# Patient Record
Sex: Male | Born: 1940 | Race: Black or African American | Hispanic: No | Marital: Married | Smoking: Former smoker
Health system: Southern US, Community
[De-identification: ages and names within clinical notes are randomized; demographics above are authoritative.]

## PROBLEM LIST (undated history)

## (undated) DIAGNOSIS — D649 Anemia, unspecified: Secondary | ICD-10-CM

## (undated) DIAGNOSIS — J189 Pneumonia, unspecified organism: Secondary | ICD-10-CM

## (undated) DIAGNOSIS — G4730 Sleep apnea, unspecified: Secondary | ICD-10-CM

## (undated) DIAGNOSIS — I499 Cardiac arrhythmia, unspecified: Secondary | ICD-10-CM

## (undated) DIAGNOSIS — I509 Heart failure, unspecified: Secondary | ICD-10-CM

## (undated) DIAGNOSIS — Z95810 Presence of automatic (implantable) cardiac defibrillator: Secondary | ICD-10-CM

## (undated) DIAGNOSIS — K219 Gastro-esophageal reflux disease without esophagitis: Secondary | ICD-10-CM

## (undated) DIAGNOSIS — Z950 Presence of cardiac pacemaker: Secondary | ICD-10-CM

## (undated) DIAGNOSIS — N189 Chronic kidney disease, unspecified: Secondary | ICD-10-CM

## (undated) HISTORY — PX: PERIPHERALLY INSERTED CENTRAL CATHETER INSERTION: SHX2221

## (undated) HISTORY — PX: INSERT / REPLACE / REMOVE PACEMAKER: SUR710

## (undated) HISTORY — PX: COLON SURGERY: SHX602

---

## 2012-02-12 DIAGNOSIS — I509 Heart failure, unspecified: Secondary | ICD-10-CM

## 2012-02-13 DIAGNOSIS — I5023 Acute on chronic systolic (congestive) heart failure: Secondary | ICD-10-CM

## 2012-02-14 ENCOUNTER — Encounter (HOSPITAL_COMMUNITY): Payer: Self-pay | Admitting: *Deleted

## 2012-02-14 ENCOUNTER — Encounter (HOSPITAL_COMMUNITY)
Admission: EM | Disposition: A | Discharge status: Home Health | Payer: Self-pay | Source: Other Acute Inpatient Hospital | Attending: Cardiology

## 2012-02-14 ENCOUNTER — Other Ambulatory Visit: Payer: Self-pay | Admitting: Physician Assistant

## 2012-02-14 ENCOUNTER — Inpatient Hospital Stay (HOSPITAL_COMMUNITY): Payer: Medicare Other

## 2012-02-14 ENCOUNTER — Inpatient Hospital Stay (HOSPITAL_COMMUNITY)
Admission: EM | Admit: 2012-02-14 | Discharge: 2012-02-22 | DRG: 237 | Disposition: A | Discharge status: Home Health | Payer: Medicare Other | Source: Other Acute Inpatient Hospital | Attending: Cardiology | Admitting: Cardiology

## 2012-02-14 DIAGNOSIS — I509 Heart failure, unspecified: Secondary | ICD-10-CM

## 2012-02-14 DIAGNOSIS — I5189 Other ill-defined heart diseases: Secondary | ICD-10-CM | POA: Diagnosis present

## 2012-02-14 DIAGNOSIS — R570 Cardiogenic shock: Secondary | ICD-10-CM

## 2012-02-14 DIAGNOSIS — R791 Abnormal coagulation profile: Secondary | ICD-10-CM | POA: Diagnosis present

## 2012-02-14 DIAGNOSIS — I472 Ventricular tachycardia, unspecified: Secondary | ICD-10-CM | POA: Diagnosis present

## 2012-02-14 DIAGNOSIS — I5023 Acute on chronic systolic (congestive) heart failure: Principal | ICD-10-CM

## 2012-02-14 DIAGNOSIS — I428 Other cardiomyopathies: Secondary | ICD-10-CM | POA: Diagnosis present

## 2012-02-14 DIAGNOSIS — D509 Iron deficiency anemia, unspecified: Secondary | ICD-10-CM | POA: Diagnosis present

## 2012-02-14 DIAGNOSIS — Z7901 Long term (current) use of anticoagulants: Secondary | ICD-10-CM

## 2012-02-14 DIAGNOSIS — I4891 Unspecified atrial fibrillation: Secondary | ICD-10-CM

## 2012-02-14 DIAGNOSIS — Z7982 Long term (current) use of aspirin: Secondary | ICD-10-CM

## 2012-02-14 DIAGNOSIS — I079 Rheumatic tricuspid valve disease, unspecified: Secondary | ICD-10-CM | POA: Diagnosis present

## 2012-02-14 DIAGNOSIS — K219 Gastro-esophageal reflux disease without esophagitis: Secondary | ICD-10-CM | POA: Diagnosis present

## 2012-02-14 DIAGNOSIS — Z95810 Presence of automatic (implantable) cardiac defibrillator: Secondary | ICD-10-CM

## 2012-02-14 DIAGNOSIS — I4729 Other ventricular tachycardia: Secondary | ICD-10-CM | POA: Diagnosis present

## 2012-02-14 DIAGNOSIS — Z79899 Other long term (current) drug therapy: Secondary | ICD-10-CM

## 2012-02-14 DIAGNOSIS — N182 Chronic kidney disease, stage 2 (mild): Secondary | ICD-10-CM | POA: Diagnosis present

## 2012-02-14 HISTORY — PX: RIGHT HEART CATHETERIZATION: SHX5447

## 2012-02-14 HISTORY — DX: Chronic kidney disease, unspecified: N18.9

## 2012-02-14 HISTORY — DX: Presence of cardiac pacemaker: Z95.0

## 2012-02-14 HISTORY — DX: Presence of automatic (implantable) cardiac defibrillator: Z95.810

## 2012-02-14 HISTORY — DX: Gastro-esophageal reflux disease without esophagitis: K21.9

## 2012-02-14 HISTORY — DX: Anemia, unspecified: D64.9

## 2012-02-14 HISTORY — DX: Cardiac arrhythmia, unspecified: I49.9

## 2012-02-14 LAB — CBC WITH DIFFERENTIAL/PLATELET
Basophils Relative: 0 % (ref 0–1)
HCT: 35.2 % — ABNORMAL LOW (ref 39.0–52.0)
Hemoglobin: 12.2 g/dL — ABNORMAL LOW (ref 13.0–17.0)
Lymphs Abs: 1.9 10*3/uL (ref 0.7–4.0)
MCH: 30.1 pg (ref 26.0–34.0)
MCHC: 34.7 g/dL (ref 30.0–36.0)
Monocytes Absolute: 1 10*3/uL (ref 0.1–1.0)
Monocytes Relative: 14 % — ABNORMAL HIGH (ref 3–12)
Neutro Abs: 3.9 10*3/uL (ref 1.7–7.7)

## 2012-02-14 LAB — CARBOXYHEMOGLOBIN
Carboxyhemoglobin: 1 % (ref 0.5–1.5)
Carboxyhemoglobin: 1.8 % — ABNORMAL HIGH (ref 0.5–1.5)
Methemoglobin: 0.9 % (ref 0.0–1.5)
O2 Saturation: 27.4 %
O2 Saturation: 74.1 %

## 2012-02-14 LAB — COMPREHENSIVE METABOLIC PANEL
Albumin: 3.1 g/dL — ABNORMAL LOW (ref 3.5–5.2)
BUN: 49 mg/dL — ABNORMAL HIGH (ref 6–23)
Chloride: 94 mEq/L — ABNORMAL LOW (ref 96–112)
Creatinine, Ser: 1.46 mg/dL — ABNORMAL HIGH (ref 0.50–1.35)
GFR calc Af Amer: 55 mL/min — ABNORMAL LOW (ref >90)
GFR calc non Af Amer: 47 mL/min — ABNORMAL LOW (ref >90)
Glucose, Bld: 106 mg/dL — ABNORMAL HIGH (ref 70–99)
Total Bilirubin: 1.6 mg/dL — ABNORMAL HIGH (ref 0.3–1.2)

## 2012-02-14 LAB — MAGNESIUM: Magnesium: 1.5 mg/dL (ref 1.5–2.5)

## 2012-02-14 LAB — PRO B NATRIURETIC PEPTIDE: Pro B Natriuretic peptide (BNP): 22343 pg/mL — ABNORMAL HIGH (ref 0–125)

## 2012-02-14 LAB — MRSA PCR SCREENING: MRSA by PCR: NEGATIVE

## 2012-02-14 LAB — TYPE AND SCREEN

## 2012-02-14 SURGERY — RIGHT HEART CATH
Anesthesia: LOCAL

## 2012-02-14 MED ORDER — LIDOCAINE HCL (PF) 1 % IJ SOLN
INTRAMUSCULAR | Status: AC
  Filled 2012-02-14: qty 30

## 2012-02-14 MED ORDER — SODIUM CHLORIDE 0.9 % IJ SOLN: 3.0000 mL | Freq: Two times a day (BID) | INTRAMUSCULAR | Status: DC

## 2012-02-14 MED ORDER — DIGOXIN 125 MCG PO TABS
0.1250 mg | ORAL_TABLET | Freq: Every day | ORAL | Status: DC
  Administered 2012-02-14 – 2012-02-22 (×9): 0.125 mg via ORAL
  Filled 2012-02-14 (×9): qty 1

## 2012-02-14 MED ORDER — FUROSEMIDE 10 MG/ML IJ SOLN: 40.0000 mg | Freq: Two times a day (BID) | INTRAMUSCULAR | Status: DC

## 2012-02-14 MED ORDER — ONDANSETRON HCL 4 MG/2ML IJ SOLN
4.0000 mg | Freq: Four times a day (QID) | INTRAMUSCULAR | Status: DC | PRN
  Administered 2012-02-19: 4 mg via INTRAVENOUS
  Filled 2012-02-14: qty 2

## 2012-02-14 MED ORDER — SODIUM CHLORIDE 0.9 % IV SOLN: 250.0000 mL | INTRAVENOUS | Status: DC | PRN

## 2012-02-14 MED ORDER — DIAZEPAM 5 MG PO TABS: 5.0000 mg | ORAL_TABLET | ORAL | Status: DC

## 2012-02-14 MED ORDER — SODIUM CHLORIDE 0.9 % IJ SOLN
3.0000 mL | Freq: Two times a day (BID) | INTRAMUSCULAR | Status: DC
  Administered 2012-02-16 – 2012-02-21 (×3): 3 mL via INTRAVENOUS

## 2012-02-14 MED ORDER — SODIUM CHLORIDE 0.9 % IJ SOLN: 3.0000 mL | INTRAMUSCULAR | Status: DC | PRN

## 2012-02-14 MED ORDER — ASPIRIN 81 MG PO CHEW: 324.0000 mg | CHEWABLE_TABLET | ORAL | Status: DC

## 2012-02-14 MED ORDER — PANTOPRAZOLE SODIUM 40 MG PO TBEC
40.0000 mg | DELAYED_RELEASE_TABLET | Freq: Every day | ORAL | Status: DC
  Administered 2012-02-15 – 2012-02-21 (×7): 40 mg via ORAL
  Filled 2012-02-14 (×8): qty 1

## 2012-02-14 MED ORDER — HEPARIN (PORCINE) IN NACL 100-0.45 UNIT/ML-% IJ SOLN
1100.0000 [IU]/h | INTRAMUSCULAR | Status: DC
  Administered 2012-02-14: 500 [IU]/h via INTRAVENOUS
  Filled 2012-02-14 (×2): qty 250

## 2012-02-14 MED ORDER — URSODIOL 300 MG PO CAPS
300.0000 mg | ORAL_CAPSULE | Freq: Two times a day (BID) | ORAL | Status: DC
  Administered 2012-02-14 – 2012-02-22 (×16): 300 mg via ORAL
  Filled 2012-02-14 (×18): qty 1

## 2012-02-14 MED ORDER — ACETAMINOPHEN 325 MG PO TABS: 650.0000 mg | ORAL_TABLET | ORAL | Status: DC | PRN

## 2012-02-14 MED ORDER — BENZONATATE 100 MG PO CAPS
100.0000 mg | ORAL_CAPSULE | Freq: Two times a day (BID) | ORAL | Status: DC | PRN
  Administered 2012-02-17: 100 mg via ORAL
  Filled 2012-02-14: qty 1

## 2012-02-14 MED ORDER — ACETAMINOPHEN 325 MG PO TABS
650.0000 mg | ORAL_TABLET | ORAL | Status: DC | PRN
  Administered 2012-02-15 – 2012-02-18 (×3): 650 mg via ORAL
  Filled 2012-02-14 (×2): qty 2

## 2012-02-14 MED ORDER — MILRINONE IN DEXTROSE 200-5 MCG/ML-% IV SOLN
0.2500 ug/kg/min | INTRAVENOUS | Status: DC
  Administered 2012-02-14 – 2012-02-21 (×11): 0.25 ug/kg/min via INTRAVENOUS
  Filled 2012-02-14 (×13): qty 100

## 2012-02-14 MED ORDER — ASPIRIN EC 81 MG PO TBEC: 81.0000 mg | DELAYED_RELEASE_TABLET | Freq: Every day | ORAL | Status: DC

## 2012-02-14 MED ORDER — ONDANSETRON HCL 4 MG/2ML IJ SOLN: 4.0000 mg | Freq: Four times a day (QID) | INTRAMUSCULAR | Status: DC | PRN

## 2012-02-14 MED ORDER — HEPARIN (PORCINE) IN NACL 2-0.9 UNIT/ML-% IJ SOLN
INTRAMUSCULAR | Status: AC
  Filled 2012-02-14: qty 3000

## 2012-02-14 MED ORDER — SODIUM CHLORIDE 0.9 % IV SOLN: 250.0000 mL | INTRAVENOUS | Status: DC

## 2012-02-14 MED ORDER — FUROSEMIDE 10 MG/ML IJ SOLN
80.0000 mg | Freq: Two times a day (BID) | INTRAMUSCULAR | Status: DC
  Administered 2012-02-14 – 2012-02-15 (×2): 80 mg via INTRAVENOUS
  Filled 2012-02-14 (×3): qty 8

## 2012-02-14 MED ORDER — SODIUM CHLORIDE 0.9 % IV SOLN: INTRAVENOUS | Status: DC

## 2012-02-14 MED ORDER — SENNA 8.6 MG PO TABS
1.0000 | ORAL_TABLET | Freq: Every day | ORAL | Status: DC
Start: 2012-02-15 — End: 2012-02-22
  Administered 2012-02-15 – 2012-02-22 (×8): 8.6 mg via ORAL
  Filled 2012-02-14 (×8): qty 1

## 2012-02-14 MED ORDER — MAGNESIUM OXIDE 400 (241.3 MG) MG PO TABS
400.0000 mg | ORAL_TABLET | Freq: Two times a day (BID) | ORAL | Status: DC
  Administered 2012-02-14 – 2012-02-22 (×16): 400 mg via ORAL
  Filled 2012-02-14 (×18): qty 1

## 2012-02-14 MED ORDER — HEPARIN SODIUM (PORCINE) 1000 UNIT/ML IJ SOLN
INTRAMUSCULAR | Status: AC
  Filled 2012-02-14: qty 1

## 2012-02-14 MED ORDER — FENOFIBRATE 145 MG PO TABS
145.0000 mg | ORAL_TABLET | Freq: Every day | ORAL | Status: DC
  Filled 2012-02-14: qty 1

## 2012-02-14 MED ORDER — PHYTONADIONE 5 MG PO TABS
5.0000 mg | ORAL_TABLET | Freq: Once | ORAL | Status: AC
  Administered 2012-02-14: 5 mg via ORAL
  Filled 2012-02-14: qty 1

## 2012-02-14 MED ORDER — FUROSEMIDE 10 MG/ML IJ SOLN
80.0000 mg | Freq: Two times a day (BID) | INTRAMUSCULAR | Status: DC
  Filled 2012-02-14: qty 8

## 2012-02-14 MED ORDER — ENOXAPARIN SODIUM 40 MG/0.4ML ~~LOC~~ SOLN
40.0000 mg | SUBCUTANEOUS | Status: DC
  Filled 2012-02-14: qty 0.4

## 2012-02-14 MED ORDER — ASPIRIN EC 81 MG PO TBEC
81.0000 mg | DELAYED_RELEASE_TABLET | Freq: Every day | ORAL | Status: DC
  Administered 2012-02-16 – 2012-02-22 (×7): 81 mg via ORAL
  Filled 2012-02-14 (×7): qty 1

## 2012-02-14 MED ORDER — AMIODARONE HCL 200 MG PO TABS
400.0000 mg | ORAL_TABLET | Freq: Every day | ORAL | Status: DC
  Administered 2012-02-15 – 2012-02-22 (×8): 400 mg via ORAL
  Filled 2012-02-14 (×8): qty 2

## 2012-02-14 NOTE — H&P (View-Only) (Signed)
  Patient seen and examined. Dr. Ival Bible not reviewed closely.   He has clear evidence of advanced HF with low output. Unable to stay awake during our discussion. C/o ab pain and nausea. SBP low 80s. Significant MR and S3 on exam.   We discussed options and both him and his wife would be interested in advanced therapies if he were a candidate.   Will plan RHC and possible IABP insertion today. Will likely also need coronary angio at some point when HF more stable.  INR 2.8 so will give vitamin K.   Truman Hayward 4:57 PM

## 2012-02-14 NOTE — H&P (Signed)
NAME:  DIO, GILLER [facility] ROOM: [id number]  UNIT NUMBER:  [id number] LOCATION: 41F [id number] 01 ADM/VISIT DATE:  02/12/2012   ADM Vaughan BrownerKathaleen Grinder:  [id number] DOB: March 05, 1941   REQUESTING PHYSICIAN:  Wende Crease, M.D. with the hospitalist service.  REASON FOR CONSULTATION:  Acute-on-chronic systolic heart failure.  HISTORY OF PRESENT ILLNESS:  Mr. Ramseyer is a 71 year old male, followed by Dr. Daryel November in [place], with a history of dilated nonischemic cardiomyopathy, reportedly diagnosed following evaluation at [facility] back in 2005.  He has no reported history of obstructive coronary artery disease, underwent interval placement of a Guidant defibrillator with reported istory of ventricular tachycardia, and has a left ventricular ejection fraction of approximately 20% to 25% with possibility of apical thrombus based on outside echocardiogram done just recently in June.  I have very limited information, however, it seems that he has been admitted to the hospital in [place] at least 4 or 5 times over the last few months with congestive heart failure exacerbations.  He reports compliance with his medications, tends to present with weakness and shortness of breath, mainly with ambulation.  He denies any chest pain, palpitations or device discharges.  Additional history includes atrial fibrillation which may be chronic, on Coumadin; and episodic acute-on-chronic renal failure, creatinine now 1.4.  He is now admitted to [facility] for the first time with heart failure exacerbation, and we are consulted to assist with his management.  I did review some available records from hospitalization in [place] in June.  Echocardiogram reported a left ventricular ejection fraction of 20% to 25% with possible apical thrombus, ICD lead in place in the right ventricle, moderate tricuspid regurgitation, RVSP not reported, no mention of significant mitral regurgitation.  Left ventricular end diastolic dimension was 59 mm, had  mild-to-moderate left atrial enlargement as well. Labwork from June in [place] showed creatinine up to 1.9 during hospitalization, down to 1.4.  He also had elevation in AST to 49, with normal ALT, supratherapeutic INR of 4.4.  Complicating the picture, are plans for the patient and his wife to move further [place] in [place] in the near future. Reportedly they have established with a cardiologist in that region.  ALLERGIES:  No reported drug allergies.  CURRENT MEDICAL REGIMEN: 1. Amiodarone 400 mg daily. 2. Fenofibrate 145 mg daily. 3. Magnesium oxide 400 mg twice daily. 4. Enteric coated aspirin 81 mg daily. 5. Lisinopril 5 mg daily. 6. Pantoprazole 40 mg daily. 7. Senokot S 1 tablet daily. 8. Lasix 40 mg IV twice daily. 9. Coumadin as per pharmacy.  PAST MEDICAL HISTORY:  Discussed above. 1. Atrial fibrillation may have initially been paroxysmal, seems to have been persistent recently.  Patient states that he has been on Coumadin for several years.   2. Baseline creatinine is in the 1.5 region. 3. There is reported history of ventricular tachycardia, although the patient denies any device shocks.  It is not entirely clear whether he has been on amiodarone for the atrial fibrillation or the ventricular tachycardia. 4. He also has reported chronic anemia with iron deficiency. 5. GERD. 6. Hypercalcemia with history of nephrolithiasis. 7. Previous cholecystitis. 8. Prior partial colon resection with colostomy and subsequently right anastomosis related to reported benign lesion of the colon.  SOCIAL HISTORY:  Patient is married, lives with his wife.  Reportedly is disabled at this time.  No active tobacco or alcohol use.  FAMILY HISTORY:  Reviewed.  Father with history of colon cancer, no obvious premature cardiovascular disease or  clear evidence of familial cardiomyopathy.  Does have a brother with some type of cardiac condition.  REVIEW OF SYSTEMS:  Patient describes fatigue and NYHA  class 3 dyspnea on exertion.  Also has weakness and dizziness, no palpitations or device discharges.  No reported melena or hematochezia.  Reports poor appetite.  Leg edema overall mild recently.  No chest pain.  No syncope.  No fever or chills.  Otherwise reviewed and negative.  PHYSICAL EXAMINATION:  Vital signs:  Temperature 97.6 degrees, heart rate ranging from the 80s to the low 110 range, in atrial fibrillation with intermittent pacing by telemetry.  Systolic blood pressure has been as low as the 80s, up to 100; respirations 20, oxygen saturation is 98% on 2 L nasal cannula, weight is 175 pounds.  General:  This is a chronically ill-appearing male in no acute distress.  HEENT:  Conjunctivae and lids are normal.  Oropharynx is clear with moist mucosa.  Neck:  Supple with elevated JVP to the angle of the jaw.  No loud bruits.  No thyromegaly.  Lungs:  Exhibit decreased, but clear breath sounds, nonlabored breathing.  Cardiac:  Exam reveals a regularly irregular rhythm with indistinct PMI, soft apical systolic murmur, no S3.  Thorax:  Examination finds a device pocket site on the left.  Abdomen:  Soft, somewhat protuberant.  Bowel sounds present.  Extremities:  Exhibit 1+ pitting edema below the knees.  Distal pulses 1+.  Skin:  Warm and dry.  Musculoskeletal:  No kyphosis is noted.  Neuropsychiatric:  Patient is alert and oriented x3.  Affect is somewhat blunted.  LABORATORY DATA:  Glucose 142, BUN 45, creatinine 1.45, AST 35, ALT 32, sodium 134, potassium 4.0, chloride 102 and bicarb 23, magnesium 1.4.  CK 109, CK-MB 2.5, troponin I 0.01, INR 2.1, BNP 1451.   WBCs 5500, hemoglobin 11.7, hematocrit 35.8, platelets 256,000.  IMAGING STUDIES: 1. Chest x-ray reports cardiomegaly, moderate overall, ICD in place with no focal infiltrates or effusions. 2. A 12-lead echocardiogram showed atrial fibrillation with IVCD, intermittent ventricular paced beats, nonspecific ST changes, heart rate  88.  IMPRESSION: 1. Presentation with weakness, exertional fatigue, relative hypotension, concerning for acute-on-chronic systolic heart failure exacerbation with low-output symptomatology.  He has had poor appetite as well, several hospitalizations over the last few months in [place] with similar exacerbations. 2. History of reported dilated nonischemic cardiomyopathy based on previous workup, status post defibrillator placement.  LVEF 20% to 25% by echocardiogram in [place] back in June.  Moderate tricuspid regurgitation reported, although no documentation of pulmonary pressures.  No reported significant degree of mitral regurgitation. 3. Atrial fibrillation, persistent, on Coumadin.  Duration is uncertain.  Not entirely clear whether he is on amiodarone related to this, or for additional reported history of ventricular tachycardia. 4. Chronic renal insufficiency, creatinine 1.4, stage 2 with current glomerular filtration rate of 48 to 58. 5. Possible apical thrombus by echocardiogram done in [place], images not available.  This was not a Definity contrast study.  RECOMMENDATIONS:  I reviewed the available records and current situation with the patient and his wife.  I have explained my concerns about his current presentation and diagnosis, and have recommended further inpatient evaluation by an advanced heart failure specialist with an eye toward objective hemodynamic assessment, potential for inotropic therapy, and further optimization of his medical therapy. They have discussed the situation, and are in agreement with transfer to [facility]. I plan to speak with Dr. Gala Romney about the patient's status. He will  be admitted to the 2900 unit, full set of labs to be repeated. His Coumadin has been on hold since admission to [facility]. An echocardiogram will be obtained as well. Hopefully he can be stabilized, ultimately with discharge and assumption of care by his new cardiology team in  [place]. Patient's wife to provide contact information for this.  __________________________    Nona Dell, M.D.

## 2012-02-14 NOTE — Progress Notes (Signed)
  Patient seen and examined. Dr. McDowell's not reviewed closely.   He has clear evidence of advanced HF with low output. Unable to stay awake during our discussion. C/o ab pain and nausea. SBP low 80s. Significant MR and S3 on exam.   We discussed options and both him and his wife would be interested in advanced therapies if he were a candidate.   Will plan RHC and possible IABP insertion today. Will likely also need coronary angio at some point when HF more stable.  INR 2.8 so will give vitamin K.   Daniel Bensimhon,MD 4:57 PM   

## 2012-02-14 NOTE — CV Procedure (Signed)
Cardiac Cath Procedure Note:  Indication: Cardiogenic shock  Procedures performed:  1) Selective coronary angiography 2) Left heart catheterization 3) IABP placement  Description of procedure:   The risks and indication of the procedure were explained. Consent was signed and placed on the chart. An appropriate timeout was taken prior to the procedure. The right groin was prepped and draped in the routine sterile fashion and anesthetized with 1% local lidocaine.   A 6 FR arterial sheath was placed in the right femoral artery using a modified Seldinger technique. Standard catheters including a JL4 and JR4 were used. After the coronary angios were performed the 6FR sheath was exchanged for the 7.5 FR IABP sheath over a wire. 2000 units of systemic heparin were administered (patient already with therapeutic INR). The IABP was then placed over a wire under fluoroscopic guidance.   Complications:  None apparent  Findings:  Ao Pressure: 79/68 (55) LV Pressure: 82/18 (26) There was no signficant gradient across the aortic valve on pullback.  Left main: Normal   LAD: Trivial plaque  LCX: Normal  RCA: Dominant. normal   Assessment: 1. Cardiogenic shock 2. Normal coronary arteries.  Plan/Discussion:  Continue IABP and milrinone support. Titrate on afterload reducers. Check echo. Consider TEE/DC-CV. Will need evaluation for transplant +/ LVAD.  Gregg [name] 7:22 PM

## 2012-02-14 NOTE — Progress Notes (Signed)
Dr. Shirlee Latch notified of cuff pressures soft with SBP ranging 90-low 100's and balloon pump pressures Sys in 60's with Augmenting in mid 80's. No new orders obtained. Will continue to monitor.

## 2012-02-14 NOTE — Procedures (Addendum)
Central Venous Catheter Insertion Procedure Note Gregg [name] [id number] 1940-11-13  Procedure: Insertion of Pulmonary Artery Catheter Indications: Cardiogenic shock. Hemodynamic monitoring.  Procedure Details Consent: Risks of procedure as well as the alternatives and risks of each were explained to the (patient/caregiver).  Consent for procedure obtained. Time Out: Verified patient identification, verified procedure, site/side was marked, verified correct patient position, special equipment/implants available, medications/allergies/relevent history reviewed, required imaging and test results available.  Performed  Maximum sterile technique was used including antiseptics, cap, gloves, gown, hand hygiene, mask and sheet. Skin prep: Chlorhexidine; local anesthetic administered An 8.13F sheath was placed in the right internal jugular vein using the Seldinger technique. Under fluoroscopic guidance a pulmonary artery catheter was placed in the right pulmonary artery.   Evaluation Blood flow good Complications: No apparent complications Patient did tolerate procedure well. Chest X-ray ordered to verify placement.  CXR: pending.  After placement of the PA cath - pulmonary artery sats 27% and 28%. Confirming low output state.   RA   21 PA 59/45 (52) PCWP 33 Fick 2.1/1.1  Will take to cath lab for placement of IABP. Start milrinone. Will also likely need TEE and DC-CV of AF.   Ivis Henneman 02/14/2012, 6:02 PM

## 2012-02-14 NOTE — Interval H&P Note (Signed)
History and Physical Interval Note:  02/14/2012 6:55 PM  Gregg [name]  has presented today for surgery, with the diagnosis of cardiogenic shock. The various methods of treatment have been discussed with the patient and family. After consideration of risks, benefits and other options for treatment, the patient has consented to  Procedure(s) (LRB): RIGHT HEART CATH (N/A) as a surgical intervention .  [name] history has been reviewed, patient examined, no change in status, stable for surgery.  I have reviewed the patient's chart and labs.  Questions were answered to the patient's satisfaction.     Tirsa Gail

## 2012-02-14 NOTE — Progress Notes (Signed)
ANTICOAGULATION CONSULT NOTE - Initial Consult  Pharmacy Consult for Heparin Indication: atrial fibrillation, IABP   No Known Allergies  Patient Measurements: Height: 5\' 10"  (177.8 cm) Weight: 171 lb 15.3 oz (78 kg) IBW/kg (Calculated) : 73  Heparin Dosing Weight: 78 kg  Vital Signs: Temp: 98.4 F (36.9 C) (08/01 1815) Temp src: Axillary (08/01 1600) BP: 85/68 mmHg (08/01 1815) Pulse Rate: 108  (08/01 1849)  Labs:  Basename 02/14/12 1446  HGB 12.2*  HCT 35.2*  PLT 296  APTT 40*  LABPROT 30.0*  INR 2.81*  HEPARINUNFRC --  CREATININE 1.46*  CKTOTAL --  CKMB --  TROPONINI --    Estimated Creatinine Clearance: 49.3 ml/min (by C-G formula based on Cr of 1.46).   Medical History: Past Medical History  Diagnosis Date  . Dysrhythmia   . ICD (implantable cardiac defibrillator) in place   . GERD (gastroesophageal reflux disease)   . Chronic kidney disease   . Anemia   . Pacemaker     Medications:  Prescriptions prior to admission  Medication Sig Dispense Refill  . amiodarone (PACERONE) 400 MG tablet Take 200 mg by mouth daily.      Marland Kitchen aspirin EC 81 MG tablet Take 81 mg by mouth daily.      . carvedilol (COREG) 6.25 MG tablet Take 6.25 mg by mouth 2 (two) times daily with a meal.      . cetirizine (ZYRTEC) 10 MG tablet Take 10 mg by mouth daily.      Marland Kitchen docusate sodium (COLACE) 100 MG capsule Take 200 mg by mouth daily.      . Fe Fum-FA-B Cmp-C-Zn-Mg-Mn-Cu (HEMATINIC PLUS VIT/MINERALS PO) Take 1 tablet by mouth daily.      . furosemide (LASIX) 40 MG tablet Take 40 mg by mouth daily.      Marland Kitchen lisinopril (PRINIVIL,ZESTRIL) 5 MG tablet Take 5 mg by mouth daily.      . magnesium oxide (MAG-OX) 400 MG tablet Take 800 mg by mouth daily.      . pantoprazole (PROTONIX) 40 MG tablet Take 40 mg by mouth daily.      Marland Kitchen pyridOXINE (VITAMIN B-6) 100 MG tablet Take 100 mg by mouth daily.      Marland Kitchen senna (SENOKOT) 8.6 MG TABS Take 2 tablets by mouth daily.      . tadalafil (CIALIS) 10  MG tablet Take 10 mg by mouth daily as needed.       . thiamine (VITAMIN B-1) 100 MG tablet Take 100 mg by mouth daily.      . ursodiol (ACTIGALL) 300 MG capsule Take 300 mg by mouth 2 (two) times daily.      Marland Kitchen warfarin (COUMADIN) 3 MG tablet Take 3 mg by mouth daily. 3mg  on Monday,tuesday,wednesday,friday,saturday      . warfarin (COUMADIN) 5 MG tablet Take 5 mg by mouth daily. 5mg  on Thursday and sunday        Assessment: 71 yo male transferred from [facility] and admitted with weakness and fatigue. Patient has had several hospital admissions over the last few months due to CHF exacerbations. Heart Failure service is following and patient is s/p RHC with insertion of PAC and IABP for cardiogenic shock.  Pharmacy consulted to continue heparin at 500 units/hr until the INR is less than 2, then heparin can be increased to full dose. INR is 2.81 currently. H/H are low and platelets are normal. No bleeding noted.  Goal of Therapy:  Heparin level 0.2-0.5 units/ml  Monitor  platelets by anticoagulation protocol: Yes   Plan:  -Continue heparin drip at 500 units/hr -Daily heparin level and CBC while on heparin -INR daily -Monitor for s/sx of bleeding   [facility], [address].D., BCPS Clinical Pharmacist Pager: [phone] 02/14/2012 8:43 PM

## 2012-02-15 ENCOUNTER — Encounter (HOSPITAL_COMMUNITY)
Admission: EM | Disposition: A | Discharge status: Home Health | Payer: Self-pay | Source: Other Acute Inpatient Hospital | Attending: Cardiology

## 2012-02-15 ENCOUNTER — Inpatient Hospital Stay (HOSPITAL_COMMUNITY): Payer: Medicare Other

## 2012-02-15 DIAGNOSIS — I4891 Unspecified atrial fibrillation: Secondary | ICD-10-CM

## 2012-02-15 DIAGNOSIS — R570 Cardiogenic shock: Secondary | ICD-10-CM

## 2012-02-15 DIAGNOSIS — I319 Disease of pericardium, unspecified: Secondary | ICD-10-CM

## 2012-02-15 LAB — CBC
Platelets: 245 10*3/uL (ref 150–400)
RDW: 15.8 % — ABNORMAL HIGH (ref 11.5–15.5)
WBC: 6 10*3/uL (ref 4.0–10.5)

## 2012-02-15 LAB — PROTIME-INR
INR: 2.41 — ABNORMAL HIGH (ref 0.00–1.49)
INR: 1.78 — ABNORMAL HIGH (ref 0.00–1.49)
Prothrombin Time: 26.6 seconds — ABNORMAL HIGH (ref 11.6–15.2)

## 2012-02-15 LAB — CEA: CEA: 1.1 ng/mL (ref 0.0–5.0)

## 2012-02-15 LAB — CARBOXYHEMOGLOBIN: Methemoglobin: 1 % (ref 0.0–1.5)

## 2012-02-15 LAB — BASIC METABOLIC PANEL
CO2: 29 mEq/L (ref 19–32)
Glucose, Bld: 86 mg/dL (ref 70–99)
Potassium: 3.5 mEq/L (ref 3.5–5.1)
Sodium: 138 mEq/L (ref 135–145)

## 2012-02-15 LAB — HEPARIN LEVEL (UNFRACTIONATED): Heparin Unfractionated: <0.1 IU/mL — ABNORMAL LOW (ref 0.30–0.70)

## 2012-02-15 LAB — TSH: TSH: 7.231 u[IU]/mL — ABNORMAL HIGH (ref 0.350–4.500)

## 2012-02-15 SURGERY — LEFT AND RIGHT HEART CATHETERIZATION WITH CORONARY ANGIOGRAM
Anesthesia: LOCAL

## 2012-02-15 MED ORDER — FENOFIBRATE 160 MG PO TABS
160.0000 mg | ORAL_TABLET | Freq: Every day | ORAL | Status: DC
  Administered 2012-02-15 – 2012-02-22 (×8): 160 mg via ORAL
  Filled 2012-02-15 (×8): qty 1

## 2012-02-15 MED ORDER — FUROSEMIDE 40 MG PO TABS: 40.0000 mg | ORAL_TABLET | Freq: Every day | ORAL | Status: DC

## 2012-02-15 MED ORDER — HYDRALAZINE HCL 25 MG PO TABS
12.5000 mg | ORAL_TABLET | Freq: Three times a day (TID) | ORAL | Status: DC
  Administered 2012-02-15 (×3): 12.5 mg via ORAL
  Filled 2012-02-15 (×10): qty 0.5

## 2012-02-15 MED ORDER — BIOTENE DRY MOUTH MT LIQD
15.0000 mL | Freq: Two times a day (BID) | OROMUCOSAL | Status: DC
  Administered 2012-02-15 – 2012-02-22 (×12): 15 mL via OROMUCOSAL

## 2012-02-15 MED ORDER — SODIUM CHLORIDE 0.45 % IV SOLN: INTRAVENOUS | Status: DC

## 2012-02-15 MED ORDER — DEXTROSE 5 % IV SOLN
5.0000 mg | Freq: Once | INTRAVENOUS | Status: AC
  Administered 2012-02-15: 5 mg via INTRAVENOUS
  Filled 2012-02-15: qty 0.5

## 2012-02-15 MED ORDER — POTASSIUM CHLORIDE CRYS ER 20 MEQ PO TBCR
40.0000 meq | EXTENDED_RELEASE_TABLET | Freq: Every day | ORAL | Status: DC
  Administered 2012-02-15 – 2012-02-22 (×8): 40 meq via ORAL
  Filled 2012-02-15 (×8): qty 2

## 2012-02-15 MED ORDER — SODIUM CHLORIDE 0.9 % IJ SOLN: 3.0000 mL | INTRAMUSCULAR | Status: DC | PRN

## 2012-02-15 MED ORDER — SODIUM CHLORIDE 0.9 % IV SOLN
250.0000 mL | INTRAVENOUS | Status: DC
  Administered 2012-02-15: 1000 mL via INTRAVENOUS
  Administered 2012-02-17: 23:00:00 via INTRAVENOUS

## 2012-02-15 MED ORDER — POTASSIUM CHLORIDE CRYS ER 20 MEQ PO TBCR: 40.0000 meq | EXTENDED_RELEASE_TABLET | Freq: Once | ORAL | Status: DC

## 2012-02-15 MED ORDER — SODIUM CHLORIDE 0.9 % IV SOLN
250.0000 mL | INTRAVENOUS | Status: DC | PRN
  Administered 2012-02-19: 250 mL via INTRAVENOUS

## 2012-02-15 MED ORDER — SODIUM CHLORIDE 0.9 % IV SOLN
INTRAVENOUS | Status: DC
  Administered 2012-02-15 – 2012-02-17 (×2): via INTRAVENOUS

## 2012-02-15 MED ORDER — FUROSEMIDE 40 MG PO TABS
40.0000 mg | ORAL_TABLET | Freq: Every day | ORAL | Status: DC
  Administered 2012-02-16 – 2012-02-17 (×2): 40 mg via ORAL
  Filled 2012-02-15 (×2): qty 1

## 2012-02-15 MED ORDER — SODIUM CHLORIDE 0.9 % IJ SOLN: 3.0000 mL | Freq: Two times a day (BID) | INTRAMUSCULAR | Status: DC

## 2012-02-15 NOTE — Progress Notes (Signed)
Gregg [name] notified of CXR results. No new orders received at this time. She will notify Dr Jones Broom. Pt in no distress. VSS.

## 2012-02-15 NOTE — Progress Notes (Signed)
ANTICOAGULATION CONSULT NOTE - Follow Up Consult  Pharmacy Consult for heparin Indication: Afib/IABP  Labs:  Basename 02/15/12 0425 02/14/12 1446  HGB 10.3* 12.2*  HCT 30.4* 35.2*  PLT 245 296  APTT -- 40*  LABPROT 26.6* 30.0*  INR 2.41* 2.81*  HEPARINUNFRC <0.10* --  CREATININE 1.29 1.46*  CKTOTAL -- --  CKMB -- --  TROPONINI -- --    Assessment: 71yo male undetectable on heparin after started for Afib with IABP in place.  Goal of Therapy:  Heparin level 0.2-0.5 units/ml   Plan:  Will increase heparin gtt modestly by 2 units/kg/hr to 650 units/hr and check level in 6hr.  Colleen Can PharmD BCPS 02/15/2012,6:04 AM

## 2012-02-15 NOTE — Progress Notes (Signed)
Echocardiogram 2D Echocardiogram with Definity has been performed.  [name] 02/15/2012, 12:50 PM

## 2012-02-15 NOTE — Progress Notes (Signed)
Advanced Heart Failure Rounding Note   Subjective:   Gregg [name] is a 71 year old male, followed by Dr. Daryel November in [place], with a history of dilated nonischemic cardiomyopathy, reportedly diagnosed following evaluation at [facility] back in 2005. He has no reported history of obstructive coronary artery disease, underwent interval placement of a Guidant defibrillator with reported istory of ventricular tachycardia, and has a left ventricular ejection fraction of approximately 20% to 25% with possibility of apical thrombus based on outside echocardiogram done just recently in June. Additional history includes atrial fibrillation which may be chronic, on Coumadin; and episodic acute-on-chronic renal failure, creatinine now 1.4. He was admitted to [facility] for the first time with heart failure exacerbation.     02/14/12 RHC/ LHC Normal coronary arteries.  Ao Pressure: 79/68 (55)  LV Pressure: 82/18 (26)  Pulmonary artery sats 27% and 28% RA 21 PA 59/45 (52)  PCWP 33  Fick 2.1/1.1  Admitted from [facility] for advanced heart failure management. Yesterday IABP inserted and Milrinone started 0.25 mcg.   CO-OX 28.3>27.4>74.1>83.9  Feels much better. Denies SOB/PND/orthopnea CP PA 22/14 (17) PCWP 17 SVR 2197 CO/CI 3.781.93 CVP 9  Objective:    Vital Signs:   Temp:  [97.2 F (36.2 C)-98.4 F (36.9 C)] 97.3 F (36.3 C) (08/02 0800) Pulse Rate:  [39-108] 73  (08/02 0800) Resp:  [13-26] 17  (08/02 0800) BP: (81-121)/(37-73) 109/40 mmHg (08/02 0800) SpO2:  [97 %-100 %] 99 % (08/02 0800) Weight:  [78 kg (171 lb 15.3 oz)-79.8 kg (175 lb 14.8 oz)] 79.8 kg (175 lb 14.8 oz) (08/02 0500) Last BM Date: 02/14/12  Weight change: Filed Weights   02/14/12 1252 02/15/12 0500  Weight: 78 kg (171 lb 15.3 oz) 79.8 kg (175 lb 14.8 oz)    Intake/Output:   Intake/Output Summary (Last 24 hours) at 02/15/12 0914 Last data filed at 02/15/12 0834  Gross per 24 hour  Intake 1017.48 ml    Output   2120 ml  Net -1102.52 ml    CVP 4-5 Physical Exam: General:  Lying flat. IABP in place. No distress.  Neck: supple. RIJ swan in place. Carotids 2+ bilat; no bruits. No lymphadenopathy or thryomegaly appreciated. Cor: PMI laterally displaced. Regular rate & rhythm.3/6 MR. +s3 Lungs: clear Abdomen: soft, nontender, nondistended. No hepatosplenomegaly. No bruits or masses. Good bowel sounds. Extremities: no cyanosis, clubbing, rash, edema R groin IABP. R and L pedal pulse 3+ Neuro: alert & orientedx3, cranial nerves grossly intact. moves all 4 extremities w/o difficulty. Affect pleasant  Telemetry: A-fib 70-80s  Labs: Basic Metabolic Panel:  Lab 02/15/12 1610 02/14/12 1446  NA 138 132*  K 3.5 4.2  CL 98 94*  CO2 29 24  GLUCOSE 86 106*  BUN 43* 49*  CREATININE 1.29 1.46*  CALCIUM 10.7* 11.8*  MG -- 1.5  PHOS -- --    Liver Function Tests:  Lab 02/14/12 1446  AST 45*  ALT 38  ALKPHOS 77  BILITOT 1.6*  PROT 6.6  ALBUMIN 3.1*   No results found for this basename: LIPASE:5,AMYLASE:5 in the last 168 hours No results found for this basename: AMMONIA:3 in the last 168 hours  CBC:  Lab 02/15/12 0425 02/14/12 1446  WBC 6.0 6.9  NEUTROABS -- 3.9  HGB 10.3* 12.2*  HCT 30.4* 35.2*  MCV 86.1 86.9  PLT 245 296    Cardiac Enzymes: No results found for this basename: CKTOTAL:5,CKMB:5,CKMBINDEX:5,TROPONINI:5 in the last 168 hours  BNP: BNP (last 3 results)  Basename 02/14/12  1446  PROBNP 22343.0*     Other results:    Imaging: Dg Chest Port 1 View  02/15/2012  *RADIOLOGY REPORT*  Clinical Data: Swan-Ganz catheter placement  PORTABLE CHEST - 1 VIEW  Comparison: Yesterday  Findings: Swan-Ganz catheter tip has been advanced and the tip projects over the right lower lobe.  Aortic balloon pump tip is in the proximal descending aorta.  Stable AICD device, cardiomegaly. Vascular congestion improved.  IMPRESSION: Aortic balloon pump placed.  Swan-Ganz catheter  should be retracted.  Improved vascular congestion.  Original Report Authenticated By: Donavan Burnet, M.D.   Dg Chest Port 1 View  02/14/2012  *RADIOLOGY REPORT*  Clinical Data: Swan-Ganz placement.  PORTABLE CHEST - 1 VIEW  Comparison: 02/12/2012  Findings: Interval placement of right Swan-Ganz catheter.  The tip is in the descending right pulmonary artery.  Left pacer is in place, unchanged.  No pneumothorax.  Cardiomegaly with mild to moderate pulmonary edema, progressed since prior study.  IMPRESSION: Worsening pulmonary edema pattern.  Swan-Ganz catheter in the descending right pulmonary artery.  No pneumothorax.  Original Report Authenticated By: Cyndie Chime, M.D.   Dg Fluoro Guide Cv Line-no Report  02/14/2012  CLINICAL DATA: pulmonary artery catheter placement   FLOURO GUIDE CV LINE  Fluoroscopy was utilized by the requesting physician.  No radiographic  interpretation.       Medications:     Scheduled Medications:    . amiodarone  400 mg Oral Daily  . aspirin EC  81 mg Oral Daily  . digoxin  0.125 mg Oral Daily  . fenofibrate  160 mg Oral Daily  . furosemide  80 mg Intravenous BID  . furosemide  40 mg Oral Daily  . heparin      . heparin      . hydrALAZINE  12.5 mg Oral Q8H  . lidocaine      . magnesium oxide  400 mg Oral BID  . pantoprazole  40 mg Oral Q1200  . phytonadione (VITAMIN K) IV  5 mg Intravenous Once  . phytonadione  5 mg Oral Once  . potassium chloride  40 mEq Oral Daily  . senna  1 tablet Oral Daily  . sodium chloride  3 mL Intravenous Q12H  . ursodiol  300 mg Oral BID  . DISCONTD: aspirin  324 mg Oral Pre-Cath  . DISCONTD: aspirin EC  81 mg Oral Daily  . DISCONTD: diazepam  5 mg Oral On Call  . DISCONTD: diazepam  5 mg Oral On Call  . DISCONTD: enoxaparin (LOVENOX) injection  40 mg Subcutaneous Q24H  . DISCONTD: fenofibrate  145 mg Oral Daily  . DISCONTD: furosemide  40 mg Intravenous BID  . DISCONTD: furosemide  80 mg Intravenous BID  . DISCONTD:  potassium chloride  40 mEq Oral Once  . DISCONTD: sodium chloride  3 mL Intravenous Q12H  . DISCONTD: sodium chloride  3 mL Intravenous Q12H  . DISCONTD: sodium chloride  3 mL Intravenous Q12H    Infusions:    . sodium chloride    . heparin 500 Units/hr (02/15/12 0837)  . milrinone 0.25 mcg/kg/min (02/15/12 0654)  . DISCONTD: sodium chloride    . DISCONTD: sodium chloride      PRN Medications: acetaminophen, benzonatate, ondansetron (ZOFRAN) IV, sodium chloride, DISCONTD: sodium chloride, DISCONTD: sodium chloride, DISCONTD: sodium chloride, DISCONTD: acetaminophen, DISCONTD: ondansetron (ZOFRAN) IV, DISCONTD: ondansetron (ZOFRAN) IV, DISCONTD: sodium chloride, DISCONTD: sodium chloride, DISCONTD: sodium chloride   Assessment:  1. Cardiogenic Shock 2. A/C  systolic heart failure 3. A-fib - on chronic coumadin (has been in AF since January per patient report) 4. CKD (baseline 1.4) 5. Possible Apical thrombus 6. NICM with normal cors cath 02/14/12. EF 20%  Plan/Discussion:    Much improved on IABP and milrinone. Will continue current support. Attempt to titrate on hydralazine/nitrates. Consider TEE/ DC-CV to restore NSR to see if this will help wean IABP.  TTE to look at RV and asses for apical clot.   Will need double lumen PICC today for inotropes and CVP.   Will continue IABP for at least 24 more hours then consider wean and try to maintain on milrinone. Will need evaluation for transplant/VAD. (blood type B+) If unable to wean IABP will need inpatient transfer to [facility].  Start Hydralazine 12.5 mg TID for afterload reduction. Stop IV lasix and start Lasix 40 mg po daily. Supplement potassium.   The patient is critically ill with multiple organ systems failure and requires high complexity decision making for assessment and support, frequent evaluation and titration of therapies, application of advanced monitoring technologies and extensive interpretation of multiple databases.    Critical Care Time devoted to patient care services described in this note is 35 Minutes.   Length of Stay: 1   Arvilla Meres MD 02/15/2012, 9:14 AM

## 2012-02-15 NOTE — Progress Notes (Signed)
ANTICOAGULATION CONSULT NOTE - Initial Consult  Pharmacy Consult for Heparin Indication: atrial fibrillation, IABP   No Known Allergies  Patient Measurements: Height: 5\' 10"  (177.8 cm) Weight: 175 lb 14.8 oz (79.8 kg) IBW/kg (Calculated) : 73  Heparin Dosing Weight: 78 kg  Vital Signs: Temp: 97.3 F (36.3 C) (08/02 0800) BP: 109/40 mmHg (08/02 0800) Pulse Rate: 73  (08/02 0800)  Labs:  Basename 02/15/12 0425 02/14/12 1446  HGB 10.3* 12.2*  HCT 30.4* 35.2*  PLT 245 296  APTT -- 40*  LABPROT 26.6* 30.0*  INR 2.41* 2.81*  HEPARINUNFRC <0.10* --  CREATININE 1.29 1.46*  CKTOTAL -- --  CKMB -- --  TROPONINI -- --    Estimated Creatinine Clearance: 55.8 ml/min (by C-G formula based on Cr of 1.29).   Medical History: Past Medical History  Diagnosis Date  . Dysrhythmia   . ICD (implantable cardiac defibrillator) in place   . GERD (gastroesophageal reflux disease)   . Chronic kidney disease   . Anemia   . Pacemaker     Medications:  Prescriptions prior to admission  Medication Sig Dispense Refill  . amiodarone (PACERONE) 400 MG tablet Take 200 mg by mouth daily.      Marland Kitchen aspirin EC 81 MG tablet Take 81 mg by mouth daily.      . carvedilol (COREG) 6.25 MG tablet Take 6.25 mg by mouth 2 (two) times daily with a meal.      . cetirizine (ZYRTEC) 10 MG tablet Take 10 mg by mouth daily.      Marland Kitchen docusate sodium (COLACE) 100 MG capsule Take 200 mg by mouth daily.      . Fe Fum-FA-B Cmp-C-Zn-Mg-Mn-Cu (HEMATINIC PLUS VIT/MINERALS PO) Take 1 tablet by mouth daily.      . furosemide (LASIX) 40 MG tablet Take 40 mg by mouth daily.      Marland Kitchen lisinopril (PRINIVIL,ZESTRIL) 5 MG tablet Take 5 mg by mouth daily.      . magnesium oxide (MAG-OX) 400 MG tablet Take 800 mg by mouth daily.      . pantoprazole (PROTONIX) 40 MG tablet Take 40 mg by mouth daily.      Marland Kitchen pyridOXINE (VITAMIN B-6) 100 MG tablet Take 100 mg by mouth daily.      Marland Kitchen senna (SENOKOT) 8.6 MG TABS Take 2 tablets by mouth  daily.      . tadalafil (CIALIS) 10 MG tablet Take 10 mg by mouth daily as needed.       . thiamine (VITAMIN B-1) 100 MG tablet Take 100 mg by mouth daily.      . ursodiol (ACTIGALL) 300 MG capsule Take 300 mg by mouth 2 (two) times daily.      Marland Kitchen warfarin (COUMADIN) 3 MG tablet Take 3 mg by mouth daily. 3mg  on Monday,tuesday,wednesday,friday,saturday      . warfarin (COUMADIN) 5 MG tablet Take 5 mg by mouth daily. 5mg  on Thursday and sunday        Assessment: 71 yo male transferred from [facility] and admitted with weakness and fatigue. Patient has had several hospital admissions over the last few months due to CHF exacerbations. Heart Failure service is following and patient is s/p RHC with insertion of PAC and IABP for cardiogenic shock.  Pharmacy consulted to continue heparin at 500 units/hr until the INR is less than 2, then heparin can be increased to full dose. INR is 2.41 currently. H/H are trending down, though no [place] bleeding noted.    Goal  of Therapy:  Heparin level 0.2-0.5 units/ml, but to maintain 500 units/hr until INR <2  Monitor platelets by anticoagulation protocol: Yes   Plan:  -Continue heparin drip at 500 units/hr - Give Vitamin K 5 mg IV x 1 now, patient is now 3 days without warfarin and likely due to low perfusion INR will come down VERY slowly.  Due to high risk of bleeding -Repeat INR and HL 12h after vitamin K -Daily heparin level and CBC while on heparin -INR daily -Monitor for s/sx of bleeding   Thank you for allowing pharmacy to be a part of this patients care team Lovenia Kim Pharm.D., BCPS Clinical Pharmacist 02/15/2012 8:13 AM Pager: [phone] Phone: [phone]

## 2012-02-16 DIAGNOSIS — R570 Cardiogenic shock: Secondary | ICD-10-CM

## 2012-02-16 DIAGNOSIS — I5023 Acute on chronic systolic (congestive) heart failure: Principal | ICD-10-CM

## 2012-02-16 DIAGNOSIS — I4891 Unspecified atrial fibrillation: Secondary | ICD-10-CM

## 2012-02-16 LAB — HEPARIN LEVEL (UNFRACTIONATED): Heparin Unfractionated: <0.1 IU/mL — ABNORMAL LOW (ref 0.30–0.70)

## 2012-02-16 LAB — BASIC METABOLIC PANEL
BUN: 27 mg/dL — ABNORMAL HIGH (ref 6–23)
Calcium: 10.2 mg/dL (ref 8.4–10.5)
GFR calc non Af Amer: 83 mL/min — ABNORMAL LOW (ref >90)
Glucose, Bld: 58 mg/dL — ABNORMAL LOW (ref 70–99)
Sodium: 140 mEq/L (ref 135–145)

## 2012-02-16 LAB — PROTIME-INR
INR: 1.65 — ABNORMAL HIGH (ref 0.00–1.49)
Prothrombin Time: 19.8 seconds — ABNORMAL HIGH (ref 11.6–15.2)

## 2012-02-16 LAB — CARBOXYHEMOGLOBIN
Methemoglobin: 1 % (ref 0.0–1.5)
O2 Saturation: 81.3 %
Total hemoglobin: 11.2 g/dL — ABNORMAL LOW (ref 13.5–18.0)

## 2012-02-16 LAB — CBC
MCH: 28.6 pg (ref 26.0–34.0)
MCHC: 33 g/dL (ref 30.0–36.0)
Platelets: 214 10*3/uL (ref 150–400)

## 2012-02-16 LAB — POCT ACTIVATED CLOTTING TIME: Activated Clotting Time: 149 seconds

## 2012-02-16 MED ORDER — HEPARIN (PORCINE) IN NACL 100-0.45 UNIT/ML-% IJ SOLN
1400.0000 [IU]/h | INTRAMUSCULAR | Status: DC
  Administered 2012-02-16: 1100 [IU]/h via INTRAVENOUS
  Administered 2012-02-17 – 2012-02-18 (×3): 1400 [IU]/h via INTRAVENOUS
  Administered 2012-02-19 – 2012-02-20 (×3): 1300 [IU]/h via INTRAVENOUS
  Administered 2012-02-21: 1400 [IU]/h via INTRAVENOUS
  Filled 2012-02-16 (×13): qty 250

## 2012-02-16 MED ORDER — ISOSORBIDE MONONITRATE ER 30 MG PO TB24
30.0000 mg | ORAL_TABLET | Freq: Every day | ORAL | Status: DC
  Administered 2012-02-16 – 2012-02-17 (×2): 30 mg via ORAL
  Filled 2012-02-16 (×3): qty 1

## 2012-02-16 MED ORDER — ATROPINE SULFATE 1 MG/ML IJ SOLN
INTRAMUSCULAR | Status: AC
  Filled 2012-02-16: qty 1

## 2012-02-16 MED ORDER — HYDRALAZINE HCL 25 MG PO TABS
25.0000 mg | ORAL_TABLET | Freq: Three times a day (TID) | ORAL | Status: DC
  Administered 2012-02-16 – 2012-02-17 (×5): 25 mg via ORAL
  Filled 2012-02-16 (×7): qty 1

## 2012-02-16 NOTE — Progress Notes (Signed)
Advanced Heart Failure Rounding Note   Subjective:   Gregg [name] is a 71 year old male, followed by Dr. Daryel November in [place], with a history of dilated nonischemic cardiomyopathy, reportedly diagnosed following evaluation at [facility] back in 2005. He has no reported history of obstructive coronary artery disease, underwent interval placement of a Guidant defibrillator with reported istory of ventricular tachycardia, and has a left ventricular ejection fraction of approximately 20% to 25% with possibility of apical thrombus based on outside echocardiogram done just recently in June. Additional history includes atrial fibrillation which may be chronic, on Coumadin; and episodic acute-on-chronic renal failure, creatinine now 1.4. He was admitted to [facility] for the first time with heart failure exacerbation.     02/14/12 RHC/ LHC Normal coronary arteries.  Ao Pressure: 79/68 (55)  LV Pressure: 82/18 (26)  Pulmonary artery sats 27% and 28% RA 21 PA 59/45 (52)  PCWP 33  Fick 2.1/1.1  Admitted from [facility] for advanced heart failure management. IABP inserted 8/1 and Milrinone started 0.25 mcg.   CO-OX 28.3>27.4>74.1>83.9  Feels much better. Denies SOB/PND/orthopnea CP. MAPs on IABP running 70s.   Ernestine Conrad numbers checked personally. CVP 3. PCWP 9  Objective:    Vital Signs:   Temp:  [97.2 F (36.2 C)-98.2 F (36.8 C)] 98.2 F (36.8 C) (08/03 0700) Pulse Rate:  [71-80] 80  (08/03 0700) Resp:  [15-22] 22  (08/03 0700) BP: (92-121)/(29-72) 108/52 mmHg (08/03 0700) SpO2:  [97 %-100 %] 98 % (08/03 0700) Weight:  [75.7 kg (166 lb 14.2 oz)] 75.7 kg (166 lb 14.2 oz) (08/03 0437) Last BM Date: 02/14/12  Weight change: Filed Weights   02/14/12 1252 02/15/12 0500 02/16/12 0437  Weight: 78 kg (171 lb 15.3 oz) 79.8 kg (175 lb 14.8 oz) 75.7 kg (166 lb 14.2 oz)    Intake/Output:   Intake/Output Summary (Last 24 hours) at 02/16/12 0719 Last data filed at 02/16/12 0700  Gross  per 24 hour  Intake 1408.1 ml  Output   4005 ml  Net -2596.9 ml    CVP 3 Physical Exam: General:  Lying flat. IABP in place. No distress.  Neck: supple. RIJ swan in place. Carotids 2+ bilat; no bruits. No lymphadenopathy or thryomegaly appreciated. Cor: PMI laterally displaced. Regular rate & rhythm.3/6 MR. +s3 Lungs: clear Abdomen: soft, nontender, nondistended. No hepatosplenomegaly. No bruits or masses. Good bowel sounds. Extremities: no cyanosis, clubbing, rash, edema R groin IABP. R and L pedal pulse 3+ Neuro: alert & orientedx3, cranial nerves grossly intact. moves all 4 extremities w/o difficulty. Affect pleasant  Telemetry: A-fib 70-80s  Labs: Basic Metabolic Panel:  Lab 02/15/12 1610 02/14/12 1446  NA 138 132*  K 3.5 4.2  CL 98 94*  CO2 29 24  GLUCOSE 86 106*  BUN 43* 49*  CREATININE 1.29 1.46*  CALCIUM 10.7* 11.8*  MG -- 1.5  PHOS -- --    Liver Function Tests:  Lab 02/14/12 1446  AST 45*  ALT 38  ALKPHOS 77  BILITOT 1.6*  PROT 6.6  ALBUMIN 3.1*   No results found for this basename: LIPASE:5,AMYLASE:5 in the last 168 hours No results found for this basename: AMMONIA:3 in the last 168 hours  CBC:  Lab 02/15/12 0425 02/14/12 1446  WBC 6.0 6.9  NEUTROABS -- 3.9  HGB 10.3* 12.2*  HCT 30.4* 35.2*  MCV 86.1 86.9  PLT 245 296    Cardiac Enzymes: No results found for this basename: CKTOTAL:5,CKMB:5,CKMBINDEX:5,TROPONINI:5 in the last 168 hours  BNP: BNP (  last 3 results)  Basename 02/14/12 1446  PROBNP 22343.0*     Other results:    Imaging: Dg Chest Port 1 View  02/15/2012  *RADIOLOGY REPORT*  Clinical Data: Evaluate Theone Murdoch catheter placement.  PORTABLE CHEST - 1 VIEW  Comparison: 02/15/2012; 02/14/2012  Findings:  Grossly unchanged enlarged cardiac silhouette and mediastinal contours.  Interval retraction of PA catheter with tip now overlying the main pulmonary artery outflow tract. Grossly unchanged positioning of intra-aortic balloon  pump with tip approximately 2.3 cm from the superior aspect of the aortic arch. Pulmonary venous congestion is grossly unchanged.  No new focal airspace opacities.  No definite pleural effusion or pneumothorax. Grossly unchanged bones.  IMPRESSION: 1.  Interval retraction of PA catheter with tip now overlying the expected location of the main pulmonary artery outflow tract. 2.  Unchanged positioning of IABP with tip approximately 2.3 cm from the superior aspect of the aortic arch. 3.  Persistent findings of pulmonary venous congestion/mild pulmonary edema.  Original Report Authenticated By: Waynard Reeds, M.D.   Dg Chest Port 1 View  02/15/2012  *RADIOLOGY REPORT*  Clinical Data: Swan-Ganz catheter placement  PORTABLE CHEST - 1 VIEW  Comparison: Yesterday  Findings: Swan-Ganz catheter tip has been advanced and the tip projects over the right lower lobe.  Aortic balloon pump tip is in the proximal descending aorta.  Stable AICD device, cardiomegaly. Vascular congestion improved.  IMPRESSION: Aortic balloon pump placed.  Swan-Ganz catheter should be retracted.  Improved vascular congestion.  Original Report Authenticated By: Donavan Burnet, M.D.   Dg Chest Port 1 View  02/14/2012  *RADIOLOGY REPORT*  Clinical Data: Swan-Ganz placement.  PORTABLE CHEST - 1 VIEW  Comparison: 02/12/2012  Findings: Interval placement of right Swan-Ganz catheter.  The tip is in the descending right pulmonary artery.  Left pacer is in place, unchanged.  No pneumothorax.  Cardiomegaly with mild to moderate pulmonary edema, progressed since prior study.  IMPRESSION: Worsening pulmonary edema pattern.  Swan-Ganz catheter in the descending right pulmonary artery.  No pneumothorax.  Original Report Authenticated By: Cyndie Chime, M.D.   Dg Fluoro Guide Cv Line-no Report  02/14/2012  CLINICAL DATA: pulmonary artery catheter placement   FLOURO GUIDE CV LINE  Fluoroscopy was utilized by the requesting physician.  No radiographic   interpretation.       Medications:     Scheduled Medications:    . amiodarone  400 mg Oral Daily  . antiseptic oral rinse  15 mL Mouth Rinse BID  . aspirin EC  81 mg Oral Daily  . digoxin  0.125 mg Oral Daily  . fenofibrate  160 mg Oral Daily  . furosemide  40 mg Oral Daily  . hydrALAZINE  12.5 mg Oral Q8H  . magnesium oxide  400 mg Oral BID  . pantoprazole  40 mg Oral Q1200  . phytonadione (VITAMIN K) IV  5 mg Intravenous Once  . potassium chloride  40 mEq Oral Daily  . senna  1 tablet Oral Daily  . sodium chloride  3 mL Intravenous Q12H  . ursodiol  300 mg Oral BID  . DISCONTD: furosemide  80 mg Intravenous BID  . DISCONTD: furosemide  40 mg Oral Daily  . DISCONTD: potassium chloride  40 mEq Oral Once  . DISCONTD: sodium chloride  3 mL Intravenous Q12H    Infusions:    . sodium chloride    . sodium chloride 1,000 mL (02/15/12 2000)  . sodium chloride 5 mL/hr at  02/15/12 2012  . heparin 950 Units/hr (02/16/12 0040)  . milrinone 0.25 mcg/kg/min (02/16/12 0132)  . DISCONTD: sodium chloride Stopped (02/15/12 2000)    PRN Medications: sodium chloride, acetaminophen, benzonatate, ondansetron (ZOFRAN) IV, sodium chloride, DISCONTD: sodium chloride   Assessment:  1. Cardiogenic Shock 2. A/C systolic heart failure 3. A-fib - on chronic coumadin (has been in AF since January per patient report) 4. CKD (baseline 1.4) 5. Possible Apical thrombus 6. NICM with normal cors cath 02/14/12. EF 20-25% by echo with only mild RV dysfunction  Plan/Discussion:    Dramatic response to IABP and milrinone. Will continue to titrate on hydralazine/nitrates. Await labs. Will re-check co-ox. If > 60% will attempt to wean IABP today and treat with milrinone and oral vasodilators. Keep Swan in over weekend. (once out will need PICC)  Has been in AF since at least January. He has been maintained on amio by his outside team. I was planning to TEE/DC-CV today to help him hemodynamically but  given that we will have to stop anti-coag to remove IABP I am uncomfortable cardioverting him prior to that. Thus will hold on attempted DC-CV for now. Plan later in week.   Will need evaluation for transplant/VAD. (blood type B+) If unable to wean IABP will need inpatient transfer to [facility] but I don't think this will be an issue.   I will be out of town tomorrow. Plan will be to continue to titrate hydral/NTG. Keep in ICU. No b-blocker yet. Call my cell with questions [phone].  The patient is critically ill with multiple organ systems failure and requires high complexity decision making for assessment and support, frequent evaluation and titration of therapies, application of advanced monitoring technologies and extensive interpretation of multiple databases.   Critical Care Time devoted to patient care services described in this note is 35 Minutes.   Length of Stay: 2 Arvilla Meres MD 02/16/2012, 7:19 AM

## 2012-02-16 NOTE — Progress Notes (Signed)
ANTICOAGULATION CONSULT NOTE - Follow Up Consult  Pharmacy Consult for heparin Indication: Afib/IABP  Labs:  Basename 02/15/12 2105 02/15/12 0425 02/14/12 1446  HGB -- 10.3* 12.2*  HCT -- 30.4* 35.2*  PLT -- 245 296  APTT -- -- 40*  LABPROT 21.0* 26.6* 30.0*  INR 1.78* 2.41* 2.81*  HEPARINUNFRC <0.10* <0.10* --  CREATININE -- 1.29 1.46*  CKTOTAL -- -- --  CKMB -- -- --  TROPONINI -- -- --    Assessment: 71yo male undetectable on heparin after started for Afib with IABP in place with INR now <2.  Goal of Therapy:  Heparin level 0.2-0.5 units/ml   Plan:  Will increase heparin gtt to 12 units/kg/hr at 950 units/hr and check level in 6hr.  Colleen Can PharmD BCPS 02/16/2012,12:39 AM

## 2012-02-16 NOTE — Progress Notes (Signed)
Pharmacist Heart Failure Core Measure Documentation  Assessment: Gregg [name] has an EF documented as 20-25% on 02/15/12 by Echo.  Rationale: Heart failure patients with left ventricular systolic dysfunction (LVSD) and an EF < 40% should be prescribed an angiotensin converting enzyme inhibitor (ACEI) or angiotensin receptor blocker (ARB) at discharge unless a contraindication is documented in the medical record.  This patient is not currently on an ACEI or ARB for HF.  This note is being placed in the record in order to provide documentation that a contraindication to the use of these agents is present for this encounter.  ACE Inhibitor or Angiotensin Receptor Blocker is contraindicated (specify all that apply)  []   ACEI allergy AND ARB allergy []   Angioedema []   Moderate or severe aortic stenosis []   Hyperkalemia [x]   Hypotension []   Renal artery stenosis []   Worsening renal function, preexisting renal disease or dysfunction   [name], Pharm D 02/16/2012 11:00 AM

## 2012-02-16 NOTE — Progress Notes (Signed)
ANTICOAGULATION CONSULT NOTE - Follow Up Consult  Pharmacy Consult for heparin Indication: Afib/IABP  Labs:  Basename 02/16/12 0650 02/15/12 2105 02/15/12 0425 02/14/12 1446  HGB -- -- 10.3* 12.2*  HCT -- -- 30.4* 35.2*  PLT -- -- 245 296  APTT -- -- -- 40*  LABPROT 19.8* 21.0* 26.6* --  INR 1.65* 1.78* 2.41* --  HEPARINUNFRC 0.11* <0.10* <0.10* --  CREATININE -- -- 1.29 1.46*  CKTOTAL -- -- -- --  CKMB -- -- -- --  TROPONINI -- -- -- --    Assessment: 71yo male undetectable on heparin after started for Afib with IABP in place with INR now <2. Xa level subtherapeutic, will increase gtt rate.  Goal of Therapy:  Heparin level 0.2-0.5 units/ml   Plan:  Increase heparin to 1100 units/hr and check 6 hour level. Cont to monitor h/h and plt.  Verlene Mayer, PharmD, BCPS Pager [phone] 02/16/2012,8:14 AM

## 2012-02-16 NOTE — Progress Notes (Addendum)
Pharmacy note- Heparin  -Heparin stopped this am for IABP removal -Plans are to restart heparin 8 hrs post removal (~7pm) -Last heparin rate was 1100 units/hr (heparin was stopped before a level was obtained  Plan -Will restart heparin at prior rate (1100 units/hr) at 7pm -Goal heparin level = 0.3- 0.7 -Heparin level in 6 hrs and daily  [name], Pharm D 02/16/2012 3:04 PM

## 2012-02-16 NOTE — Progress Notes (Signed)
IABP REMOVAL PROCEDURE EXPLAINED TO  PATIENT AND WIFE.  QUESTIONS ANSWERED.  ACT = 149. THEN PER PROTOCOL IABP WAS DISCONTINUED .   PRESSURE WAS HELD TO RIGHT GROIN X 30 MIN.  NO BLEEDING OR HEMATOMA  OBSERVED.  PEDAL PULSES PALPABLE AND STRONG.  PRESSURE DSG. APPLIED.  WILL CONTINUE TO MONITOR.

## 2012-02-17 LAB — PROTIME-INR
INR: 1.41 (ref 0.00–1.49)
Prothrombin Time: 17.5 seconds — ABNORMAL HIGH (ref 11.6–15.2)

## 2012-02-17 LAB — BASIC METABOLIC PANEL
GFR calc Af Amer: >90 mL/min (ref >90)
GFR calc non Af Amer: 83 mL/min — ABNORMAL LOW (ref >90)
Glucose, Bld: 112 mg/dL — ABNORMAL HIGH (ref 70–99)
Potassium: 3.9 mEq/L (ref 3.5–5.1)
Sodium: 135 mEq/L (ref 135–145)

## 2012-02-17 LAB — HEPARIN LEVEL (UNFRACTIONATED): Heparin Unfractionated: 0.18 IU/mL — ABNORMAL LOW (ref 0.30–0.70)

## 2012-02-17 LAB — CBC
Hemoglobin: 11.1 g/dL — ABNORMAL LOW (ref 13.0–17.0)
MCHC: 34.2 g/dL (ref 30.0–36.0)

## 2012-02-17 LAB — CARBOXYHEMOGLOBIN
Carboxyhemoglobin: 1.3 % (ref 0.5–1.5)
Methemoglobin: 0.9 % (ref 0.0–1.5)

## 2012-02-17 MED ORDER — FUROSEMIDE 10 MG/ML IJ SOLN
80.0000 mg | Freq: Two times a day (BID) | INTRAMUSCULAR | Status: DC
  Administered 2012-02-17: 80 mg via INTRAVENOUS
  Filled 2012-02-17 (×3): qty 8

## 2012-02-17 MED ORDER — HYDRALAZINE HCL 25 MG PO TABS
37.5000 mg | ORAL_TABLET | Freq: Three times a day (TID) | ORAL | Status: DC
  Administered 2012-02-17 – 2012-02-18 (×2): 37.5 mg via ORAL
  Filled 2012-02-17 (×5): qty 1.5

## 2012-02-17 MED ORDER — HYDRALAZINE HCL 25 MG PO TABS
12.5000 mg | ORAL_TABLET | Freq: Once | ORAL | Status: AC
  Administered 2012-02-17: 12.5 mg via ORAL
  Filled 2012-02-17: qty 0.5

## 2012-02-17 NOTE — Progress Notes (Signed)
ANTICOAGULATION CONSULT NOTE - Follow Up Consult  Pharmacy Consult for heparin Indication: atrial fibrillation  Labs:  Basename 02/17/12 1000 02/17/12 0135 02/16/12 0800 02/16/12 0650 02/15/12 2105 02/15/12 0425 02/14/12 1446  HGB -- 11.1* 11.2* -- -- -- --  HCT -- 32.5* 33.9* -- -- 30.4* --  PLT -- 205 214 -- -- 245 --  APTT -- -- -- -- -- -- 40*  LABPROT -- 17.5* -- 19.8* 21.0* -- --  INR -- 1.41 -- 1.65* 1.78* -- --  HEPARINUNFRC 0.38 0.18* -- 0.11* -- -- --  CREATININE -- 0.95 0.96 -- -- 1.29 --  CKTOTAL -- -- -- -- -- -- --  CKMB -- -- -- -- -- -- --  TROPONINI -- -- -- -- -- -- --    Assessment: Afib, chronic warfarin PTA - INR 1.41; Heparin restarted yday s/p IABP removal. No apical thrombus noted on ECHO. Heparin level therapeutic now on 1400 units/hr. No bleeding noted.   Goal of Therapy:  Heparin level 0.3-0.7 units/ml   Plan:  1) Continue heparin gtt at 1400 units/hr 2) Daily CBC and heparin level  Christoper Fabian, PharmD, BCPS Clinical pharmacist, pager [phone] 02/17/2012,11:25 AM

## 2012-02-17 NOTE — Progress Notes (Signed)
Chart reviewed. Discussed with nurse at bedside. CVP and PA pressures going back up. Will give lasix 80 IV bid.

## 2012-02-17 NOTE — Progress Notes (Addendum)
Subjective:  Patient has remained stable overnight.  No chest pain. No dyspnea at rest. Rhythm atrial fib with controlled ventricular response and occasion V-paced beats. Frequent PVCs.  BP 103/53 on milronone. PA mean pressure 43 with wedge pressure 21. Weight up one pound. IABP was removed yesterday without incident.  Objective:  Vital Signs in the last 24 hours: Temp:  [97.9 F (36.6 C)-99 F (37.2 C)] 98.4 F (36.9 C) (08/04 0700) Pulse Rate:  [74-111] 95  (08/04 0700) Resp:  [16-22] 16  (08/04 0700) BP: (89-113)/(46-74) 103/53 mmHg (08/04 0700) SpO2:  [96 %-100 %] 100 % (08/04 0700) Weight:  [167 lb 1.7 oz (75.8 kg)] 167 lb 1.7 oz (75.8 kg) (08/04 0500)  Intake/Output from previous day: 08/03 0701 - 08/04 0700 In: 1496.9 [P.O.:660; I.V.:836.9] Out: 1476 [Urine:1475; Stool:1] Intake/Output from this shift:       . amiodarone  400 mg Oral Daily  . antiseptic oral rinse  15 mL Mouth Rinse BID  . aspirin EC  81 mg Oral Daily  . digoxin  0.125 mg Oral Daily  . fenofibrate  160 mg Oral Daily  . furosemide  40 mg Oral Daily  . hydrALAZINE  25 mg Oral Q8H  . isosorbide mononitrate  30 mg Oral Daily  . magnesium oxide  400 mg Oral BID  . pantoprazole  40 mg Oral Q1200  . potassium chloride  40 mEq Oral Daily  . senna  1 tablet Oral Daily  . sodium chloride  3 mL Intravenous Q12H  . ursodiol  300 mg Oral BID      . sodium chloride    . sodium chloride 250 mL (02/16/12 1900)  . sodium chloride 5 mL/hr at 02/16/12 1900  . heparin 1,400 Units/hr (02/17/12 0357)  . milrinone 0.25 mcg/kg/min (02/16/12 2100)    Physical Exam: The patient appears to be in no distress.  Head and neck exam reveals that the pupils are equal and reactive.  The extraocular movements are full.  There is no scleral icterus.  Mouth and pharynx are benign.  No lymphadenopathy.  No carotid bruits.  The jugular venous pressure is normal.  Thyroid is not enlarged or tender.  Chest is clear to  percussion and auscultation.  No rales or rhonchi.  Expansion of the chest is symmetrical.  Heart reveals loud 3/6 murmur of MR at apex.   The abdomen is soft and nontender.  Bowel sounds are normoactive.  There is no hepatosplenomegaly or mass.  There are no abdominal bruits.  Extremities reveal no phlebitis or edema.  Pedal pulses are good.  There is no cyanosis or clubbing.Right groin dressing dry and intact.  Neurologic exam is normal strength and no lateralizing weakness.  No sensory deficits.  Integument reveals no rash  Lab Results:  Basename 02/17/12 0135 02/16/12 0800  WBC 5.6 5.4  HGB 11.1* 11.2*  PLT 205 214    Basename 02/17/12 0135 02/16/12 0800  NA 135 140  K 3.9 3.4*  CL 100 102  CO2 29 28  GLUCOSE 112* 58*  BUN 21 27*  CREATININE 0.95 0.96   No results found for this basename: TROPONINI:2,CK,MB:2 in the last 72 hours Hepatic Function Panel  Basename 02/14/12 1446  PROT 6.6  ALBUMIN 3.1*  AST 45*  ALT 38  ALKPHOS 77  BILITOT 1.6*  BILIDIR --  IBILI --   No results found for this basename: CHOL in the last 72 hours No results found for this basename: PROTIME in  the last 72 hours  Imaging: Dg Chest Port 1 View  02/15/2012  *RADIOLOGY REPORT*  Clinical Data: Evaluate Theone Murdoch catheter placement.  PORTABLE CHEST - 1 VIEW  Comparison: 02/15/2012; 02/14/2012  Findings:  Grossly unchanged enlarged cardiac silhouette and mediastinal contours.  Interval retraction of PA catheter with tip now overlying the main pulmonary artery outflow tract. Grossly unchanged positioning of intra-aortic balloon pump with tip approximately 2.3 cm from the superior aspect of the aortic arch. Pulmonary venous congestion is grossly unchanged.  No new focal airspace opacities.  No definite pleural effusion or pneumothorax. Grossly unchanged bones.  IMPRESSION: 1.  Interval retraction of PA catheter with tip now overlying the expected location of the main pulmonary artery outflow tract.  2.  Unchanged positioning of IABP with tip approximately 2.3 cm from the superior aspect of the aortic arch. 3.  Persistent findings of pulmonary venous congestion/mild pulmonary edema.  Original Report Authenticated By: Waynard Reeds, M.D.    Cardiac Studies: Telemetry atrial fib. [facility]* *[facility]* [address] [place], [place] [address] [phone]  ------------------------------------------------------------ Transthoracic Echocardiography  Patient: Jadarian, Mckay MR #: [id number] Study Date: 02/15/2012 Gender: M Age: 70 Height: Weight: BSA: Pt. Status: Room: 2913  ADMITTING Cassell Clement ATTENDING Cassell Clement PERFORMING Margaret, Hospital SONOGRAPHER Nolon Rod, RDCS ORDERING Bensimhon, Hardie Shackleton Bensimhon, Daniel cc:  ------------------------------------------------------------ LV EF: 20% - 25%  ------------------------------------------------------------ Study Conclusions  - Left ventricle: The cavity size was moderately dilated. Wall thickness was normal. Systolic function was severely reduced. The estimated ejection fraction was in the range of 20% to 25%. Diffuse hypokinesis. - Mitral valve: Mild regurgitation. - Left atrium: The atrium was severely dilated. - Right ventricle: The cavity size was mildly dilated. Systolic function was mildly reduced. - Right atrium: The atrium was moderately dilated. - Pulmonary arteries: Systolic pressure was mildly increased. - Pericardium, extracardiac: A small pericardial effusion was identified. Impressions:  - No LV apical thrombus noted using contrast. Transthoracic echocardiography. M-mode, complete 2D, spectral Doppler, and color Doppler. Patient status: Inpatient.    Assessment/Plan:  Patient Active Hospital Problem List: Cardiogenic shock (02/15/2012)   Assessment: Improved   Plan: Continue milronone. Atrial fibrillation (02/15/2012)   Assessment:  Rate controlled   Plan: Heparin successfully restarted yesterday after IABP removal.            No apical clot noted on 2D echo   LOS: 3 days    Cassell Clement 02/17/2012, 9:30 AM

## 2012-02-17 NOTE — Progress Notes (Signed)
ANTICOAGULATION CONSULT NOTE - Follow Up Consult  Pharmacy Consult for heparin Indication: atrial fibrillation  Labs:  Basename 02/17/12 0135 02/16/12 0800 02/16/12 0650 02/15/12 2105 02/15/12 0425 02/14/12 1446  HGB 11.1* 11.2* -- -- -- --  HCT 32.5* 33.9* -- -- 30.4* --  PLT 205 214 -- -- 245 --  APTT -- -- -- -- -- 40*  LABPROT 17.5* -- 19.8* 21.0* -- --  INR 1.41 -- 1.65* 1.78* -- --  HEPARINUNFRC 0.18* -- 0.11* <0.10* -- --  CREATININE 0.95 0.96 -- -- 1.29 --  CKTOTAL -- -- -- -- -- --  CKMB -- -- -- -- -- --  TROPONINI -- -- -- -- -- --    Assessment: 71yo male remains subtherapeutic on heparin for Afib after gtt resumed s/p IABP removal; no signs of bleeding per RN.  Goal of Therapy:  Heparin level 0.3-0.7 units/ml   Plan:  Will increase heparin gtt by 4 units/kg/hr to 1400 units/hr and check level in 6hr.  Colleen Can PharmD BCPS 02/17/2012,3:40 AM

## 2012-02-18 ENCOUNTER — Inpatient Hospital Stay (HOSPITAL_COMMUNITY): Payer: Medicare Other

## 2012-02-18 LAB — BASIC METABOLIC PANEL
CO2: 29 mEq/L (ref 19–32)
Calcium: 10.6 mg/dL — ABNORMAL HIGH (ref 8.4–10.5)
Creatinine, Ser: 0.95 mg/dL (ref 0.50–1.35)
Glucose, Bld: 89 mg/dL (ref 70–99)

## 2012-02-18 LAB — CBC
HCT: 33.9 % — ABNORMAL LOW (ref 39.0–52.0)
Hemoglobin: 11.5 g/dL — ABNORMAL LOW (ref 13.0–17.0)
MCH: 29.6 pg (ref 26.0–34.0)
MCV: 87.4 fL (ref 78.0–100.0)
RBC: 3.88 MIL/uL — ABNORMAL LOW (ref 4.22–5.81)

## 2012-02-18 MED ORDER — FUROSEMIDE 80 MG PO TABS
80.0000 mg | ORAL_TABLET | Freq: Two times a day (BID) | ORAL | Status: DC
  Administered 2012-02-18 – 2012-02-19 (×3): 80 mg via ORAL
  Filled 2012-02-18 (×5): qty 1

## 2012-02-18 MED ORDER — WARFARIN SODIUM 5 MG PO TABS
5.0000 mg | ORAL_TABLET | Freq: Once | ORAL | Status: AC
  Administered 2012-02-18: 5 mg via ORAL
  Filled 2012-02-18: qty 1

## 2012-02-18 MED ORDER — XENON XE 133 GAS
20.0000 | GAS_FOR_INHALATION | Freq: Once | RESPIRATORY_TRACT | Status: AC | PRN
  Administered 2012-02-18: 20 via RESPIRATORY_TRACT

## 2012-02-18 MED ORDER — HYDRALAZINE HCL 25 MG PO TABS
12.5000 mg | ORAL_TABLET | Freq: Three times a day (TID) | ORAL | Status: DC
  Administered 2012-02-18 – 2012-02-19 (×3): 12.5 mg via ORAL
  Filled 2012-02-18 (×6): qty 0.5

## 2012-02-18 MED ORDER — SILDENAFIL CITRATE 20 MG PO TABS
20.0000 mg | ORAL_TABLET | Freq: Three times a day (TID) | ORAL | Status: DC
  Administered 2012-02-18 – 2012-02-22 (×12): 20 mg via ORAL
  Filled 2012-02-18 (×14): qty 1

## 2012-02-18 MED ORDER — ENALAPRIL MALEATE 2.5 MG PO TABS
2.5000 mg | ORAL_TABLET | Freq: Two times a day (BID) | ORAL | Status: DC
  Administered 2012-02-18 – 2012-02-22 (×9): 2.5 mg via ORAL
  Filled 2012-02-18 (×10): qty 1

## 2012-02-18 MED ORDER — TECHNETIUM TO 99M ALBUMIN AGGREGATED
3.0000 | Freq: Once | INTRAVENOUS | Status: AC | PRN
  Administered 2012-02-18: 3 via INTRAVENOUS

## 2012-02-18 MED ORDER — WARFARIN - PHARMACIST DOSING INPATIENT
Freq: Every day | Status: DC
  Administered 2012-02-19: 18:00:00

## 2012-02-18 MED FILL — Perflutren Lipid Microsphere IV Susp 6.52 MG/ML: INTRAVENOUS | Qty: 2 | Status: AC

## 2012-02-18 NOTE — Progress Notes (Addendum)
ANTICOAGULATION CONSULT NOTE - Follow Up Consult  Pharmacy Consult for heparin Indication: atrial fibrillation  Labs:  Basename 02/18/12 0500 02/17/12 1000 02/17/12 0135 02/16/12 0800 02/16/12 0650  HGB 11.5* -- 11.1* -- --  HCT 33.9* -- 32.5* 33.9* --  PLT 200 -- 205 214 --  APTT -- -- -- -- --  LABPROT 17.6* -- 17.5* -- 19.8*  INR 1.42 -- 1.41 -- 1.65*  HEPARINUNFRC -- 0.38 0.18* -- 0.11*  CREATININE 0.95 -- 0.95 0.96 --  CKTOTAL -- -- -- -- --  CKMB -- -- -- -- --  TROPONINI -- -- -- -- --   Admit Complaint 71 y.o. male admitted 02/14/2012 after tx from [facility] hospital admitted with weakness, fatigue. Pharmacy consulted to dose heparin and warfarin.  Assessment: Anticoagulation: Afib, chronic warfarin PTA - Last HL 0.38, therapeutic.  Plan to re-start warfarin today. Heparin level for today being drawn now. No apical thrombus noted on ECHO. No bleeding noted.  Cardiovascular: NICM (EF 20-25%), CAD, ICD, VT, afib Dry Weight: TBD Current Weight: 75.8 kg. Planning for TEE/DCCV later this week. Will need evaluation for transplant/VAD. HR 80s, BP 90/60s, On milrinone at 0.25  (co-ox= 66.3). 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 SCr= 0.95, 1015/2950 UOP 1.6 ml/kg/hr, No current lasix order  Hematology / Oncology: chronic anemia, Hg stable.  PTA Medication Issues: Home Meds Not Ordered:  Lisinopril 5 mg daily.  Best Practices: Hep gtt, ppi po   Goal of Therapy:  Heparin level 0.3-0.7 units/ml   Plan:  1) Continue heparin at 1400 units/hr. 2) Warfarin 5 mg PO daily 3) F/u a.m. level  Thank you for allowing pharmacy to be a part of this patients care team.  Lovenia Kim Pharm.D., BCPS Clinical Pharmacist 02/18/2012 7:46 AM Pager: [phone] Phone: [phone]   Addendum:    Heparin level today = 0.72 which is the upper end of desired goal range.  Will decrease rate slightly to 1300 units/hr and follow up an AM heparin level.  Nadara Mustard, PharmD. Pager:  [phone]

## 2012-02-18 NOTE — Progress Notes (Signed)
Advanced Heart Failure Rounding Note   Subjective:   [name] is a 71 year old male, followed by Dr. Daryel November in [place], with a history of dilated nonischemic cardiomyopathy, reportedly diagnosed following evaluation at [facility] back in 2005. He has no reported history of obstructive coronary artery disease, underwent interval placement of a Guidant defibrillator with reported history of ventricular tachycardia, and has a left ventricular ejection fraction of approximately 20% to 25% with possibility of apical thrombus based on outside echocardiogram done just recently in June. Additional history includes atrial fibrillation on Coumadin; and episodic acute-on-chronic renal failure, creatinine now 1.4.  02/14/12 RHC/ LHC Normal coronary arteries.  Ao Pressure: 79/68 (55)  LV Pressure: 82/18 (26)  Pulmonary artery sats 27% and 28% RA 21 PA 59/45 (52)  PCWP 33  Fick 2.1/1.1 PVR 9.4  Echo EF 20-25% Mild RV dysfunction. Mild MR.  IABP inserted 8/1 and Milrinone started 0.25 mcg.  IABP removed 02/16/12.  Remains on milrinone.   CO-OX 28.3>27.4>>>>>66.3  IV lasix given yesterday with 1.9 liters UOP.  Hydralazine titrated 37.5 mg yesterday. Feels much better. Denies SOB/PND/orthopnea CP.  Ernestine Conrad numbers checked by NP & nursing staff this am closely. CVP 1-2. PA 60/22 (35) PCWP 19  Rechecked by me: CVP 29?? PA 69/34 (45) PCWP 13 Fick 4.7/2.1 PVR 6.8  Objective:    Vital Signs:   Temp:  [97.9 F (36.6 C)-99.5 F (37.5 C)] 98.8 F (37.1 C) (08/05 0700) Pulse Rate:  [52-189] 87  (08/05 0700) Resp:  [14-23] 18  (08/05 0700) BP: (92-111)/(38-81) 97/61 mmHg (08/05 0700) SpO2:  [91 %-100 %] 97 % (08/05 0700) Weight:  [75.9 kg (167 lb 5.3 oz)] 75.9 kg (167 lb 5.3 oz) (08/05 0600) Last BM Date: 02/14/12  Weight change: Filed Weights   02/16/12 0437 02/17/12 0500 02/18/12 0600  Weight: 75.7 kg (166 lb 14.2 oz) 75.8 kg (167 lb 1.7 oz) 75.9 kg (167 lb 5.3 oz)     Intake/Output:   Intake/Output Summary (Last 24 hours) at 02/18/12 0754 Last data filed at 02/18/12 0700  Gross per 24 hour  Intake 1055.5 ml  Output   2950 ml  Net -1894.5 ml    Physical Exam: General:  Lying flat. No distress.  Neck: supple. RIJ swan in place. Carotids 2+ bilat; no bruits. No lymphadenopathy or thryomegaly appreciated. Cor: PMI laterally displaced. Irregular rate & rhythm.3/6 MR. +s3 Lungs: clear Abdomen: soft, nontender, nondistended. No hepatosplenomegaly. No bruits or masses. Good bowel sounds. Extremities: no cyanosis, clubbing, rash, edema Neuro: alert & orientedx3, cranial nerves grossly intact. moves all 4 extremities w/o difficulty. Affect pleasant  Telemetry: A-fib 70-80s  Labs: Basic Metabolic Panel:  Lab 02/18/12 1191 02/17/12 0135 02/16/12 0800 02/15/12 0425 02/14/12 1446  NA 135 135 140 138 132*  K 3.7 3.9 3.4* 3.5 4.2  CL 97 100 102 98 94*  CO2 29 29 28 29 24   GLUCOSE 89 112* 58* 86 106*  BUN 17 21 27* 43* 49*  CREATININE 0.95 0.95 0.96 1.29 1.46*  CALCIUM 10.6* 10.0 10.2 -- --  MG -- -- -- -- 1.5  PHOS -- -- -- -- --    Liver Function Tests:  Lab 02/14/12 1446  AST 45*  ALT 38  ALKPHOS 77  BILITOT 1.6*  PROT 6.6  ALBUMIN 3.1*   No results found for this basename: LIPASE:5,AMYLASE:5 in the last 168 hours No results found for this basename: AMMONIA:3 in the last 168 hours  CBC:  Lab 02/18/12 0500 02/17/12  1610 02/16/12 0800 02/15/12 0425 02/14/12 1446  WBC 6.4 5.6 5.4 6.0 6.9  NEUTROABS -- -- -- -- 3.9  HGB 11.5* 11.1* 11.2* 10.3* 12.2*  HCT 33.9* 32.5* 33.9* 30.4* 35.2*  MCV 87.4 87.1 86.7 86.1 86.9  PLT 200 205 214 245 296     BNP (last 3 results)  Basename 02/14/12 1446  PROBNP 22343.0*     Other results:    Imaging: No results found.   Medications:     Scheduled Medications:    . amiodarone  400 mg Oral Daily  . antiseptic oral rinse  15 mL Mouth Rinse BID  . aspirin EC  81 mg Oral Daily  .  digoxin  0.125 mg Oral Daily  . fenofibrate  160 mg Oral Daily  . hydrALAZINE  12.5 mg Oral Once  . hydrALAZINE  37.5 mg Oral Q8H  . isosorbide mononitrate  30 mg Oral Daily  . magnesium oxide  400 mg Oral BID  . pantoprazole  40 mg Oral Q1200  . potassium chloride  40 mEq Oral Daily  . senna  1 tablet Oral Daily  . sodium chloride  3 mL Intravenous Q12H  . ursodiol  300 mg Oral BID  . DISCONTD: furosemide  80 mg Intravenous BID  . DISCONTD: furosemide  40 mg Oral Daily  . DISCONTD: hydrALAZINE  25 mg Oral Q8H    Infusions:    . sodium chloride    . sodium chloride 10 mL/hr at 02/17/12 2239  . sodium chloride 10 mL/hr at 02/17/12 2236  . heparin 1,400 Units/hr (02/17/12 1440)  . milrinone 0.25 mcg/kg/min (02/17/12 1441)    PRN Medications: sodium chloride, acetaminophen, benzonatate, ondansetron (ZOFRAN) IV, sodium chloride   Assessment:   1. Cardiogenic Shock 2. A/C systolic heart failure 3. A-fib - on chronic coumadin (has been in AF since January per patient report) 4. CKD (baseline 1.4) 5. Possible Apical thrombus 6. NICM with normal cors cath 02/14/12. EF 20-25% by echo with only mild RV dysfunction  Plan/Discussion:    Tolerating removal of IABP, Co-ox 66 this am. Hemodynamics variable - tubing being replaced an recalibrated this am. However it seems that he may have significant increase in pulmonary vascular resistance despite milrinone. May need to add revatio. Consider VQ scan +/- CT chest and PFTs. Baseline CXR ok.   Will switch to po lasix this am but may need to go back on IV.   Given probable need for PDE-5 inhibition will stop Imdur and wean hydralazine to make room for ACE-I. No b-blocker yet.   Will plan on having PICC line placed once Ernestine Conrad is removed.    Has been in AF since at least January. He has been maintained on amio by his outside team,  Will plan on  TEE/DC-CV this week.  As heparin was restarted 8/3 after removal of IABP.  Restart  coumadin.  Doubt we can wean milrinone given elevated PVR and the fact that he has had multiple admits for HF over past few months an presented with profound shock.  Will continue evaluation for transplant/VAD. (blood type B+) but this is limited by elevate R-sided pressures/PVR. Will continue to titrate meds.  The patient is critically ill with multiple organ systems failure and requires high complexity decision making for assessment and support, frequent evaluation and titration of therapies, application of advanced monitoring technologies and extensive interpretation of multiple databases.   Critical Care Time devoted to patient care services described in this note is 32  Minutes.   Length of Stay: 4 Victorious Kundinger,MD 8:22 AM

## 2012-02-19 LAB — CBC
MCH: 29.2 pg (ref 26.0–34.0)
Platelets: 181 10*3/uL (ref 150–400)
RBC: 4.08 MIL/uL — ABNORMAL LOW (ref 4.22–5.81)
RDW: 16.1 % — ABNORMAL HIGH (ref 11.5–15.5)
WBC: 6.2 10*3/uL (ref 4.0–10.5)

## 2012-02-19 LAB — CARBOXYHEMOGLOBIN
Carboxyhemoglobin: 1.8 % — ABNORMAL HIGH (ref 0.5–1.5)
O2 Saturation: 81.2 %

## 2012-02-19 LAB — PROTIME-INR: Prothrombin Time: 16.6 seconds — ABNORMAL HIGH (ref 11.6–15.2)

## 2012-02-19 LAB — HEPARIN LEVEL (UNFRACTIONATED): Heparin Unfractionated: 0.47 IU/mL (ref 0.30–0.70)

## 2012-02-19 MED ORDER — HYDROCODONE-ACETAMINOPHEN 5-325 MG PO TABS
1.0000 | ORAL_TABLET | Freq: Four times a day (QID) | ORAL | Status: DC | PRN
  Administered 2012-02-19: 1 via ORAL
  Filled 2012-02-19: qty 1

## 2012-02-19 MED ORDER — ALUM & MAG HYDROXIDE-SIMETH 200-200-20 MG/5ML PO SUSP
30.0000 mL | Freq: Four times a day (QID) | ORAL | Status: DC | PRN
  Administered 2012-02-19: 30 mL via ORAL
  Filled 2012-02-19: qty 30

## 2012-02-19 MED ORDER — WARFARIN SODIUM 7.5 MG PO TABS
7.5000 mg | ORAL_TABLET | Freq: Once | ORAL | Status: AC
  Administered 2012-02-19: 7.5 mg via ORAL
  Filled 2012-02-19: qty 1

## 2012-02-19 MED ORDER — FUROSEMIDE 40 MG PO TABS
60.0000 mg | ORAL_TABLET | Freq: Two times a day (BID) | ORAL | Status: DC
  Administered 2012-02-19 – 2012-02-20 (×2): 60 mg via ORAL
  Filled 2012-02-19 (×4): qty 1

## 2012-02-19 NOTE — Progress Notes (Signed)
Advanced Heart Failure Rounding Note   Subjective:   [name] is a 71 year old male, followed by Dr. Daryel November in [place], with a history of dilated nonischemic cardiomyopathy, reportedly diagnosed following evaluation at [facility] back in 2005. He has no reported history of obstructive coronary artery disease, underwent interval placement of a Guidant defibrillator with reported history of ventricular tachycardia, and has a left ventricular ejection fraction of approximately 20% to 25% with possibility of apical thrombus based on outside echocardiogram done just recently in June. Additional history includes atrial fibrillation on Coumadin; and episodic acute-on-chronic renal failure, creatinine now 1.4.  02/14/12 RHC/ LHC Normal coronary arteries.  Ao Pressure: 79/68 (55)  LV Pressure: 82/18 (26)  Pulmonary artery sats 27% and 28% RA 21 PA 59/45 (52)  PCWP 33  Fick 2.1/1.1 PVR 9.4  Echo EF 20-25% Mild RV dysfunction. Mild MR.  IABP inserted 8/1 and Milrinone started 0.25 mcg.  IABP removed 02/16/12.  Remains on milrinone.   CO-OX 28.3>27.4>>>>>81.2  Lasix increased yesterday for elevated PCWP.  1.2 liters UOP. Hydralazine titrated down and nitrate stopped for addition of revatio 20 mg TID.  Low dose ace-i also initiated. Denies SOB/PND/orthopnea/CP.   Mild HA that started yesterday.    VQ low prob.   Swan numbers checked this am closely. CVP 3. PA 50/16 (26) PCWP 16. PVR 2.7    Objective:    Vital Signs:   Temp:  [97.9 F (36.6 C)-98.4 F (36.9 C)] 98.1 F (36.7 C) (08/06 0800) Pulse Rate:  [73-104] 86  (08/06 0800) Resp:  [11-26] 16  (08/06 0800) BP: (82-110)/(15-73) 93/48 mmHg (08/06 0800) SpO2:  [89 %-100 %] 95 % (08/06 0800) Weight:  [72.9 kg (160 lb 11.5 oz)] 72.9 kg (160 lb 11.5 oz) (08/06 0500) Last BM Date: 02/19/12  Weight change: Filed Weights   02/17/12 0500 02/18/12 0600 02/19/12 0500  Weight: 75.8 kg (167 lb 1.7 oz) 75.9 kg (167 lb 5.3 oz) 72.9 kg  (160 lb 11.5 oz)    Intake/Output:   Intake/Output Summary (Last 24 hours) at 02/19/12 0839 Last data filed at 02/19/12 0700  Gross per 24 hour  Intake 1735.7 ml  Output   3050 ml  Net -1314.3 ml    Physical Exam: General:  Lying flat. No distress.  Neck: supple. RIJ swan in place. Carotids 2+ bilat; no bruits. No lymphadenopathy or thryomegaly appreciated. Cor: PMI laterally displaced. Irregular rate & rhythm.3/6 MR. +s3 Lungs: clear Abdomen: soft, nontender, nondistended. No hepatosplenomegaly. No bruits or masses. Good bowel sounds. Extremities: no cyanosis, clubbing, rash, edema Neuro: alert & orientedx3, cranial nerves grossly intact. moves all 4 extremities w/o difficulty. Affect pleasant  Telemetry: A-fib 70-80s  Labs: Basic Metabolic Panel:  Lab 02/18/12 4098 02/17/12 0135 02/16/12 0800 02/15/12 0425 02/14/12 1446  NA 135 135 140 138 132*  K 3.7 3.9 3.4* 3.5 4.2  CL 97 100 102 98 94*  CO2 29 29 28 29 24   GLUCOSE 89 112* 58* 86 106*  BUN 17 21 27* 43* 49*  CREATININE 0.95 0.95 0.96 1.29 1.46*  CALCIUM 10.6* 10.0 10.2 -- --  MG -- -- -- -- 1.5  PHOS -- -- -- -- --    Liver Function Tests:  Lab 02/14/12 1446  AST 45*  ALT 38  ALKPHOS 77  BILITOT 1.6*  PROT 6.6  ALBUMIN 3.1*   No results found for this basename: LIPASE:5,AMYLASE:5 in the last 168 hours No results found for this basename: AMMONIA:3 in the last  168 hours  CBC:  Lab 02/19/12 0500 02/18/12 0500 02/17/12 0135 02/16/12 0800 02/15/12 0425 02/14/12 1446  WBC 6.2 6.4 5.6 5.4 6.0 --  NEUTROABS -- -- -- -- -- 3.9  HGB 11.9* 11.5* 11.1* 11.2* 10.3* --  HCT 35.0* 33.9* 32.5* 33.9* 30.4* --  MCV 85.8 87.4 87.1 86.7 86.1 --  PLT 181 200 205 214 245 --     BNP (last 3 results)  Basename 02/14/12 1446  PROBNP 22343.0*     Other results:    Imaging: Dg Chest 2 View  02/18/2012  *RADIOLOGY REPORT*  Clinical Data: Dyspnea  CHEST - 2 VIEW  Comparison: 02/15/2012  Findings: The IABP tip is no  longer seen.  Right IJ Swan-Ganz has been advanced into the right pulmonary artery.  Left subclavian AICD stable.  Stable cardiomegaly.  Improvement in the interstitial edema seen previously with residual mild central pulmonary vascular congestion.  Small left pleural effusion.  Regional bones unremarkable.  IMPRESSION:  1.  New Theone Murdoch advanced into the right pulmonary artery. 2.  Interval improvement in interstitial edema with mild residual vascular congestion and small left effusion.  Original Report Authenticated By: Osa Craver, M.D.   Nm Pulmonary Per & Vent  02/18/2012  *RADIOLOGY REPORT*  Clinical Data:  Evaluation for cardiac transplant.  NUCLEAR MEDICINE VENTILATION - PERFUSION LUNG SCAN  Technique:  Wash-in, equilibrium, and wash-out phase ventilation images were obtained using Xe-133 gas.  Perfusion images were obtained in multiple projections after intravenous injection of Tc- 56m MAA.  Radiopharmaceuticals:  4.0 mCi Xe-133 gas and 20.0 mCi Tc-58m MAA.  Comparison: Chest radiograph 02/18/2012  Findings:  Ventilation:  No focal ventilation defect.  Not significant air trapping.  Perfusion:  No wedge shaped peripheral perfusion defects to suggest acute pulmonary embolism.  Decreased  perfusion to the lung bases likely related to cardiomegaly.  IMPRESSION:  Very low probability for acute pulmonary embolism.  Original Report Authenticated By: Genevive Bi, M.D.     Medications:     Scheduled Medications:    . amiodarone  400 mg Oral Daily  . antiseptic oral rinse  15 mL Mouth Rinse BID  . aspirin EC  81 mg Oral Daily  . digoxin  0.125 mg Oral Daily  . enalapril  2.5 mg Oral BID  . fenofibrate  160 mg Oral Daily  . furosemide  80 mg Oral BID  . magnesium oxide  400 mg Oral BID  . pantoprazole  40 mg Oral Q1200  . potassium chloride  40 mEq Oral Daily  . senna  1 tablet Oral Daily  . sildenafil  20 mg Oral TID  . sodium chloride  3 mL Intravenous Q12H  . ursodiol  300  mg Oral BID  . warfarin  5 mg Oral ONCE-1800  . Warfarin - Pharmacist Dosing Inpatient   Does not apply q1800  . DISCONTD: hydrALAZINE  12.5 mg Oral Q8H    Infusions:    . sodium chloride    . sodium chloride 250 mL (02/18/12 1900)  . sodium chloride 10 mL/hr at 02/18/12 1900  . heparin 1,300 Units/hr (02/19/12 0500)  . milrinone 0.25 mcg/kg/min (02/19/12 0200)    PRN Medications: sodium chloride, acetaminophen, benzonatate, ondansetron (ZOFRAN) IV, sodium chloride, technetium albumin aggregated, xenon xe 133   Assessment:   1. Cardiogenic Shock 2. A/C systolic heart failure 3. A-fib - on chronic coumadin (has been in AF since January per patient report) 4. CKD (baseline 1.4) 5. Possible Apical  thrombus 6. NICM with normal cors cath 02/14/12. EF 20-25% by echo with only mild RV dysfunction  Plan/Discussion:    Hemodynamics much improved this am. I think he will need home milrinone and revatio. Will keep swan in one more day to fine tune diuretic regimen. Continue low-dose ACE-I. Hold b-blocker for now.    Will plan on having PICC line placed once Ernestine Conrad is removed.    Has been in AF since at least January. He has been maintained on amio by his outside team,  Will plan on  TEE/DC-CV in the next 24-48 hours.  As heparin was restarted 8/3 after removal of IABP.  Restart coumadin.  Will try to get home in the next few days and proceed with evaluation for transplant/VAD. (blood type B+) as R-sided hemodynamics permit.  The patient is critically ill with multiple organ systems failure and requires high complexity decision making for assessment and support, frequent evaluation and titration of therapies, application of advanced monitoring technologies and extensive interpretation of multiple databases.   Critical Care Time devoted to patient care services described in this note is 35 Minutes.   Length of Stay: 5 Daniel Bensimhon,MD 8:44 AM

## 2012-02-19 NOTE — Care Management Note (Addendum)
    Page 1 of 2   02/21/2012     11:47:45 AM   CARE MANAGEMENT NOTE 02/21/2012  Patient:  Gregg [name], Gregg [name]   Account Number:  [id number]  Date Initiated:  02/14/2012  Documentation initiated by:  Gregg [name]  Subjective/Objective Assessment:   adm w chf     Action/Plan:   lives w wife   Anticipated DC Date:     Anticipated DC Plan:  HOME W HOME HEALTH SERVICES      DC Planning Services  CM consult      PAC Choice  DURABLE MEDICAL EQUIPMENT  HOME HEALTH   Choice offered to / List presented to:  C-1 Patient   DME arranged  IV PUMP/EQUIPMENT      DME agency  [facility]     [facility] arranged  HH-1 RN      [facility] agency  [facility]   Status of service:   Medicare Important Message given?   (If response is "NO", the following Medicare IM given date fields will be blank) Date Medicare IM given:   Date Additional Medicare IM given:    Discharge Disposition:  HOME W HOME HEALTH SERVICES  Per UR Regulation:  Reviewed for med. necessity/level of care/duration of stay  If discussed at Long Length of Stay Meetings, dates discussed:   02/21/2012    Comments:  8/8 11:35a Gregg Denishia Citro rn,bsn spoke w pt and wife on 8/7, has used [facility], ref faxed to [facility]. spoke w [facility] in [place] but they have had problems getting milrinone and not sure who would hook pt up for home milrinone. [facility], does alot of milrinone and have someone that will hook pt up at disch. ref to [facility] for iv milrinone. [facility] will provide nsg visits. alerted [facility] had found different dme provider that can hook up.  8/6 14:31p Gregg Cailean Heacock rn,bsn spoke w pt. he does have supp wife. he lives in [place]. hx of hhx but not sure of hhc agency. he wouln't mind hhc nse again if md felt he needed it. will cont to follow.  02/14/12 13:44p Gregg Sky Borboa rn,bsn [phone]

## 2012-02-19 NOTE — Progress Notes (Signed)
ANTICOAGULATION CONSULT NOTE - Follow Up Consult  Pharmacy Consult for Heparin + Coumadin Indication: atrial fibrillation  No Known Allergies  Vital Signs: Temp: 98.1 F (36.7 C) (08/06 0900) Temp src: Core (Comment) (08/06 0800) BP: 101/35 mmHg (08/06 0900) Pulse Rate: 84  (08/06 0900)  Labs:  Basename 02/19/12 0500 02/18/12 0750 02/18/12 0500 02/17/12 1000 02/17/12 0135  HGB 11.9* -- 11.5* -- --  HCT 35.0* -- 33.9* -- 32.5*  PLT 181 -- 200 -- 205  APTT -- -- -- -- --  LABPROT 16.6* -- 17.6* -- 17.5*  INR 1.32 -- 1.42 -- 1.41  HEPARINUNFRC 0.47 0.72* -- 0.38 --  CREATININE -- -- 0.95 -- 0.95  CKTOTAL -- -- -- -- --  CKMB -- -- -- -- --  TROPONINI -- -- -- -- --    Estimated Creatinine Clearance: 75.7 ml/min (by C-G formula based on Cr of 0.95).  Medications:  Heparin @ 1300 units/hr  Assessment: 70yom continues on coumadin with heparin bridge for afib s/p IABP removal. Heparin level is therapeutic. INR is subtherapeutic as expected after coumadin resumed last night.  Will give a higher dose of coumadin tonight as INR is dropping. No bleeding noted. CBC stable. Plan for TEE/DCCV later this week. Also on amiodarone 400mg  daily (home dose = 200mg  daily) - watch for any DI.  Goal of Therapy:  INR 2-3 Heparin level 0.3-0.7 Monitor platelets by anticoagulation protocol: Yes   Plan:  1) Continue heparin at 1300 units/hr 2) Coumadin 7.5mg  x 1 3) Follow up INR, heparin level in AM  [name] 02/19/2012,9:57 AM

## 2012-02-20 ENCOUNTER — Inpatient Hospital Stay (HOSPITAL_COMMUNITY): Payer: Medicare Other

## 2012-02-20 LAB — HEPARIN LEVEL (UNFRACTIONATED): Heparin Unfractionated: 0.45 IU/mL (ref 0.30–0.70)

## 2012-02-20 LAB — CBC
HCT: 39.1 % (ref 39.0–52.0)
Hemoglobin: 13.3 g/dL (ref 13.0–17.0)
WBC: 7.6 10*3/uL (ref 4.0–10.5)

## 2012-02-20 LAB — BASIC METABOLIC PANEL
Chloride: 97 mEq/L (ref 96–112)
GFR calc Af Amer: 76 mL/min — ABNORMAL LOW (ref >90)
Potassium: 4 mEq/L (ref 3.5–5.1)
Sodium: 133 mEq/L — ABNORMAL LOW (ref 135–145)

## 2012-02-20 LAB — PROTIME-INR: INR: 1.3 (ref 0.00–1.49)

## 2012-02-20 LAB — CARBOXYHEMOGLOBIN: Methemoglobin: 0.8 % (ref 0.0–1.5)

## 2012-02-20 MED ORDER — SODIUM CHLORIDE 0.9 % IJ SOLN: 3.0000 mL | INTRAMUSCULAR | Status: DC | PRN

## 2012-02-20 MED ORDER — FUROSEMIDE 40 MG PO TABS
40.0000 mg | ORAL_TABLET | Freq: Two times a day (BID) | ORAL | Status: DC
  Filled 2012-02-20 (×2): qty 1

## 2012-02-20 MED ORDER — SODIUM CHLORIDE 0.9 % IJ SOLN
10.0000 mL | Freq: Two times a day (BID) | INTRAMUSCULAR | Status: DC
  Administered 2012-02-20 – 2012-02-22 (×3): 10 mL

## 2012-02-20 MED ORDER — SODIUM CHLORIDE 0.9 % IJ SOLN
3.0000 mL | Freq: Two times a day (BID) | INTRAMUSCULAR | Status: DC
  Administered 2012-02-20: 3 mL via INTRAVENOUS

## 2012-02-20 MED ORDER — SODIUM CHLORIDE 0.9 % IJ SOLN: 10.0000 mL | INTRAMUSCULAR | Status: DC | PRN

## 2012-02-20 MED ORDER — SODIUM CHLORIDE 0.9 % IV SOLN
250.0000 mL | INTRAVENOUS | Status: DC
  Administered 2012-02-20: 250 mL via INTRAVENOUS

## 2012-02-20 MED ORDER — WARFARIN SODIUM 7.5 MG PO TABS
7.5000 mg | ORAL_TABLET | Freq: Once | ORAL | Status: AC
  Administered 2012-02-20: 7.5 mg via ORAL
  Filled 2012-02-20: qty 1

## 2012-02-20 NOTE — Progress Notes (Signed)
Peripherally Inserted Central Catheter/Midline Placement  The IV Nurse has discussed with the patient and/or persons authorized to consent for the patient, the purpose of this procedure and the potential benefits and risks involved with this procedure.  The benefits include less needle sticks, lab draws from the catheter and patient may be discharged home with the catheter.  Risks include, but not limited to, infection, bleeding, blood clot (thrombus formation), and puncture of an artery; nerve damage and irregular heat beat.  Alternatives to this procedure were also discussed.  PICC/Midline Placement Documentation        Timmothy Sours 02/20/2012, 5:52 PM

## 2012-02-20 NOTE — Progress Notes (Signed)
ANTICOAGULATION CONSULT NOTE - Follow Up Consult  Pharmacy Consult for Heparin + Coumadin Indication: atrial fibrillation  No Known Allergies  Vital Signs: Temp: 98 F (36.7 C) (08/07 0847) Temp src: Oral (08/07 0847) BP: 87/60 mmHg (08/07 0800) Pulse Rate: 91  (08/07 0800)  Labs:  Basename 02/20/12 0359 02/19/12 0500 02/18/12 0750 02/18/12 0500  HGB 13.3 11.9* -- --  HCT 39.1 35.0* -- 33.9*  PLT 196 181 -- 200  APTT -- -- -- --  LABPROT 16.4* 16.6* -- 17.6*  INR 1.30 1.32 -- 1.42  HEPARINUNFRC 0.45 0.47 0.72* --  CREATININE 1.11 -- -- 0.95  CKTOTAL -- -- -- --  CKMB -- -- -- --  TROPONINI -- -- -- --    Estimated Creatinine Clearance: 63.4 ml/min (by C-G formula based on Cr of 1.11).   Medications:  Heparin @ 1300 units/hr  Assessment: 70yom continues on coumadin with heparin bridge for afib s/p IABP removal. Heparin level is therapeutic. INR is subtherapeutic and did not move despite increasing the dose last evening. Will give higher dose again tonight.  No bleeding noted. CBC stable. For TEE/DCCV tomorrow. Continues on amiodarone.  Goal of Therapy:  INR 2-3 Heparin level 0.3-0.7 Monitor platelets by anticoagulation protocol: Yes   Plan:  1) Continue heparin at 1300 units/hr 2) Repeat coumadin 7.5mg  x 1 3) Follow up INR, heparin level in AM  Gregg [name] 02/20/2012,12:05 PM

## 2012-02-20 NOTE — Progress Notes (Signed)
Advanced Heart Failure Rounding Note   Subjective:   [name] is a 71 year old male, followed by Dr. Daryel November in [place], with a history of dilated nonischemic cardiomyopathy, reportedly diagnosed following evaluation at [facility] back in 2005. He has no reported history of obstructive coronary artery disease, underwent interval placement of a Guidant defibrillator with reported history of ventricular tachycardia, and has a left ventricular ejection fraction of approximately 20% to 25% with possibility of apical thrombus based on outside echocardiogram done just recently in June. Additional history includes atrial fibrillation on Coumadin; and episodic acute-on-chronic renal failure, creatinine now 1.4.  02/14/12 RHC/ LHC Normal coronary arteries.  Ao Pressure: 79/68 (55)  LV Pressure: 82/18 (26)  Pulmonary artery sats 27% and 28% RA 21 PA 59/45 (52)  PCWP 33  Fick 2.1/1.1 PVR 9.4  Echo EF 20-25% Mild RV dysfunction. Mild MR.  IABP inserted 8/1 and Milrinone started 0.25 mcg.  IABP removed 02/16/12.  Remains on milrinone.  VQ low prob on 8/5   CO-OX 28.3>27.4>>>>>78.3  Lasix increased yesterday for elevated PCWP.  1.2 liters UOP. Hydralazine titrated down and nitrate stopped for addition of revatio 20 mg TID.  Low dose ace-i also initiated. Denies SOB/PND/orthopnea/CP.   HA improved.  Emesis after trying to take meds last night, improved.    Swan numbers checked this am. CVP 4. PA 48/19  PCWP 11. PVR 5.3  Co-ox 78   Objective:    Vital Signs:   Temp:  [97.4 F (36.3 C)-98.4 F (36.9 C)] 97.4 F (36.3 C) (08/07 1307) Pulse Rate:  [67-97] 91  (08/07 0800) Resp:  [9-22] 17  (08/07 0800) BP: (81-110)/(46-73) 87/60 mmHg (08/07 0800) SpO2:  [91 %-97 %] 94 % (08/07 0800) Weight:  [71.442 kg (157 lb 8 oz)] 71.442 kg (157 lb 8 oz) (08/07 0500) Last BM Date: 02/19/12  Weight change: Filed Weights   02/18/12 0600 02/19/12 0500 02/20/12 0500  Weight: 75.9 kg (167 lb 5.3 oz)  72.9 kg (160 lb 11.5 oz) 71.442 kg (157 lb 8 oz)    Intake/Output:   Intake/Output Summary (Last 24 hours) at 02/20/12 1330 Last data filed at 02/20/12 1100  Gross per 24 hour  Intake   1138 ml  Output   2450 ml  Net  -1312 ml    Physical Exam: General:  Lying flat. No distress.  Neck: supple. RIJ swan in place. Carotids 2+ bilat; no bruits. No lymphadenopathy or thryomegaly appreciated. Cor: PMI laterally displaced. Irregular rate & rhythm.3/6 MR. +s3 Lungs: clear Abdomen: soft, nontender, nondistended. No hepatosplenomegaly. No bruits or masses. Good bowel sounds. Extremities: no cyanosis, clubbing, rash, edema Neuro: alert & orientedx3, cranial nerves grossly intact. moves all 4 extremities w/o difficulty. Affect pleasant  Telemetry: A-fib 70-80s  Labs: Basic Metabolic Panel:  Lab 02/20/12 1914 02/18/12 0500 02/17/12 0135 02/16/12 0800 02/15/12 0425 02/14/12 1446  NA 133* 135 135 140 138 --  K 4.0 3.7 3.9 3.4* 3.5 --  CL 97 97 100 102 98 --  CO2 29 29 29 28 29  --  GLUCOSE 115* 89 112* 58* 86 --  BUN 18 17 21  27* 43* --  CREATININE 1.11 0.95 0.95 0.96 1.29 --  CALCIUM 11.0* 10.6* 10.0 -- -- --  MG -- -- -- -- -- 1.5  PHOS -- -- -- -- -- --    Liver Function Tests:  Lab 02/14/12 1446  AST 45*  ALT 38  ALKPHOS 77  BILITOT 1.6*  PROT 6.6  ALBUMIN 3.1*  No results found for this basename: LIPASE:5,AMYLASE:5 in the last 168 hours No results found for this basename: AMMONIA:3 in the last 168 hours  CBC:  Lab 02/20/12 0359 02/19/12 0500 02/18/12 0500 02/17/12 0135 02/16/12 0800 02/14/12 1446  WBC 7.6 6.2 6.4 5.6 5.4 --  NEUTROABS -- -- -- -- -- 3.9  HGB 13.3 11.9* 11.5* 11.1* 11.2* --  HCT 39.1 35.0* 33.9* 32.5* 33.9* --  MCV 85.7 85.8 87.4 87.1 86.7 --  PLT 196 181 200 205 214 --     BNP (last 3 results)  Basename 02/14/12 1446  PROBNP 22343.0*     Other results:    INR 1.3. Heparin therapeutic Imaging: No results found.   Medications:      Scheduled Medications:    . amiodarone  400 mg Oral Daily  . antiseptic oral rinse  15 mL Mouth Rinse BID  . aspirin EC  81 mg Oral Daily  . digoxin  0.125 mg Oral Daily  . enalapril  2.5 mg Oral BID  . fenofibrate  160 mg Oral Daily  . furosemide  40 mg Oral BID  . magnesium oxide  400 mg Oral BID  . pantoprazole  40 mg Oral Q1200  . potassium chloride  40 mEq Oral Daily  . senna  1 tablet Oral Daily  . sildenafil  20 mg Oral TID  . sodium chloride  3 mL Intravenous Q12H  . ursodiol  300 mg Oral BID  . warfarin  7.5 mg Oral ONCE-1800  . warfarin  7.5 mg Oral ONCE-1800  . Warfarin - Pharmacist Dosing Inpatient   Does not apply q1800  . DISCONTD: furosemide  60 mg Oral BID    Infusions:    . sodium chloride    . sodium chloride 250 mL (02/18/12 1900)  . sodium chloride 10 mL/hr at 02/18/12 1900  . heparin 1,300 Units/hr (02/19/12 2117)  . milrinone 0.25 mcg/kg/min (02/20/12 1027)    PRN Medications: sodium chloride, acetaminophen, alum & mag hydroxide-simeth, benzonatate, HYDROcodone-acetaminophen, ondansetron (ZOFRAN) IV, sodium chloride   Assessment:   1. Cardiogenic Shock 2. A/C systolic heart failure 3. A-fib - on chronic coumadin (has been in AF since January per patient report) 4. CKD (baseline 1.4) 5. Possible Apical thrombus 6. NICM with normal cors cath 02/14/12. EF 20-25% by echo with only mild RV dysfunction  Plan/Discussion:    Continues to improve on milrinone and revatio.  Blood pressure soft will hold off on b-blocker at this time.  Continue low dose ACE-I.  Will discontinue swan today and plan for PICC line to be placed for home inotrope use.  With CVP and Wedge on low side will hold afternoon lasix and restart at 40 mg BID tomorrow.   Has been in AF since at least January. He has been maintained on amio by his outside team,  Will plan on TEE/DC-CV tomorrow morning.  As heparin was restarted 8/3 after removal of IABP.  Restarted coumadin.  Will  try to get home in the next few days and proceed with outpatient evaluation for transplant/VAD. (blood type B+) as R-sided hemodynamics/PVR permit. Will need another RHC in 3-4 weeks  The patient is critically ill with multiple organ systems failure and requires high complexity decision making for assessment and support, frequent evaluation and titration of therapies, application of advanced monitoring technologies and extensive interpretation of multiple databases.   Critical Care Time devoted to patient care services described in this note is 35 Minutes.   Length of  Stay: 6 Daniel Bensimhon,MD 1:30 PM

## 2012-02-21 ENCOUNTER — Encounter (HOSPITAL_COMMUNITY): Payer: Self-pay | Admitting: Anesthesiology

## 2012-02-21 ENCOUNTER — Encounter (HOSPITAL_COMMUNITY)
Admission: EM | Disposition: A | Discharge status: Home Health | Payer: Self-pay | Source: Other Acute Inpatient Hospital | Attending: Cardiology

## 2012-02-21 DIAGNOSIS — I4891 Unspecified atrial fibrillation: Secondary | ICD-10-CM

## 2012-02-21 LAB — HEPARIN LEVEL (UNFRACTIONATED): Heparin Unfractionated: 0.21 IU/mL — ABNORMAL LOW (ref 0.30–0.70)

## 2012-02-21 LAB — PROTIME-INR: Prothrombin Time: 22.4 seconds — ABNORMAL HIGH (ref 11.6–15.2)

## 2012-02-21 LAB — BASIC METABOLIC PANEL
Calcium: 10.7 mg/dL — ABNORMAL HIGH (ref 8.4–10.5)
Creatinine, Ser: 1.08 mg/dL (ref 0.50–1.35)
GFR calc non Af Amer: 68 mL/min — ABNORMAL LOW (ref >90)
Glucose, Bld: 92 mg/dL (ref 70–99)
Sodium: 132 mEq/L — ABNORMAL LOW (ref 135–145)

## 2012-02-21 LAB — CBC
MCH: 29 pg (ref 26.0–34.0)
Platelets: 212 10*3/uL (ref 150–400)
RBC: 4.51 MIL/uL (ref 4.22–5.81)
WBC: 5.2 10*3/uL (ref 4.0–10.5)

## 2012-02-21 LAB — CARBOXYHEMOGLOBIN
Carboxyhemoglobin: 1.7 % — ABNORMAL HIGH (ref 0.5–1.5)
O2 Saturation: 74.7 %

## 2012-02-21 SURGERY — ECHOCARDIOGRAM, TRANSESOPHAGEAL
Anesthesia: Moderate Sedation

## 2012-02-21 MED ORDER — SODIUM CHLORIDE 0.9 % IV SOLN: INTRAVENOUS | Status: DC

## 2012-02-21 MED ORDER — FENTANYL CITRATE 0.05 MG/ML IJ SOLN
25.0000 ug | Freq: Once | INTRAMUSCULAR | Status: AC | PRN
  Administered 2012-02-21: 50 ug via INTRAVENOUS
  Filled 2012-02-21: qty 2

## 2012-02-21 MED ORDER — SODIUM CHLORIDE 0.9 % IV SOLN
INTRAVENOUS | Status: AC | PRN
  Administered 2012-02-21: 13:00:00 via INTRAVENOUS

## 2012-02-21 MED ORDER — WARFARIN SODIUM 5 MG PO TABS
5.0000 mg | ORAL_TABLET | Freq: Once | ORAL | Status: AC
  Administered 2012-02-21: 5 mg via ORAL
  Filled 2012-02-21: qty 1

## 2012-02-21 MED ORDER — MIDAZOLAM HCL 2 MG/2ML IJ SOLN
1.0000 mg | Freq: Once | INTRAMUSCULAR | Status: AC | PRN
  Administered 2012-02-21: 2 mg via INTRAVENOUS
  Filled 2012-02-21: qty 4

## 2012-02-21 NOTE — Progress Notes (Signed)
ANTICOAGULATION CONSULT NOTE - Follow Up Consult  Pharmacy Consult for Heparin + Coumadin Indication: atrial fibrillation  No Known Allergies  Vital Signs: Temp: 97.8 F (36.6 C) (08/08 1145) Temp src: Oral (08/08 1145) BP: 87/59 mmHg (08/08 1145) Pulse Rate: 89  (08/08 1145)  Labs:  Basename 02/21/12 1200 02/21/12 0449 02/20/12 0359 02/19/12 0500  HGB -- 13.1 13.3 --  HCT -- 38.9* 39.1 35.0*  PLT -- 212 196 181  APTT -- -- -- --  LABPROT -- 22.4* 16.4* 16.6*  INR -- 1.93* 1.30 1.32  HEPARINUNFRC 0.62 0.21* 0.45 --  CREATININE -- 1.08 1.11 --  CKTOTAL -- -- -- --  CKMB -- -- -- --  TROPONINI -- -- -- --    Estimated Creatinine Clearance: 65.4 ml/min (by C-G formula based on Cr of 1.08).   Medications:  Heparin @ 1400 units/hr  Assessment: 70yom continues on coumadin with heparin bridge for afib s/p IABP removal. Heparin level is now therapeutic after rate increase this morning. INR is finally trending up after two 7.5mg  doses. Will resume home dose today. No bleeding noted. CBC stable. Continues on amiodarone. For TEE/DCCV today.  Goal of Therapy:  INR 2-3 Heparin level 0.3-0.7 Monitor platelets by anticoagulation protocol: Yes   Plan:  1) Continue heparin at 1400 units/hr 2) Coumadin 5mg  x 1 3) Follow up INR, heparin level in AM  [name] 02/21/2012,12:58 PM

## 2012-02-21 NOTE — Progress Notes (Signed)
*  PRELIMINARY RESULTS* Echocardiogram Echocardiogram Transesophageal has been performed.  Jeryl Columbia 02/21/2012, 2:54 PM

## 2012-02-21 NOTE — Evaluation (Signed)
Physical Therapy Evaluation Patient Details Name: Gregg [name] MRN: [id number] DOB: 1940/10/01 Today's Date: 02/21/2012 Time: 4098-1191 PT Time Calculation (min): 23 min  PT Assessment / Plan / Recommendation Clinical Impression  Pt. was admitted with cardiogenicshock, AC systolic heart failure, CKD and possible apical thrombus.  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  Presents with decline in his functional mobility and gait, and generalized weakness  due to acute illness.  will benefit from acute PT to address his therapy issues and allow him to advance to return home at DC.    PT Assessment  Patient needs continued PT services    Follow Up Recommendations  Home health PT;Supervision/Assistance - 24 hour    Barriers to Discharge None      Equipment Recommendations  None recommended by PT    Recommendations for Other Services     Frequency Min 3X/week    Precautions / Restrictions Precautions Precautions: None Restrictions Weight Bearing Restrictions: No   Pertinent Vitals/Pain Heart rate and sats stable during PT session      Mobility  Bed Mobility Bed Mobility: Not assessed Transfers Transfers: Sit to Stand;Stand to Sit Sit to Stand: 4: Min assist;From chair/3-in-1 Stand to Sit: 4: Min assist;To chair/3-in-1 Details for Transfer Assistance: cues for technique and hand placement, min assist for balance and stability Ambulation/Gait Ambulation/Gait Assistance: 4: Min assist;Other (comment) (second person for multiple lines/tubes) Ambulation Distance (Feet): 10 Feet Assistive device: Rolling walker Ambulation/Gait Assistance Details: cues for safety and technique, slowed pace Gait Pattern: Step-through pattern Gait velocity: slowed Stairs: No Wheelchair Mobility Wheelchair Mobility: No    Exercises     PT Diagnosis: Difficulty walking;Generalized weakness  PT Problem List: Decreased strength;Decreased activity tolerance;Decreased balance;Decreased  mobility;Decreased knowledge of use of DME;Cardiopulmonary status limiting activity PT Treatment Interventions: DME instruction;Gait training;Functional mobility training;Therapeutic activities;Therapeutic exercise;Balance training;Patient/family education   PT Goals Acute Rehab PT Goals PT Goal Formulation: With patient Time For Goal Achievement: 03/06/12 Potential to Achieve Goals: Good Pt will go Supine/Side to Sit: with modified independence PT Goal: Supine/Side to Sit - Progress: Goal set today Pt will go Sit to Supine/Side: with modified independence PT Goal: Sit to Supine/Side - Progress: Goal set today Pt will go Sit to Stand: with modified independence PT Goal: Sit to Stand - Progress: Goal set today Pt will go Stand to Sit: with modified independence PT Goal: Stand to Sit - Progress: Goal set today Pt will Transfer Bed to Chair/Chair to Bed: with modified independence PT Transfer Goal: Bed to Chair/Chair to Bed - Progress: Goal set today Pt will Ambulate: 51 - 150 feet;with modified independence;with least restrictive assistive device PT Goal: Ambulate - Progress: Goal set today  Visit Information  Last PT Received On: 02/21/12 Assistance Needed: +2 (second person for lines and tubes)    Subjective Data  Subjective: heart failure and stomach brought me here Patient Stated Goal: return to taking care of the lawn   Prior Functioning  Home Living Lives With: Spouse Available Help at Discharge: Family;Available 24 hours/day Type of Home: House Home Access: Stairs to enter [employer] of Steps: 1 Entrance Stairs-Rails: None Home Layout: Two level;Laundry or work area in Artist of Steps: 10 Alternate Level Stairs-Rails: Right;Left Bathroom Shower/Tub: Forensic scientist: Standard Home Adaptive Equipment: Walker - rolling;Bedside commode/3-in-1 Prior Function Level of Independence: Independent Able to Take  Stairs?: Yes Driving: Yes Vocation: Retired Musician: No difficulties Dominant Hand: Right    Cognition  Overall Cognitive Status:  Appears within functional limits for tasks assessed/performed Arousal/Alertness: Awake/alert Orientation Level: Appears intact for tasks assessed Behavior During Session: [facility] for tasks performed    Extremity/Trunk Assessment Right Upper Extremity Assessment RUE ROM/Strength/Tone: WFL for tasks assessed RUE Sensation: WFL - Light Touch Left Upper Extremity Assessment LUE ROM/Strength/Tone: WFL for tasks assessed LUE Sensation: WFL - Light Touch Right Lower Extremity Assessment RLE ROM/Strength/Tone: WFL for tasks assessed RLE Sensation: WFL - Light Touch Left Lower Extremity Assessment LLE ROM/Strength/Tone: WFL for tasks assessed Trunk Assessment Trunk Assessment: Normal   Balance    End of Session PT - End of Session Equipment Utilized During Treatment: Gait belt Activity Tolerance: Patient tolerated treatment well Patient left: in chair;with call bell/phone within reach Nurse Communication: Mobility status  GP     Ferman Hamming 02/21/2012, 1:38 PM Weldon Picking PT Acute Rehab Services [phone] Beeper [phone]

## 2012-02-21 NOTE — Progress Notes (Signed)
Advanced Heart Failure Rounding Note   Subjective:   Mr. Volden is a 71 year old male, followed by Dr. Daryel November in [place], with a history of dilated nonischemic cardiomyopathy, reportedly diagnosed following evaluation at [facility] back in 2005. He has no reported history of obstructive coronary artery disease, underwent interval placement of a Guidant defibrillator with reported history of ventricular tachycardia, and has a left ventricular ejection fraction of approximately 20% to 25% with possibility of apical thrombus based on outside echocardiogram done just recently in June. Additional history includes atrial fibrillation on Coumadin; and episodic acute-on-chronic renal failure, creatinine now 1.4.  02/14/12 RHC/ LHC Normal coronary arteries.  Ao Pressure: 79/68 (55)  LV Pressure: 82/18 (26)  Pulmonary artery sats 27% and 28% RA 21 PA 59/45 (52)  PCWP 33  Fick 2.1/1.1 PVR 9.4  Echo EF 20-25% Mild RV dysfunction. Mild MR.  IABP inserted 8/1 and Milrinone started 0.25 mcg.  IABP removed 02/16/12.  Remains on milrinone.  VQ low prob on 8/5   CO-OX 28.3>27.4>>>>>74.7  Swan removed yesterday with placement of PICC line. Denies SOB/PND/orthopnea/CP.  No HA. Weight stable.     Objective:    Vital Signs:   Temp:  [97.4 F (36.3 C)-98.2 F (36.8 C)] 97.4 F (36.3 C) (08/08 0700) Pulse Rate:  [41-99] 73  (08/08 0700) Resp:  [13-27] 13  (08/08 0700) BP: (84-110)/(44-75) 94/44 mmHg (08/08 0700) SpO2:  [92 %-97 %] 95 % (08/08 0700) Weight:  [71.6 kg (157 lb 13.6 oz)] 71.6 kg (157 lb 13.6 oz) (08/08 0500) Last BM Date: 02/21/12  Weight change: Filed Weights   02/19/12 0500 02/20/12 0500 02/21/12 0500  Weight: 72.9 kg (160 lb 11.5 oz) 71.442 kg (157 lb 8 oz) 71.6 kg (157 lb 13.6 oz)    Intake/Output:   Intake/Output Summary (Last 24 hours) at 02/21/12 0831 Last data filed at 02/21/12 0800  Gross per 24 hour  Intake 1138.07 ml  Output   1550 ml  Net -411.93 ml     Physical Exam: General:  Lying flat. No distress.  Neck: supple. JVP 5. Carotids 2+ bilat; no bruits. No lymphadenopathy or thryomegaly appreciated. Cor: PMI laterally displaced. Irregular rate & rhythm.3/6 MR. +s3 Lungs: clear Abdomen: soft, nontender, nondistended. No hepatosplenomegaly. No bruits or masses. Good bowel sounds. Extremities: no cyanosis, clubbing, rash, edema Neuro: alert & orientedx3, cranial nerves grossly intact. moves all 4 extremities w/o difficulty. Affect pleasant  Telemetry: A-fib 70-80s  Labs: Basic Metabolic Panel:  Lab 02/21/12 1610 02/20/12 0359 02/18/12 0500 02/17/12 0135 02/16/12 0800 02/14/12 1446  NA 132* 133* 135 135 140 --  K 4.0 4.0 3.7 3.9 3.4* --  CL 96 97 97 100 102 --  CO2 28 29 29 29 28  --  GLUCOSE 92 115* 89 112* 58* --  BUN 17 18 17 21  27* --  CREATININE 1.08 1.11 0.95 0.95 0.96 --  CALCIUM 10.7* 11.0* 10.6* -- -- --  MG -- -- -- -- -- 1.5  PHOS -- -- -- -- -- --    Liver Function Tests:  Lab 02/14/12 1446  AST 45*  ALT 38  ALKPHOS 77  BILITOT 1.6*  PROT 6.6  ALBUMIN 3.1*   No results found for this basename: LIPASE:5,AMYLASE:5 in the last 168 hours No results found for this basename: AMMONIA:3 in the last 168 hours  CBC:  Lab 02/21/12 0449 02/20/12 0359 02/19/12 0500 02/18/12 0500 02/17/12 0135 02/14/12 1446  WBC 5.2 7.6 6.2 6.4 5.6 --  NEUTROABS -- -- -- -- --  3.9  HGB 13.1 13.3 11.9* 11.5* 11.1* --  HCT 38.9* 39.1 35.0* 33.9* 32.5* --  MCV 86.3 85.7 85.8 87.4 87.1 --  PLT 212 196 181 200 205 --     BNP (last 3 results)  Basename 02/14/12 1446  PROBNP 22343.0*     Other results:    Imaging: Dg Chest Port 1 View  02/20/2012  *RADIOLOGY REPORT*  Clinical Data: Line placement  PORTABLE CHEST - 1 VIEW  Comparison: 02/18/2012  Findings: Swan-Ganz catheter removed.  Right IJ vascular sheath remains.  Interval insertion of a right PICC line, tip in the SVC. Cardiomegaly with vascular congestion.  No CHF,  pneumonia, effusion or pneumothorax.  IMPRESSION: Right PICC line tip SVC.  Stable chest exam.  Original Report Authenticated By: Judie Petit. Ruel Favors, M.D.     Medications:     Scheduled Medications:    . amiodarone  400 mg Oral Daily  . antiseptic oral rinse  15 mL Mouth Rinse BID  . aspirin EC  81 mg Oral Daily  . digoxin  0.125 mg Oral Daily  . enalapril  2.5 mg Oral BID  . fenofibrate  160 mg Oral Daily  . furosemide  40 mg Oral BID  . magnesium oxide  400 mg Oral BID  . pantoprazole  40 mg Oral Q1200  . potassium chloride  40 mEq Oral Daily  . senna  1 tablet Oral Daily  . sildenafil  20 mg Oral TID  . sodium chloride  10-40 mL Intracatheter Q12H  . sodium chloride  3 mL Intravenous Q12H  . sodium chloride  3 mL Intravenous Q12H  . ursodiol  300 mg Oral BID  . warfarin  7.5 mg Oral ONCE-1800  . Warfarin - Pharmacist Dosing Inpatient   Does not apply q1800  . DISCONTD: furosemide  60 mg Oral BID    Infusions:    . sodium chloride 250 mL (02/18/12 1900)  . sodium chloride 10 mL/hr at 02/18/12 1900  . sodium chloride 250 mL (02/20/12 1345)  . sodium chloride    . heparin 1,400 Units/hr (02/21/12 0544)  . milrinone 0.25 mcg/kg/min (02/20/12 2220)  . DISCONTD: sodium chloride      PRN Medications: acetaminophen, alum & mag hydroxide-simeth, benzonatate, HYDROcodone-acetaminophen, ondansetron (ZOFRAN) IV, sodium chloride, sodium chloride, sodium chloride, DISCONTD: sodium chloride   Assessment:   1. Cardiogenic Shock 2. A/C systolic heart failure 3. A-fib - on chronic coumadin (has been in AF since January per patient report) 4. CKD (baseline 1.4) 5. Possible Apical thrombus 6. NICM with normal cors cath 02/14/12. EF 20-25% by echo with only mild RV dysfunction  Plan/Discussion:    Doing well on milrinone and revatio but blood pressure remains soft so will hold off on b-blocker at this time.  Continue low dose ACE-I.  Weight stable. Creatinine up slightly. Hold  lasix today and resume at 40 mg BID tomorrow.   Plan for TEE/DC-CV this morning.  Heparin restarted 8/3.  Coumadin on board.    Will try to get home in the next few days and proceed with evaluation for transplant/VAD. (blood type B+) as R-sided hemodynamics permit.  Abbigayle Toole,MD 8:35 AM  Length of Stay: 7

## 2012-02-21 NOTE — Transfer of Care (Signed)
Immediate Anesthesia Transfer of Care Note  Patient: Gregg [name]  Procedure(s) Performed: * No procedures listed *  Patient Location: PACU and Endoscopy Unit  Anesthesia Type: patient's procedure cancelled  Level of Consciousness: awake  Airway & Oxygen Therapy: Patient Spontanous Breathing  Post-op Assessment: patient procedure cancelled  Post vital signs: Reviewed  Complications: patient procedure cancelled
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
## 2012-02-21 NOTE — Preoperative (Signed)
Beta Blockers   Reason not to administer Beta Blockers:Hold beta blocker due to hypotension 

## 2012-02-21 NOTE — Progress Notes (Signed)
ANTICOAGULATION CONSULT NOTE - Follow Up Consult  Pharmacy Consult for heparin Indication: atrial fibrillation  Labs:  Basename 02/21/12 0449 02/20/12 0359 02/19/12 0500  HGB 13.1 13.3 --  HCT 38.9* 39.1 35.0*  PLT 212 196 181  APTT -- -- --  LABPROT 22.4* 16.4* 16.6*  INR 1.93* 1.30 1.32  HEPARINUNFRC 0.21* 0.45 0.47  CREATININE 1.08 1.11 --  CKTOTAL -- -- --  CKMB -- -- --  TROPONINI -- -- --    Assessment: 71yo male now subtherapeutic on heparin, heparin levels have not yet been too stable during this admission.  Goal of Therapy:  Heparin level 0.3-0.7 units/ml   Plan:  Will increase heparin gtt by 1 unit/kg/hr to 1400 units/hr and check level in 6hr.  Colleen Can PharmD BCPS 02/21/2012,5:44 AM

## 2012-02-21 NOTE — Anesthesia Preprocedure Evaluation (Addendum)
Anesthesia Evaluation  Patient identified by MRN, date of birth, ID band Patient awake    Reviewed: Allergy & Precautions, H&P , NPO status , Patient's Chart, lab work & pertinent test results, reviewed documented beta blocker date and time   Airway Mallampati: II TM Distance: >3 FB Neck ROM: Full    Dental  (+) Teeth Intact and Dental Advisory Given   Pulmonary    + decreased breath sounds      Cardiovascular + dysrhythmias Atrial Fibrillation and Ventricular Tachycardia + pacemaker + Cardiac Defibrillator Rhythm:Irregular Rate:Normal  2005 Nonischemic cardiomyopathy. EF 20-25%. Guidant ICD/pacemaker placed. In/out abif since February 2013. Admit 02/14/12 cardiogenic shock. Cath and IABP. EF 20-25% Mild MR. ECHO 02/15/12  02/16/12 IABP out.   Neuro/Psych    GI/Hepatic GERD-  Controlled,  Endo/Other    Renal/GU CRFRenal disease     Musculoskeletal   Abdominal   Peds  Hematology   Anesthesia Other Findings   Reproductive/Obstetrics                         Anesthesia Physical Anesthesia Plan Anesthesia Quick Evaluation

## 2012-02-22 LAB — CARBOXYHEMOGLOBIN
Carboxyhemoglobin: 1.3 % (ref 0.5–1.5)
Methemoglobin: 0.8 % (ref 0.0–1.5)
O2 Saturation: 65.6 %
Total hemoglobin: 13.7 g/dL (ref 13.5–18.0)

## 2012-02-22 LAB — CBC
HCT: 37.9 % — ABNORMAL LOW (ref 39.0–52.0)
MCHC: 33.8 g/dL (ref 30.0–36.0)
Platelets: 229 10*3/uL (ref 150–400)
RDW: 16 % — ABNORMAL HIGH (ref 11.5–15.5)
WBC: 6 10*3/uL (ref 4.0–10.5)

## 2012-02-22 LAB — PROTIME-INR
INR: 2.58 — ABNORMAL HIGH (ref 0.00–1.49)
Prothrombin Time: 28.1 seconds — ABNORMAL HIGH (ref 11.6–15.2)

## 2012-02-22 LAB — HEPARIN LEVEL (UNFRACTIONATED): Heparin Unfractionated: 0.56 IU/mL (ref 0.30–0.70)

## 2012-02-22 LAB — BASIC METABOLIC PANEL
BUN: 16 mg/dL (ref 6–23)
Calcium: 10.7 mg/dL — ABNORMAL HIGH (ref 8.4–10.5)
Creatinine, Ser: 1.09 mg/dL (ref 0.50–1.35)
GFR calc Af Amer: 78 mL/min — ABNORMAL LOW (ref >90)

## 2012-02-22 MED ORDER — FUROSEMIDE 40 MG PO TABS: 40.0000 mg | ORAL_TABLET | Freq: Every day | ORAL | Status: DC

## 2012-02-22 MED ORDER — DIGOXIN 125 MCG PO TABS: 0.1250 mg | ORAL_TABLET | Freq: Every day | ORAL | Status: DC

## 2012-02-22 MED ORDER — MILRINONE IN DEXTROSE 200-5 MCG/ML-% IV SOLN: 0.2500 ug/kg/min | INTRAVENOUS | Status: DC

## 2012-02-22 MED ORDER — ASPIRIN 81 MG PO TBEC: 81.0000 mg | DELAYED_RELEASE_TABLET | Freq: Every day | ORAL | Status: AC

## 2012-02-22 MED ORDER — WARFARIN SODIUM 3 MG PO TABS
3.0000 mg | ORAL_TABLET | Freq: Once | ORAL | Status: DC
  Filled 2012-02-22: qty 1

## 2012-02-22 MED ORDER — ENALAPRIL MALEATE 2.5 MG PO TABS: 2.5000 mg | ORAL_TABLET | Freq: Two times a day (BID) | ORAL | Status: DC

## 2012-02-22 MED ORDER — SILDENAFIL CITRATE 20 MG PO TABS: 20.0000 mg | ORAL_TABLET | Freq: Three times a day (TID) | ORAL | Status: DC

## 2012-02-22 MED ORDER — AMIODARONE HCL 400 MG PO TABS: 200.0000 mg | ORAL_TABLET | Freq: Every day | ORAL | Status: DC

## 2012-02-22 NOTE — Progress Notes (Signed)
ANTICOAGULATION CONSULT NOTE - Follow Up Consult  Pharmacy Consult for Heparin + Coumadin Indication: atrial fibrillation  No Known Allergies  Vital Signs: Temp: 98.6 F (37 C) (08/09 0749) Temp src: Oral (08/09 0749) BP: 97/47 mmHg (08/09 0749) Pulse Rate: 78  (08/09 0749)  Labs:  Basename 02/22/12 0450 02/21/12 1200 02/21/12 0449 02/20/12 0359  HGB 12.8* -- 13.1 --  HCT 37.9* -- 38.9* 39.1  PLT 229 -- 212 196  APTT -- -- -- --  LABPROT 28.1* -- 22.4* 16.4*  INR 2.58* -- 1.93* 1.30  HEPARINUNFRC 0.56 0.62 0.21* --  CREATININE 1.09 -- 1.08 1.11  CKTOTAL -- -- -- --  CKMB -- -- -- --  TROPONINI -- -- -- --    Estimated Creatinine Clearance: 64.1 ml/min (by C-G formula based on Cr of 1.09).   Medications:  Prescriptions prior to admission  Medication Sig Dispense Refill  . aspirin EC 81 MG tablet Take 81 mg by mouth daily.      . cetirizine (ZYRTEC) 10 MG tablet Take 10 mg by mouth daily.      Marland Kitchen docusate sodium (COLACE) 100 MG capsule Take 200 mg by mouth daily.      . Fe Fum-FA-B Cmp-C-Zn-Mg-Mn-Cu (HEMATINIC PLUS VIT/MINERALS PO) Take 1 tablet by mouth daily.      . magnesium oxide (MAG-OX) 400 MG tablet Take 800 mg by mouth daily.      . pantoprazole (PROTONIX) 40 MG tablet Take 40 mg by mouth daily.      Marland Kitchen pyridOXINE (VITAMIN B-6) 100 MG tablet Take 100 mg by mouth daily.      Marland Kitchen senna (SENOKOT) 8.6 MG TABS Take 2 tablets by mouth daily.      Marland Kitchen thiamine (VITAMIN B-1) 100 MG tablet Take 100 mg by mouth daily.      . ursodiol (ACTIGALL) 300 MG capsule Take 300 mg by mouth 2 (two) times daily.      Marland Kitchen warfarin (COUMADIN) 3 MG tablet Take 3 mg by mouth daily. 3mg  on Monday,tuesday,wednesday,friday,saturday      . warfarin (COUMADIN) 5 MG tablet Take 5 mg by mouth daily. 5mg  on Thursday and sunday      . DISCONTD: amiodarone (PACERONE) 400 MG tablet Take 200 mg by mouth daily.      Marland Kitchen DISCONTD: carvedilol (COREG) 6.25 MG tablet Take 6.25 mg by mouth 2 (two) times daily with  a meal.      . DISCONTD: furosemide (LASIX) 40 MG tablet Take 40 mg by mouth daily.      Marland Kitchen DISCONTD: lisinopril (PRINIVIL,ZESTRIL) 5 MG tablet Take 5 mg by mouth daily.      Marland Kitchen DISCONTD: tadalafil (CIALIS) 10 MG tablet Take 10 mg by mouth daily as needed.          Admit Complaint: 71 y.o. male admitted 02/14/2012 after tx from [facility] admitted with weakness, fatigue now s/p IABP removal. Pharmacy consulted to dose warfarin.   Assessment: Anticoagulation: Afib, chronic warfarin PTA - resumed 8/5, INR now at goal resume home dose today, stop heparin.  Cardiovascular: NICM (EF 20-25%), CAD, ICD, VT, afib.: On milrinone at 0.25 (co-ox= 74.7). Also on asa81, dig, amio, fenofibrate, enalapril, revatio. Planning evaluation for transplant/VAD. HR 70s, BP 90/40s,  At his dry weight 157lb, Resume lasix at 40 daily, plan to take an extra if weight over 160 lbs.  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 SCr=1.08, UOP  0.5 ml/kg/hr  Hematology / Oncology: chronic anemia, HH trend slightly down, to resume B vitamins and iron at discharge.  Best Practices: INR at goal, home meds resumed    Goal of Therapy:  INR 2-3 Monitor platelets by anticoagulation protocol: Yes   Plan:  1) Discontinue heparin. 2) Coumadin 3mg  x 1, resume home dose 3 mg daily, 5 mg 2 days per week. 3) Follow up INR  Thank you for allowing pharmacy to be a part of this patients care team.  Lovenia Kim Pharm.D., BCPS Clinical Pharmacist 02/22/2012 10:27 AM Pager: [phone] Phone: [phone]

## 2012-02-22 NOTE — Anesthesia Postprocedure Evaluation (Signed)
Case Cancelled

## 2012-02-22 NOTE — Progress Notes (Signed)
Advanced Heart Failure Rounding Note   Subjective:   [name] is a 71 year old male, followed by Dr. Daryel November in [place], with a history of dilated nonischemic cardiomyopathy, reportedly diagnosed following evaluation at [facility] back in 2005. He has no reported history of obstructive coronary artery disease, underwent interval placement of a Guidant defibrillator with reported history of ventricular tachycardia, and has a left ventricular ejection fraction of approximately 20% to 25% with possibility of apical thrombus based on outside echocardiogram done just recently in June. Additional history includes atrial fibrillation on Coumadin; and episodic acute-on-chronic renal failure, creatinine now 1.4.  02/14/12 RHC/ LHC Normal coronary arteries.  Ao Pressure: 79/68 (55)  LV Pressure: 82/18 (26)  Pulmonary artery sats 27% and 28% RA 21 PA 59/45 (52)  PCWP 33  Fick 2.1/1.1 PVR 9.4  Echo EF 20-25% Mild RV dysfunction. Mild MR.  IABP inserted 8/1 and Milrinone started 0.25 mcg.  IABP removed 02/16/12.  Remains on milrinone.  VQ low prob on 8/5   CO-OX 28.3>27.4>>>>>74.7  Doing well. TEE yesterday with LAA clot so no DCCV.   Objective:    Vital Signs:   Temp:  [97.3 F (36.3 C)-98.6 F (37 C)] 98.6 F (37 C) (08/09 0749) Pulse Rate:  [70-90] 78  (08/09 0749) Resp:  [7-22] 22  (08/08 2350) BP: (86-101)/(47-66) 97/47 mmHg (08/09 0749) SpO2:  [89 %-99 %] 96 % (08/09 0749) Weight:  [70.8 kg (156 lb 1.4 oz)] 70.8 kg (156 lb 1.4 oz) (08/09 0500) Last BM Date: 02/21/12  Weight change: Filed Weights   02/20/12 0500 02/21/12 0500 02/22/12 0500  Weight: 71.442 kg (157 lb 8 oz) 71.6 kg (157 lb 13.6 oz) 70.8 kg (156 lb 1.4 oz)    Intake/Output:   Intake/Output Summary (Last 24 hours) at 02/22/12 0813 Last data filed at 02/22/12 0600  Gross per 24 hour  Intake  897.8 ml  Output    700 ml  Net  197.8 ml    Physical Exam: General:  Lying flat. No distress.  Neck: supple.  JVP 5. Carotids 2+ bilat; no bruits. No lymphadenopathy or thryomegaly appreciated. Cor: PMI laterally displaced. Irregular rate & rhythm.3/6 MR. +s3 Lungs: clear Abdomen: soft, nontender, nondistended. No hepatosplenomegaly. No bruits or masses. Good bowel sounds. Extremities: no cyanosis, clubbing, rash, edema Neuro: alert & orientedx3, cranial nerves grossly intact. moves all 4 extremities w/o difficulty. Affect pleasant  Telemetry: A-fib 70-80s  Labs: Basic Metabolic Panel:  Lab 02/22/12 1610 02/21/12 0449 02/20/12 0359 02/18/12 0500 02/17/12 0135  NA 132* 132* 133* 135 135  K 4.3 4.0 4.0 3.7 3.9  CL 99 96 97 97 100  CO2 27 28 29 29 29   GLUCOSE 89 92 115* 89 112*  BUN 16 17 18 17 21   CREATININE 1.09 1.08 1.11 0.95 0.95  CALCIUM 10.7* 10.7* 11.0* -- --  MG -- -- -- -- --  PHOS -- -- -- -- --    Liver Function Tests: No results found for this basename: AST:5,ALT:5,ALKPHOS:5,BILITOT:5,PROT:5,ALBUMIN:5 in the last 168 hours No results found for this basename: LIPASE:5,AMYLASE:5 in the last 168 hours No results found for this basename: AMMONIA:3 in the last 168 hours  CBC:  Lab 02/22/12 0450 02/21/12 0449 02/20/12 0359 02/19/12 0500 02/18/12 0500  WBC 6.0 5.2 7.6 6.2 6.4  NEUTROABS -- -- -- -- --  HGB 12.8* 13.1 13.3 11.9* 11.5*  HCT 37.9* 38.9* 39.1 35.0* 33.9*  MCV 86.5 86.3 85.7 85.8 87.4  PLT 229 212 196 181  200     BNP (last 3 results)  Basename 02/14/12 1446  PROBNP 22343.0*     Other results:    Imaging: Dg Chest Port 1 View  02/20/2012  *RADIOLOGY REPORT*  Clinical Data: Line placement  PORTABLE CHEST - 1 VIEW  Comparison: 02/18/2012  Findings: Swan-Ganz catheter removed.  Right IJ vascular sheath remains.  Interval insertion of a right PICC line, tip in the SVC. Cardiomegaly with vascular congestion.  No CHF, pneumonia, effusion or pneumothorax.  IMPRESSION: Right PICC line tip SVC.  Stable chest exam.  Original Report Authenticated By: Judie Petit. Ruel Favors,  M.D.     Medications:     Scheduled Medications:    . amiodarone  400 mg Oral Daily  . antiseptic oral rinse  15 mL Mouth Rinse BID  . aspirin EC  81 mg Oral Daily  . digoxin  0.125 mg Oral Daily  . enalapril  2.5 mg Oral BID  . fenofibrate  160 mg Oral Daily  . magnesium oxide  400 mg Oral BID  . pantoprazole  40 mg Oral Q1200  . potassium chloride  40 mEq Oral Daily  . senna  1 tablet Oral Daily  . sildenafil  20 mg Oral TID  . sodium chloride  10-40 mL Intracatheter Q12H  . sodium chloride  3 mL Intravenous Q12H  . sodium chloride  3 mL Intravenous Q12H  . ursodiol  300 mg Oral BID  . warfarin  5 mg Oral ONCE-1800  . Warfarin - Pharmacist Dosing Inpatient   Does not apply q1800  . DISCONTD: furosemide  40 mg Oral BID    Infusions:    . sodium chloride 250 mL (02/18/12 1900)  . sodium chloride 5 mL (02/21/12 1000)  . sodium chloride 250 mL (02/20/12 1345)  . sodium chloride    . heparin 1,400 Units/hr (02/21/12 1408)  . milrinone 0.25 mcg/kg/min (02/21/12 1836)    PRN Medications: acetaminophen, alum & mag hydroxide-simeth, benzonatate, fentaNYL, HYDROcodone-acetaminophen, midazolam, ondansetron (ZOFRAN) IV, sodium chloride, sodium chloride, sodium chloride   Assessment:   1. Cardiogenic Shock 2. A/C systolic heart failure 3. A-fib - on chronic coumadin (has been in AF since January per patient report) 4. CKD (baseline 1.4) 5. Possible Apical thrombus 6. NICM with normal cors cath 02/14/12. EF 20-25% by echo with only mild RV dysfunction  Plan/Discussion:    Doing well on milrinone and revatio. Will send home today on current meds with [facility] and PT. Watch BP closely.   F/u in 1 week in clinic. Will need repeat RHC in a few weeks to reassess pressures and PVR and possibly proceed with evaluation for transplant/VAD. (blood type B+).  See d/c summary for full details.  Kimberl Vig,MD 8:13 AM  Length of Stay: 8

## 2012-02-22 NOTE — Progress Notes (Signed)
Physical Therapy Treatment Patient Details Name: Gregg [name] MRN: [id number] DOB: 1941/03/20 Today's Date: 02/22/2012 Time: 4696-2952 PT Time Calculation (min): 18 min  PT Assessment / Plan / Recommendation Comments on Treatment Session  Pt. had several questions regarding use of shower/tub once DC'd home.  Pt. educated that he needs to be cleared by RN or MD for shower prior  due to his PICC line in right upper arm.  Reviewed and     Follow Up Recommendations  Home health PT;Other (comment) (for home safety eval)    Barriers to Discharge        Equipment Recommendations  None recommended by PT    Recommendations for Other Services    Frequency Min 3X/week   Plan Discharge plan remains appropriate    Precautions / Restrictions Precautions Precautions: None;Other (comment) (home IV pump in fanny pack) Restrictions Weight Bearing Restrictions: No   Pertinent Vitals/Pain No pain; HR stable, no distress    Mobility  Bed Mobility Bed Mobility: Not assessed Transfers Transfers: Sit to Stand;Stand to Sit Sit to Stand: 7: Independent;From chair/3-in-1 Stand to Sit: 7: Independent;To chair/3-in-1 Details for Transfer Assistance: pt. demonstrating safe technique this pm, no overt LOB on transitional movements Ambulation/Gait Ambulation/Gait Assistance: 7: Independent Ambulation Distance (Feet): 400 Feet (around unit 2900) Assistive device: None Ambulation/Gait Assistance Details: pt. able to maintain balance with level walking  with one slight self correctiion when he used cross over strategy.  Pt. able to turn quickly to right and left and pick up object off floor without overt LOB. Pt. able to tandem walk a short distance with one minor LOB.      Gait Pattern: Within Functional Limits Gait velocity: slowed (slight slowing possibly due to new home IV pump/fanny pack) Stairs: No Wheelchair Mobility Wheelchair Mobility: No    Exercises     PT Diagnosis:    PT Problem List:     PT Treatment Interventions:     PT Goals Acute Rehab PT Goals PT Goal: Sit to Stand - Progress: Met PT Goal: Stand to Sit - Progress: Met PT Goal: Ambulate - Progress: Met  Visit Information  Last PT Received On: 02/22/12 Assistance Needed: +1    Subjective Data  Subjective: I feel much better today.  I think my activity will come back as I walk more.   Cognition  Overall Cognitive Status: Appears within functional limits for tasks assessed/performed Arousal/Alertness: Awake/alert Orientation Level: Appears intact for tasks assessed Behavior During Session: [facility] for tasks performed    Balance  Balance Balance Assessed: Yes Static Standing Balance Single Leg Stance - Right Leg: 18  Single Leg Stance - Left Leg: 5  Dynamic Standing Balance Dynamic Standing - Balance Support: No upper extremity supported;During functional activity Dynamic Standing - Level of Assistance: 7: Independent High Level Balance High Level Balance Activites: Direction changes;Turns High Level Balance Comments: pt. able to trun rapidly in either direction without LOB  End of Session PT - End of Session Equipment Utilized During Treatment: Gait belt Activity Tolerance: Patient tolerated treatment well Patient left: in chair;with call bell/phone within reach;with family/visitor present Nurse Communication: Mobility status   GP     Ferman Hamming 02/22/2012, 2:35 PM Weldon Picking PT Acute Rehab Services [phone] [place] [phone]

## 2012-02-28 ENCOUNTER — Ambulatory Visit (HOSPITAL_COMMUNITY)
Admit: 2012-02-28 | Discharge: 2012-02-28 | Disposition: A | Discharge status: Home / Self Care | Payer: Medicare Other | Attending: Internal Medicine | Admitting: Internal Medicine

## 2012-02-28 ENCOUNTER — Encounter (HOSPITAL_COMMUNITY): Payer: Self-pay

## 2012-02-28 VITALS — BP 100/58 | HR 72 | Ht 70.0 in | Wt 161.0 lb

## 2012-02-28 DIAGNOSIS — I5022 Chronic systolic (congestive) heart failure: Secondary | ICD-10-CM

## 2012-02-28 DIAGNOSIS — I4891 Unspecified atrial fibrillation: Secondary | ICD-10-CM

## 2012-02-28 MED ORDER — ENALAPRIL MALEATE 5 MG PO TABS: 5.0000 mg | ORAL_TABLET | Freq: Two times a day (BID) | ORAL | Status: DC

## 2012-02-28 NOTE — Progress Notes (Signed)
HPI:  [name] is a 71 y.o. 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 He was admitted to [facility] for heart failure symptoms (abdominal pain, nausea, sluggishness, edema). He was transferred to [facility] for treatment of advanced HF with low output physiology on 02/14/12.   He was taken to the cath lab for [facility] on 8/1 for probable cardiogenic shock. LHC demonstrated normal coronaries. Hemodynamics were Ao Pressure: 79/68 (55), LV Pressure: 82/18 (26), Pulmonary artery sats 27% and 28%, RA 21, PA 59/45 (52), PCWP 33, and Fick 2.1/1.1. IABP was placed in addition to milrinone 0.25 mcg/kg/min support for cardiogenic shock. IABP was weaned and milrinone continued. He did well and was able to be discharged home on home milrinone. Revatio was also started for increased PA pressures and PVR.   Has h/o AF which he has been in since January 2013. We did TEE while in hospital but no DC-CV was attempted to LAA clot.   Returns today for post-hospital f/u. Feels very good. Able to walk around store but has to stop after every aisle. Gets tired if he tries to do more. No dyspnea. No edema. Weighing every day. Weight stable 156-160. Appetite very good. No lightheadedness. Has been unable to obtain Revatio so he is taking his Cialis. Not started on carvedilol due to low output.   Wife says he can be very forgetful. Can go off to do something and then forget what he meant to do. Sometimes can't understand what his wife is saying. It has been progressing over past few months.   HH RN drew blood today.    ROS: All systems negative except as listed in HPI, PMH and Problem List.  Past Medical History  Diagnosis Date  . Dysrhythmia   . ICD (implantable cardiac defibrillator) in place   . GERD (gastroesophageal reflux disease)   .  Chronic kidney disease   . Anemia   . Pacemaker     Current Outpatient Prescriptions  Medication Sig Dispense Refill  . amiodarone (PACERONE) 400 MG tablet Take 0.5 tablets (200 mg total) by mouth daily.      Marland Kitchen aspirin EC 81 MG EC tablet Take 1 tablet (81 mg total) by mouth daily.      . cetirizine (ZYRTEC) 10 MG tablet Take 10 mg by mouth daily.      . digoxin (LANOXIN) 0.125 MG tablet Take 1 tablet (0.125 mg total) by mouth daily.  30 tablet  6  . docusate sodium (COLACE) 100 MG capsule Take 200 mg by mouth daily.      . enalapril (VASOTEC) 2.5 MG tablet Take 1 tablet (2.5 mg total) by mouth 2 (two) times daily.  60 tablet  6  . Fe Fum-FA-B Cmp-C-Zn-Mg-Mn-Cu (HEMATINIC PLUS VIT/MINERALS PO) Take 1 tablet by mouth daily.      . furosemide (LASIX) 40 MG tablet Take 1 tablet (40 mg total) by mouth daily. Once daily but if weight is 160 pounds or greater take 1 tablet twice daily.  45 tablet  3  . magnesium oxide (MAG-OX) 400 MG tablet Take 800 mg by mouth daily.      . milrinone (PRIMACOR) 200-5 MCG/ML-% infusion Inject 19.5 mcg/min into the vein continuous.  100 mL    . Multiple Vitamins-Minerals (MULTIVITAMIN WITH MINERALS) tablet Take 1 tablet by mouth daily.      . pantoprazole (  PROTONIX) 40 MG tablet Take 40 mg by mouth daily.      Marland Kitchen pyridOXINE (VITAMIN B-6) 100 MG tablet Take 100 mg by mouth daily.      Marland Kitchen senna (SENOKOT) 8.6 MG TABS Take 2 tablets by mouth daily.      . sildenafil (REVATIO) 20 MG tablet Take 1 tablet (20 mg total) by mouth 3 (three) times daily.  90 tablet  12  . tadalafil (CIALIS) 10 MG tablet Take 10 mg by mouth daily. Patient has been taking tadalafil 10mg  since 8/9 at discharge due to sildenafil being unavailable at pharmacy. Sildenafil to start 02/29/12.      Marland Kitchen thiamine (VITAMIN B-1) 100 MG tablet Take 100 mg by mouth daily.      . ursodiol (ACTIGALL) 300 MG capsule Take 300 mg by mouth 2 (two) times daily.      Marland Kitchen warfarin (COUMADIN) 3 MG tablet Take 3 mg by mouth  daily. 3mg  on Monday,tuesday,wednesday,friday,saturday      . warfarin (COUMADIN) 5 MG tablet Take 5 mg by mouth daily. 5mg  on Thursday and sunday       No current facility-administered medications for this encounter.   Facility-Administered Medications Ordered in Other Encounters  Medication Dose Route Frequency Provider Last Rate Last Dose  . 0.9 %  sodium chloride infusion    Continuous PRN Lovie Chol, CRNA         PHYSICAL EXAM: Filed Vitals:   02/28/12 1403  BP: 100/58  Pulse: 72   Weight change:  General:  Well appearing. No resp difficulty HEENT: normal Neck: supple. JVP flat. Carotids 2+ bilaterally; no bruits. No lymphadenopathy or thryomegaly appreciated. Cor: PMI laterally displaced. +s3 Irregular rate & rhythm. No rubs. 2/6 MR Lungs: clear Abdomen: soft, nontender, nondistended. No hepatosplenomegaly. No bruits or masses. Good bowel sounds. Extremities: no cyanosis, clubbing, rash, edema Neuro: alert & orientedx3, cranial nerves grossly intact. Moves all 4 extremities w/o difficulty. Affect pleasant.      ASSESSMENT & PLAN:

## 2012-02-28 NOTE — Assessment & Plan Note (Addendum)
Chronic. Rate controlled. Recent TEE with LAA clot. Continue coumadin.

## 2012-02-28 NOTE — Assessment & Plan Note (Addendum)
He is much improved on IV milrinone but is clearly inotrope dependent with end-stage HF. Ideally would want to pursue VAD or transplant work-up but we need to have neuro-cognitive testing first and also repeat RHC to re-assess pulmonary pressures and PVR. We will arrange neurocognitive testing as a first step. Reinforced need for daily weights and reviewed use of sliding scale diuretics. Increase enalapril 5 mg bid. No b-blocker now. Hopefully will get Revatio tomorrow. F/u on labs. See [place] back in 3 weeks.

## 2012-02-28 NOTE — Patient Instructions (Addendum)
Increase Enalapril to 5 mg Twice daily   Your physician recommends that you schedule a follow-up appointment in: 3 weeks

## 2012-03-03 ENCOUNTER — Telehealth (HOSPITAL_COMMUNITY): Payer: Self-pay | Admitting: Cardiology

## 2012-03-03 NOTE — Telephone Encounter (Signed)
Pt would like to know where they can get labs done closer to home?

## 2012-03-04 NOTE — Telephone Encounter (Signed)
Spoke w/pt's wife, she has found a lab close to them and she is going to call his pcp, Dr Orlin Hilding, to manage and dose, she will get him to refill prescription for coumadin, if she has any trouble she will call me back

## 2012-03-07 NOTE — Discharge Summary (Signed)
Advanced Heart Failure Team  Discharge Summary   Patient ID: Gregg [name] MRN: [id number], DOB/AGE: 1941-06-05 71 y.o. Admit date: 02/14/2012 D/C date:     03/07/2012   Primary Discharge Diagnoses:  1. Cardiogenic Shock  2. A/C systolic heart failure  3. A-fib - on chronic coumadin (has been in AF since January per patient report)  4. CKD (baseline 1.4)  5. LAA clot 6. NICM with normal cors cath 02/14/12. EF 20-25% by echo with only mild RV dysfunction  Secondary Discharge Diagnoses:  1. History of VT s/p VT  Hospital Course:  Gregg [name] is a 71 y.o. 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  He was admitted to [facility] for heart failure symptoms (abdominal pain, nausea, sluggishness, edema) and was transferred to [facility] for treatment of advanced HF with low output physiology on 02/14/12.  He was taken to the cath lab for [facility] on 8/1 for probable cardiogenic shock.  LHC demonstrated normal coronaries.  Hemodynamics were Ao Pressure: 79/68 (55), LV Pressure: 82/18 (26), Pulmonary artery sats 27% and 28%, RA 21,  PA 59/45 (52), PCWP 33, and Fick 2.1/1.1.  IABP was placed in addition to milrinone 0.25 mcg/kg/min support for cardiogenic shock.  Central venous catheter was left in place to measure hemodynamics.  Co-ox improved to 83.9 with improvement in symptoms.  Hydralazine and nitrates were titrated for afterload reduction.  Swan numbers continued to improve with CVP 3 and PCWP 9 on 8/3.  Co-ox remained above 60% therefore IABP was removed on 8/3.  Hemodynamics were stable but it was noted to have a significant increase in pulmonary vascular resistance, 6.8 WU, despite the milrinone.  Therefore, imdur was stopped and hydralazine weaned for initiation of PDE-5 inhibitor, revatio.  The patient will be maintained on home inotrope  through PICC line.  He tolerated addition of low dose Ace-I but no beta-blocker due to soft blood pressure.  He was weaned from IV lasix and placed back on oral lasix 40 mg twice daily.  The patient will need repeat RHC in several weeks to reassess pressures and PVR for possible transplant/VAD.  Will proceed with outpatient evaluation for transplant/VAD.  Work up in house included echo EF 20-25% Mild RV dysfunction. Mild MR, VQ low probability.   Per the patient, he has been in atrial fibrillation since January.  He has been maintained on amiodarone since that time.  Coumadin was reinitiated after removal of IABP.  He underwent TEE with plans for cardioversion on 8/8 but echo showed smoke and linear clot in left atrial appendage, left atrium with heavy smoke but no PFO/ASD noted.  Cardioversion was not completed due to clot.  Will continue coumadin.  Dr. Allene Dillon, PCP, will be following PT/INR.     Patient ambulated with PT and will need home PT.  This has been arranged with [facility].  He will follow closely in the HF Clinic.    Discharge Weight Range: 156-160 Discharge Vitals: Blood pressure 86/54, pulse 87, temperature 98.3 F (36.8 C), temperature source Oral, resp. rate 22, height 5\' 10"  (1.778 m), weight 70.8 kg (156 lb 1.4 oz), SpO2 97.00%.  Labs: Lab Results  Component Value Date   WBC 6.0 02/22/2012   HGB 12.8* 02/22/2012   HCT 37.9* 02/22/2012   MCV 86.5 02/22/2012   PLT 229 02/22/2012    No results found for this basename:  NA,K,CL,CO2,BUN,CREATININE,CALCIUM,LABALBU,PROT,BILITOT,ALKPHOS,ALT,AST,GLUCOSE in the last 168 hours No results found for this basename: CHOL,  HDL,  LDLCALC,  TRIG   BNP (last 3 results)  Basename 02/14/12 1446  PROBNP 22343.0*    Diagnostic Studies/Procedures   No results found.  Discharge Medications   Medication List  As of 03/07/2012  3:27 PM   STOP taking these medications         carvedilol 6.25 MG tablet      lisinopril 5 MG tablet       tadalafil 10 MG tablet         TAKE these medications         amiodarone 400 MG tablet   Commonly known as: PACERONE   Take 0.5 tablets (200 mg total) by mouth daily.      aspirin 81 MG EC tablet   Take 1 tablet (81 mg total) by mouth daily.      cetirizine 10 MG tablet   Commonly known as: ZYRTEC   Take 10 mg by mouth daily.      digoxin 0.125 MG tablet   Commonly known as: LANOXIN   Take 1 tablet (0.125 mg total) by mouth daily.      docusate sodium 100 MG capsule   Commonly known as: COLACE   Take 200 mg by mouth daily.      furosemide 40 MG tablet   Commonly known as: LASIX   Take 1 tablet (40 mg total) by mouth daily. Once daily but if weight is 160 pounds or greater take 1 tablet twice daily.      HEMATINIC PLUS VIT/MINERALS PO   Take 1 tablet by mouth daily.      magnesium oxide 400 MG tablet   Commonly known as: MAG-OX   Take 800 mg by mouth daily.      milrinone 200-5 MCG/ML-% infusion   Commonly known as: PRIMACOR   Inject 19.5 mcg/min into the vein continuous.      pantoprazole 40 MG tablet   Commonly known as: PROTONIX   Take 40 mg by mouth daily.      pyridOXINE 100 MG tablet   Commonly known as: VITAMIN B-6   Take 100 mg by mouth daily.      senna 8.6 MG Tabs   Commonly known as: SENOKOT   Take 2 tablets by mouth daily.      sildenafil 20 MG tablet   Commonly known as: REVATIO   Take 1 tablet (20 mg total) by mouth 3 (three) times daily.      thiamine 100 MG tablet   Commonly known as: VITAMIN B-1   Take 100 mg by mouth daily.      ursodiol 300 MG capsule   Commonly known as: ACTIGALL   Take 300 mg by mouth 2 (two) times daily.      warfarin 3 MG tablet   Commonly known as: COUMADIN   Take 3 mg by mouth daily. 3mg  on Monday,tuesday,wednesday,friday,saturday      warfarin 5 MG tablet   Commonly known as: COUMADIN   Take 5 mg by mouth daily. 5mg  on Thursday and sunday            Disposition   The patient will be discharged in  stable condition to home. Discharge Orders    Future Appointments: Provider: Department: Dept Phone: Center:   03/19/2012 2:00 PM [facility] [phone] None     Future Orders Please Complete By Expires   Heart failure home  health orders      Comments:   Heart Failure Follow-up Care:  Verify follow-up appointments per Patient Discharge Instructions. Confirm transportation arranged. Reconcile home medications with discharge medication list. Remove discontinued medications from use. Assist patient/caregiver to manage medications using pill box. Home Health Visits: Skilled nurse to admit patient within 24 hours of discharge. Skilled nurse to provide home visit within 72 hours of initial visit. Follow-up visits at least weekly for 3-5 weeks based on patient status. Assessments: Vital signs and oxygen saturation at each visit. Assess home environment for safety concerns, caregiver support and availability of low-sodium foods. Consult Child psychotherapist, PT/OT, Dietitian, and CNA based on assessments. At each visit, assess for increased dyspnea, orthopnea, chest discomfort or chest pain, presence of rales/crackles, peripheral edema, abdominal distention, and/or weight gain of 3 pounds in one day or 5 pounds in one week. Daily Weights and Symptom Monitoring: Provide scales to patient if not available. Teach patient/caregiver to weigh daily before breakfast and after voiding using same sale. Teach patient/caregiver to use HF Zone Tool/Weight Chart to track weights with symptoms and when to take action. For weight gain of 3 pounds or more in 24 hours or 5 pounds in one week, OR worsening symptoms as noted above, notify AHF Clinic or Physician per above. Activity: Develop individualized activity plan with patient/caregiver. Patient Education: Reinforce the 5 Key Messages in the "Living with Heart Failure" book. Reinforce use of HF Zone Tool at each visit to support early symptom  recognition and appropriate action.  PT/INR TO BE DRAWN Tuesday AUGUST 13TH AND FAXED TO DR. TORRES AT [phone]. BMET TO BE FAXED TO DR. Gala Romney [phone]   Questions: Responses:   Heart Failure Follow-up Care Advanced Heart Failure (AHF) Clinic at [phone]   Home Health Visits    Obtain the following labs Basic Metabolic Panel    Other see comments   Lab frequency Weekly   Fax lab results to AHF Clinic at [phone]   Diet Low Sodium Heart Healthy   Daily Weights and Symptom Monitoring Fax weight chart weekly X 2 to AHF Clinic at [phone]   Fluid restrictions: 1500 mL Fluid   Diet - low sodium heart healthy      Increase activity slowly      Heart Failure patients record your daily weight using the same scale at the same time of day      STOP any activity that causes chest pain, shortness of breath, dizziness, sweating, or exessive weakness      ACE Inhibitor / ARB already ordered        Follow-up Information    Follow up with Arvilla Meres, MD on 02/28/2012. (at 2p  (0008))    Contact information:   [address] [place] [address] [phone]            Duration of Discharge Encounter: Greater than 35 minutes  Truman Hayward 3:28 PM

## 2012-03-09 NOTE — Progress Notes (Signed)
HPI:  [name] is a 71 y.o. 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 He was admitted to [facility] for heart failure symptoms (abdominal pain, nausea, sluggishness, edema). He was transferred to [facility] for treatment of advanced HF with low output physiology on 02/14/12.   He was taken to the cath lab for [facility] on 8/1 for probable cardiogenic shock. LHC demonstrated normal coronaries. Hemodynamics were Ao Pressure: 79/68 (55), LV Pressure: 82/18 (26), Pulmonary artery sats 27% and 28%, RA 21, PA 59/45 (52), PCWP 33, and Fick 2.1/1.1. IABP was placed in addition to milrinone 0.25 mcg/kg/min support for cardiogenic shock. IABP was weaned and milrinone continued. He did well and was able to be discharged home on home milrinone. Revatio was also started for increased PA pressures and PVR.   Has h/o AF which he has been in since January 2013. We did TEE while in hospital but no DC-CV was attempted to LAA clot.   Returns today for post-hospital f/u. Feels very good. Able to walk around store but has to stop after every aisle. Gets tired if he tries to do more. No dyspnea. No edema. Weighing every day. Weight stable 156-160. Appetite very good. No lightheadedness. Has been unable to obtain Revatio so he is taking his Cialis. Not started on carvedilol due to low output.   Wife says he can be very forgetful. Can go off to do something and then forget what he meant to do. Sometimes can't understand what his wife is saying. It has been progressing over past few months.   HH RN drew blood today.    ROS: All systems negative except as listed in HPI, PMH and Problem List.  Past Medical History  Diagnosis Date  . Dysrhythmia   . ICD (implantable cardiac defibrillator) in place   . GERD (gastroesophageal reflux disease)   .  Chronic kidney disease   . Anemia   . Pacemaker     Current Outpatient Prescriptions  Medication Sig Dispense Refill  . amiodarone (PACERONE) 400 MG tablet Take 0.5 tablets (200 mg total) by mouth daily.      Marland Kitchen aspirin EC 81 MG EC tablet Take 1 tablet (81 mg total) by mouth daily.      . cetirizine (ZYRTEC) 10 MG tablet Take 10 mg by mouth daily.      . digoxin (LANOXIN) 0.125 MG tablet Take 1 tablet (0.125 mg total) by mouth daily.  30 tablet  6  . docusate sodium (COLACE) 100 MG capsule Take 200 mg by mouth daily.      . enalapril (VASOTEC) 5 MG tablet Take 1 tablet (5 mg total) by mouth 2 (two) times daily.  60 tablet  6  . Fe Fum-FA-B Cmp-C-Zn-Mg-Mn-Cu (HEMATINIC PLUS VIT/MINERALS PO) Take 1 tablet by mouth daily.      . furosemide (LASIX) 40 MG tablet Take 1 tablet (40 mg total) by mouth daily. Once daily but if weight is 160 pounds or greater take 1 tablet twice daily.  45 tablet  3  . magnesium oxide (MAG-OX) 400 MG tablet Take 800 mg by mouth daily.      . milrinone (PRIMACOR) 200-5 MCG/ML-% infusion Inject 19.5 mcg/min into the vein continuous.  100 mL    . Multiple Vitamins-Minerals (MULTIVITAMIN WITH MINERALS) tablet Take 1 tablet by mouth daily.      . pantoprazole (  PROTONIX) 40 MG tablet Take 40 mg by mouth daily.      Marland Kitchen pyridOXINE (VITAMIN B-6) 100 MG tablet Take 100 mg by mouth daily.      Marland Kitchen senna (SENOKOT) 8.6 MG TABS Take 2 tablets by mouth daily.      . sildenafil (REVATIO) 20 MG tablet Take 1 tablet (20 mg total) by mouth 3 (three) times daily.  90 tablet  12  . tadalafil (CIALIS) 10 MG tablet Take 10 mg by mouth daily. Patient has been taking tadalafil 10mg  since 8/9 at discharge due to sildenafil being unavailable at pharmacy. Sildenafil to start 02/29/12.      Marland Kitchen thiamine (VITAMIN B-1) 100 MG tablet Take 100 mg by mouth daily.      . ursodiol (ACTIGALL) 300 MG capsule Take 300 mg by mouth 2 (two) times daily.      Marland Kitchen warfarin (COUMADIN) 3 MG tablet Take 3 mg by mouth  daily. 3mg  on Monday,tuesday,wednesday,friday,saturday      . warfarin (COUMADIN) 5 MG tablet Take 5 mg by mouth daily. 5mg  on Thursday and sunday       No current facility-administered medications for this encounter.   Facility-Administered Medications Ordered in Other Encounters  Medication Dose Route Frequency Provider Last Rate Last Dose  . 0.9 %  sodium chloride infusion    Continuous PRN Lovie Chol, CRNA         PHYSICAL EXAM: Filed Vitals:   02/28/12 1403  BP: 100/58  Pulse: 72   Weight change:  General:  Well appearing. No resp difficulty HEENT: normal Neck: supple. JVP flat. Carotids 2+ bilaterally; no bruits. No lymphadenopathy or thryomegaly appreciated. Cor: PMI laterally displaced. +s3 Irregular rate & rhythm. No rubs. 2/6 MR Lungs: clear Abdomen: soft, nontender, nondistended. No hepatosplenomegaly. No bruits or masses. Good bowel sounds. Extremities: no cyanosis, clubbing, rash, edema Neuro: alert & orientedx3, cranial nerves grossly intact. Moves all 4 extremities w/o difficulty. Affect pleasant.      ASSESSMENT & PLAN:

## 2012-03-19 ENCOUNTER — Ambulatory Visit (HOSPITAL_COMMUNITY)
Admission: RE | Admit: 2012-03-19 | Discharge: 2012-03-19 | Disposition: A | Discharge status: Home / Self Care | Payer: Medicare Other | Source: Ambulatory Visit | Attending: Internal Medicine | Admitting: Internal Medicine

## 2012-03-19 VITALS — BP 86/52 | HR 64 | Wt 167.4 lb

## 2012-03-19 DIAGNOSIS — I5022 Chronic systolic (congestive) heart failure: Secondary | ICD-10-CM

## 2012-03-19 NOTE — Patient Instructions (Addendum)
Follow up in 3 weeks with dr Gala Romney  Take an extra lasix if your weight is 167 pounds or greater in a 24 hour period.   Do the following things EVERYDAY: 1) Weigh yourself in the morning before breakfast. Write it down and keep it in a log. 2) Take your medicines as prescribed 3) Eat low salt foods-Limit salt (sodium) to 2000 mg per day.  4) Stay as active as you can everyday 5) Limit all fluids for the day to less than 2 liters

## 2012-03-19 NOTE — Assessment & Plan Note (Signed)
Functionally improving. Dyspnea resolved. Volume status stable despite 6 pound weight gain. Continue Milrinone via PICC. Will not titrate HF medications due to soft SBP.  Keep off beta blocker due to low output. Plan for neurocognitive work up 03/21/12 with Dr Lucianne Muss and based on these results may need RHC if advanced therapies desired.  Follow up  in 3 weeks Dr Gala Romney to discuss advanced therapies.

## 2012-03-19 NOTE — Progress Notes (Signed)
Patient ID: Gregg [name], male   DOB: 09-21-1940, 71 y.o.   MRN: [id number] HPI:  [name] is a 71 y.o. 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 He was admitted to [facility] for heart failure symptoms (abdominal pain, nausea, sluggishness, edema). He was transferred to [facility] for treatment of advanced HF with low output physiology on 02/14/12.   He was taken to the cath lab for [facility] on 8/1 for probable cardiogenic shock. LHC demonstrated normal coronaries. Hemodynamics were Ao Pressure: 79/68 (55), LV Pressure: 82/18 (26), Pulmonary artery sats 27% and 28%, RA 21, PA 59/45 (52), PCWP 33, and Fick 2.1/1.1. IABP was placed in addition to milrinone 0.25 mcg/kg/min support for cardiogenic shock. IABP was weaned and milrinone continued. He did well and was able to be discharged home on home milrinone. Revatio was also started for increased PA pressures and PVR.   Has h/o AF which he has been in since January 2013. We did TEE while in hospital but no DC-CV was attempted to LAA clot.   He returns for follow up. Last visit enalapril increased to 5 mg bid. Not on b-blocker due to low output. Revatio started and Cialis stopped. Feels great and wants to have sex.  Denies SOB/PND/Orthopnea/CP. Denies dizziness. Followed by [facility] for PICC and Milrinone. Excellent appetite. Weight at home 159-164 pounds. More active at home. Mowing grass with riding lawn more and weeding flower beds. No shocks and no problems with bleeding. Plan for  neurocognitive testing 03/21/12.     ROS: All systems negative except as listed in HPI, PMH and Problem List.  Past Medical History  Diagnosis Date  . Dysrhythmia   . ICD (implantable cardiac defibrillator) in place   . GERD (gastroesophageal reflux disease)   . Chronic kidney disease     . Anemia   . Pacemaker     Current Outpatient Prescriptions  Medication Sig Dispense Refill  . amiodarone (PACERONE) 400 MG tablet Take 0.5 tablets (200 mg total) by mouth daily.      Marland Kitchen aspirin EC 81 MG EC tablet Take 1 tablet (81 mg total) by mouth daily.      . cetirizine (ZYRTEC) 10 MG tablet Take 10 mg by mouth daily.      . digoxin (LANOXIN) 0.125 MG tablet Take 1 tablet (0.125 mg total) by mouth daily.  30 tablet  6  . docusate sodium (COLACE) 100 MG capsule Take 200 mg by mouth daily.      . enalapril (VASOTEC) 5 MG tablet Take 1 tablet (5 mg total) by mouth 2 (two) times daily.  60 tablet  6  . Fe Fum-FA-B Cmp-C-Zn-Mg-Mn-Cu (HEMATINIC PLUS VIT/MINERALS PO) Take 1 tablet by mouth daily.      . furosemide (LASIX) 40 MG tablet Take 1 tablet (40 mg total) by mouth daily. Once daily but if weight is 160 pounds or greater take 1 tablet twice daily.  45 tablet  3  . magnesium oxide (MAG-OX) 400 MG tablet Take 800 mg by mouth daily.      . milrinone (PRIMACOR) 200-5 MCG/ML-% infusion Inject 19.5 mcg/min into the vein continuous.  100 mL    . Multiple Vitamins-Minerals (MULTIVITAMIN WITH MINERALS) tablet Take 1 tablet by mouth daily.      . pantoprazole (PROTONIX) 40 MG tablet Take 40 mg by mouth daily.      Marland Kitchen  pyridOXINE (VITAMIN B-6) 100 MG tablet Take 100 mg by mouth daily.      Marland Kitchen senna (SENOKOT) 8.6 MG TABS Take 2 tablets by mouth daily.      . sildenafil (REVATIO) 20 MG tablet Take 1 tablet (20 mg total) by mouth 3 (three) times daily.  90 tablet  12  . tadalafil (CIALIS) 10 MG tablet Take 10 mg by mouth daily. Patient has been taking tadalafil 10mg  since 8/9 at discharge due to sildenafil being unavailable at pharmacy. Sildenafil to start 02/29/12.      Marland Kitchen thiamine (VITAMIN B-1) 100 MG tablet Take 100 mg by mouth daily.      . ursodiol (ACTIGALL) 300 MG capsule Take 300 mg by mouth 2 (two) times daily.      Marland Kitchen warfarin (COUMADIN) 3 MG tablet Take 3 mg by mouth daily. 3mg  on  Monday,tuesday,wednesday,friday,saturday      . warfarin (COUMADIN) 5 MG tablet Take 5 mg by mouth daily. 5mg  on Thursday and sunday       No current facility-administered medications for this encounter.   Facility-Administered Medications Ordered in Other Encounters  Medication Dose Route Frequency Provider Last Rate Last Dose  . 0.9 %  sodium chloride infusion    Continuous PRN Lovie Chol, CRNA         PHYSICAL EXAM: Filed Vitals:   03/19/12 1413  BP: 86/52  Pulse: 64   Weight change:  167.4 (161 pounds) General:  Well appearing. No resp difficulty (wife present) HEENT: normal Neck: supple. JVP 5-6. Carotids 2+ bilaterally; no bruits. No lymphadenopathy or thryomegaly appreciated. Cor: PMI laterally displaced. +s3 Irregular rate & rhythm. No rubs. 2/6 MR Lungs: clear Abdomen: soft, nontender, nondistended. No hepatosplenomegaly. No bruits or masses. Good bowel sounds. Extremities: no cyanosis, clubbing, rash, edema RUE PICC in place Neuro: alert & orientedx3, cranial nerves grossly intact. Moves all 4 extremities w/o difficulty. Affect pleasant.      ASSESSMENT & PLAN:

## 2012-03-21 ENCOUNTER — Ambulatory Visit (INDEPENDENT_AMBULATORY_CARE_PROVIDER_SITE_OTHER): Payer: Medicare Other | Admitting: Psychology

## 2012-03-21 ENCOUNTER — Encounter (HOSPITAL_COMMUNITY): Payer: Self-pay | Admitting: Psychology

## 2012-03-21 DIAGNOSIS — R413 Other amnesia: Secondary | ICD-10-CM

## 2012-03-21 NOTE — Progress Notes (Signed)
Patient:   Gregg [name]   DOB:   [birth year]  MR Number:  [id number]  Location:  [facility] [address] [place] [address] Dept: [phone]           Date of Service:   03/18/2012  Start Time:   11 AM End Time:   12 PM  Provider/Observer:  Hershal Coria PSYD       Billing Code/Service: [phone]  Chief Complaint:     Chief Complaint  Patient presents with  . Memory Loss    Reason for Service:  The patient was referred for neuropsychological/neurocognitive testing as part of the workup for consideration of a ventricular assist device implant and/or heart transplant. 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 There've been reports by both the patient and his wife of forgetfulness and other memory difficulties and because of this there was a need for objective assessment of memory and cognitive functioning to determine whether there was sufficient cognitive and memory abilities needed for successful VAD placement and/or heart transplant.  Current Status:  The patient and his wife both report that while he is forgetful at times and will go somewhere and forget what he was doing there are other times when this has not happened. The patient remains oriented to person place time and situation and overall his long-term memory is intact but does have some retrieval memory problems during the clinical interview. The patient still has some concerns about what the various options he has medically and is looking forward to speaking with his cardiologist regarding these options.  Reliability of Information: The information was provided by review of medical records including those of Arvilla Meres, MD,  clinical interview with the patient, and the inclusion of his wife for these interview questions. The information does appear to be valid and reliable.  Behavioral Observation: Hamad Whyte  presents as a 71 y.o.-year-old Right African American Male who appeared his stated age. his dress was Appropriate and he was Well Groomed and his manners were Appropriate to the situation.  There were some physical limitations noted primarily has to do with  any strength and stamina noted noted.  he displayed an appropriate level of cooperation and motivation.    Interactions:    Active   Attention:   within normal limits  Memory:   There were some mild weaknesses with regard to memory noted during the initial mental status exam. The patient was able to recall the current president but was unable to recall previous president. He actually name Monia Sabal as the previous president but gave indication that he knew that that was not quite right.  Visuo-spatial:   within normal limits  Speech (Volume):  low  Speech:   increased latency of response  Thought Process:  Coherent  Though Content:  WNL  Orientation:   person, place, time/date and situation  Judgment:   Good  Planning:   Good  Affect:    Appropriate  Mood:    Dysphoric  Insight:   Good  Intelligence:   normal  Marital Status/Living:  the patient was born and raised in [facility] [place] [facility]. He is married and this is his third marriage. The patient has a 5 year old daughter, 37 year old son, 4 year old son, and a 76 year old son. He spends  his leisure time going to church and reading the Bible as well as platelets the chart. He and his wife the together with no other occupants.  Current Employment:  the patient is not working and is retired.  Substance Use:  No concerns of substance abuse are reported.    Education:   The patient completed the eighth grade and has no formal education beyond that. He does acknowledge some history of  learning difficulties. He has special interest/tablet in school have to do with athletic pursuits and basketball in particular.  Medical History:   Past Medical History  Diagnosis Date  . Dysrhythmia   . ICD (implantable cardiac defibrillator) in place   . GERD (gastroesophageal reflux disease)   . Chronic kidney disease   . Anemia   . Pacemaker         Outpatient Encounter Prescriptions as of 03/21/2012  Medication Sig Dispense Refill  . amiodarone (PACERONE) 400 MG tablet Take 0.5 tablets (200 mg total) by mouth daily.      Marland Kitchen aspirin EC 81 MG EC tablet Take 1 tablet (81 mg total) by mouth daily.      . cetirizine (ZYRTEC) 10 MG tablet Take 10 mg by mouth daily.      . digoxin (LANOXIN) 0.125 MG tablet Take 1 tablet (0.125 mg total) by mouth daily.  30 tablet  6  . docusate sodium (COLACE) 100 MG capsule Take 200 mg by mouth daily.      . enalapril (VASOTEC) 5 MG tablet Take 1 tablet (5 mg total) by mouth 2 (two) times daily.  60 tablet  6  . Fe Fum-FA-B Cmp-C-Zn-Mg-Mn-Cu (HEMATINIC PLUS VIT/MINERALS PO) Take 1 tablet by mouth daily.      . furosemide (LASIX) 40 MG tablet Take 1 tablet (40 mg total) by mouth daily. Once daily but if weight is 160 pounds or greater take 1 tablet twice daily.  45 tablet  3  . magnesium oxide (MAG-OX) 400 MG tablet Take 800 mg by mouth daily.      . milrinone (PRIMACOR) 200-5 MCG/ML-% infusion Inject 19.5 mcg/min into the vein continuous.  100 mL    . Multiple Vitamins-Minerals (MULTIVITAMIN WITH MINERALS) tablet Take 1 tablet by mouth daily.      . pantoprazole (PROTONIX) 40 MG tablet Take 40 mg by mouth daily.      Marland Kitchen pyridOXINE (VITAMIN B-6) 100 MG tablet Take 100 mg by mouth daily.      Marland Kitchen senna (SENOKOT) 8.6 MG TABS Take 2 tablets by mouth daily.      . sildenafil (REVATIO) 20 MG tablet Take 1 tablet (20 mg total) by mouth 3 (three) times daily.  90 tablet  12  . thiamine (VITAMIN B-1) 100 MG tablet Take 100 mg by mouth daily.      . ursodiol (ACTIGALL)  300 MG capsule Take 300 mg by mouth 2 (two) times daily.      Marland Kitchen warfarin (COUMADIN) 3 MG tablet Take 3 mg by mouth daily. 3mg  on Monday,tuesday,wednesday,friday,saturday      . warfarin (COUMADIN) 5 MG tablet Take 5 mg by mouth daily.        Facility-Administered Encounter Medications as of 03/21/2012  Medication Dose Route Frequency Provider Last Rate Last Dose  . 0.9 %  sodium chloride infusion    Continuous PRN Lovie Chol, CRNA              Sexual History:   History  Sexual Activity  . Sexually Active:  Abuse/Trauma History:  there is no patient reported history of abuse or trauma.  Psychiatric History:   patient denies any prior psychiatric history.  Family Med/Psych History: No family history on file.  Risk of Suicide/Violence: virtually non-existent  the patient does express some concerns about not wanting to be dependent on machines that are hanging all over him to be able to live. However, he does report that he wants to live and would do things that were not to intrusive to extend his life as long as the quality of his life was good.  Impression/DX:   at this point, there do appear to be some mild to moderate short-term memory issues particularly have to do with retrieval problems. [name] long-term memory and ability to learn new information appear to be at least basically intact. There were no indications are clear problems with executive functioning her problem solving. I would suspect that some of the cardia vascular insufficiency may play a role in these current memory difficulties.  Disposition/Plan:   we have set the patient for formal neuropsychological testing consisting of a broad range of neurocognitive test as well as a full memory battery. We will get that completed as soon as possible as the patient has an appointment with his cardiologist at the end of the month and would like to have that information the cardiologist by that time.  Diagnosis:    Axis  I:   1. Memory loss         Axis II: No diagnosis       Axis III:   significant cardiovascular issues and chronic renal failure      Axis IV:  There were no significant psychosocial issues reported and the patient and his wife appear to get along quite well.          Axis V:  61-70 mild symptoms

## 2012-04-02 ENCOUNTER — Ambulatory Visit (INDEPENDENT_AMBULATORY_CARE_PROVIDER_SITE_OTHER): Payer: Medicare Other | Admitting: Psychology

## 2012-04-02 DIAGNOSIS — R413 Other amnesia: Secondary | ICD-10-CM

## 2012-04-04 ENCOUNTER — Telehealth (HOSPITAL_COMMUNITY): Payer: Self-pay | Admitting: Cardiology

## 2012-04-04 NOTE — Telephone Encounter (Signed)
Called to let us know weight has increased some. Appetite has increased as well, denies SOB, chest pain, edema.  Pt does have a nurse with [facility] care going out to see him. Weight 04/03/12 165 04/04/12 167.2     per v.o. Dr Gala Romney he is not concerned with this amount of weight gain at this time.Gregg [name] Pt aware and pts wife aware and voiced understanding

## 2012-04-09 DIAGNOSIS — I5023 Acute on chronic systolic (congestive) heart failure: Secondary | ICD-10-CM

## 2012-04-14 ENCOUNTER — Ambulatory Visit (HOSPITAL_COMMUNITY)
Admission: RE | Admit: 2012-04-14 | Discharge: 2012-04-14 | Disposition: A | Discharge status: Home / Self Care | Payer: Medicare Other | Source: Ambulatory Visit | Attending: Internal Medicine | Admitting: Internal Medicine

## 2012-04-14 ENCOUNTER — Encounter (HOSPITAL_COMMUNITY): Payer: Self-pay

## 2012-04-14 VITALS — BP 98/60 | HR 71 | Ht 70.0 in | Wt 173.8 lb

## 2012-04-14 DIAGNOSIS — I5022 Chronic systolic (congestive) heart failure: Secondary | ICD-10-CM | POA: Insufficient documentation

## 2012-04-14 DIAGNOSIS — I4891 Unspecified atrial fibrillation: Secondary | ICD-10-CM | POA: Insufficient documentation

## 2012-04-14 DIAGNOSIS — I509 Heart failure, unspecified: Secondary | ICD-10-CM | POA: Insufficient documentation

## 2012-04-14 MED ORDER — CARVEDILOL 6.25 MG PO TABS: 3.1250 mg | ORAL_TABLET | Freq: Two times a day (BID) | ORAL | Status: DC

## 2012-04-14 MED ORDER — DIGOXIN 125 MCG PO TABS: 0.1250 mg | ORAL_TABLET | Freq: Every day | ORAL | Status: DC

## 2012-04-14 NOTE — Progress Notes (Signed)
Patient ID: Gregg [name], male   DOB: 1940/09/04, 71 y.o.   MRN: [id number] HPI:  [name] is a 71 y.o. 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 He was admitted to [facility] for heart failure symptoms (abdominal pain, nausea, sluggishness, edema). He was transferred to [facility] for treatment of advanced HF with low output physiology on 02/14/12.   LHC (02/14/12) demonstrated normal coronaries. Hemodynamics were Ao Pressure: 79/68 (55), LV Pressure: 82/18 (26), Pulmonary artery sats 27% and 28%, RA 21, PA 59/45 (52), PCWP 33, and Fick 2.1/1.1. IABP was placed in addition to milrinone 0.25 mcg/kg/min support for cardiogenic shock. IABP was weaned and milrinone continued. He did well and was able to be discharged home on home milrinone. Revatio was also started for increased PA pressures and PVR. Discharge weight in 8/13 was 156 pounds.   Has h/o AF which he has been in since January 2013. We did TEE while in hospital (02/21/12) but no DC-CV was attempted to LAA clot.   Continues on milrinone. Feels well. Able to mow the yard and do all ADLs without problem. Says he can walk around store without problem. Occasional edema. Takes extra lasix as needed. Sleeps on 2 pillows. No PND. Great appetite. On 9/18 underwent neurocognitive testing to assess his candidacy for advanced therapies.  Continues with coumadin last INR 1.7. Dose adjusted.   ROS: All systems negative except as listed in HPI, PMH and Problem List.  Past Medical History  Diagnosis Date  . Dysrhythmia   . ICD (implantable cardiac defibrillator) in place   . GERD (gastroesophageal reflux disease)   . Chronic kidney disease   . Anemia   . Pacemaker     Current Outpatient Prescriptions  Medication Sig Dispense Refill  . amiodarone (PACERONE) 400 MG tablet Take  0.5 tablets (200 mg total) by mouth daily.      Marland Kitchen aspirin EC 81 MG EC tablet Take 1 tablet (81 mg total) by mouth daily.      . cetirizine (ZYRTEC) 10 MG tablet Take 10 mg by mouth daily.      . digoxin (LANOXIN) 0.125 MG tablet Take 1 tablet (0.125 mg total) by mouth daily.  30 tablet  6  . docusate sodium (COLACE) 100 MG capsule Take 200 mg by mouth daily.      . enalapril (VASOTEC) 5 MG tablet Take 1 tablet (5 mg total) by mouth 2 (two) times daily.  60 tablet  6  . Fe Fum-FA-B Cmp-C-Zn-Mg-Mn-Cu (HEMATINIC PLUS VIT/MINERALS PO) Take 1 tablet by mouth daily.      . furosemide (LASIX) 40 MG tablet Take 1 tablet (40 mg total) by mouth daily. Once daily but if weight is 160 pounds or greater take 1 tablet twice daily.  45 tablet  3  . magnesium oxide (MAG-OX) 400 MG tablet Take 800 mg by mouth daily.      . milrinone (PRIMACOR) 200-5 MCG/ML-% infusion Inject 19.5 mcg/min into the vein continuous.  100 mL    . Multiple Vitamins-Minerals (MULTIVITAMIN WITH MINERALS) tablet Take 1 tablet by mouth daily.      . pantoprazole (PROTONIX) 40 MG tablet Take 40 mg by mouth daily.      Marland Kitchen pyridOXINE (VITAMIN B-6) 100 MG tablet Take 100 mg by mouth daily.      Marland Kitchen senna (SENOKOT) 8.6 MG TABS Take 2  tablets by mouth daily.      . sildenafil (REVATIO) 20 MG tablet Take 1 tablet (20 mg total) by mouth 3 (three) times daily.  90 tablet  12  . thiamine (VITAMIN B-1) 100 MG tablet Take 100 mg by mouth daily.      . ursodiol (ACTIGALL) 300 MG capsule Take 300 mg by mouth 2 (two) times daily.      Marland Kitchen warfarin (COUMADIN) 3 MG tablet Take 3 mg by mouth daily. 3mg  on Monday,tuesday,wednesday,friday,saturday      . warfarin (COUMADIN) 5 MG tablet Take 5 mg by mouth daily.        No current facility-administered medications for this encounter.   Facility-Administered Medications Ordered in Other Encounters  Medication Dose Route Frequency Provider Last Rate Last Dose  . 0.9 %  sodium chloride infusion    Continuous PRN  Lovie Chol, CRNA         PHYSICAL EXAM: Filed Vitals:   04/14/12 1409  BP: 98/60  Pulse: 71   Weight change:  167.4 (161 pounds) General:  Well appearing. No resp difficulty (wife present) HEENT: normal Neck: supple. JVP 5-6. Carotids 2+ bilaterally; no bruits. No lymphadenopathy or thryomegaly appreciated. Cor: PMI laterally displaced. +s3 Irregular rate & rhythm. No rubs. 2/6 MR Lungs: clear Abdomen: soft, nontender, nondistended. No hepatosplenomegaly. No bruits or masses. Good bowel sounds. Extremities: no cyanosis, clubbing, rash, edema RUE PICC in place Neuro: alert & orientedx3, cranial nerves grossly intact. Moves all 4 extremities w/o difficulty. Affect pleasant.      ASSESSMENT & PLAN:

## 2012-04-14 NOTE — Progress Notes (Signed)
This is a preliminary interpretation of the neuropsychological testing and an addendum will be revised with complete details but for the intent of getting information needed as quickly as possible   the information below was provided  The patient completed the Wechsler Adult Intelligence Scale and Wechsler Memory Scale. [name] full-scale IQ score shows global intellectual abilities to be in the borderline range following in the 5th percentile in comparison to his age-related peer group. He attained full scale IQ score of 76. [name] verbal IQ score was 81 his performance IQ score 75. This is a significantly impaired score that likely represents significant cognitive decline.  Index score showed a verbal comprehension index score of 86 and in the 18th percentile. Perceptual/organization of visual spatial abilities produced an index score of 69 and the 2nd percentile. Working [employer] fast streamer produced an index score of 75 and percentile rank of 5. Processing speed produced an index score of 84 and is in the 14th percentile. Items making up these index scores shows that the patient had average abilities in his verbal reasoning skills and his social judgment/comprehension was at the low end of the average range. General vocabulary skills was also at the lower end of the average range. Mild impairments were noted with regard to attention/concentration and his general fund of information. The patient significant deficits with regard to his visual reasoning, visual spatial abilities, and visual sequencing abilities.  These basic intellectual scores were used in comparison to memory functioning to produce a predicted score based on his overall global intellectual abilities to determine if there are significant memory deficits or memory loss. The patient produced a working memory index score of 79, which is within normal limits the will be predicted based on his 76 full-scale IQ score. His general memory showed mild  impairments with ability to learn new information of her period of time. Immediate auditory immediate visual showed significant deficits and showed the patient had great  difficulty learning new information both auditory and visually  and the significant deficits were maintained with regard to visual learning and memory. The greatest deficits with memory for learning visual information where as the patient was able to retain the auditory information that he did learn.  Summary:   Patient displayed significant neurocognitive/neuropsychological deficits. His global intellectual functioning falls in the borderline range of functioning and a percentile rank of 5th percentile in comparison to his age-matched peers. [name] showed his greatest relative strength in verbal comprehension. This is primarily related to his verbal reasoning abilities and vocabulary ability social comprehension abilities. Significant impairments were noted with regard to visual spatial abilities. The patient showed significant memory deficits both with immediate learning as well as the ability to retain information over a period of time. [name] visual memory was the most impaired and that was consistent with the deficits or primary deficits noted on the neuropsychological measures. [name] ability to retain visual information over time was significantly impaired, although he was generally able to remember the limited information he was able to initially recall in the  immediate recall format.    As far as practical/real-world interpretations, patient's global neuropsychological performance displayed significant impairments and fell in the borderline range of intellectual/cognitive functioning. This represents significant cognitive deficits but not severe. [name] memory abilities are impaired but he is able to generally remember what is initially learned after a delay of approximately 20 minutes. The patient will  have great difficulty learning new information but if he is given  time is able to remember what was initially  learned. [name] verbal comprehension is in the lower end of the average range and he is clearly able to understand what is being presented to him and his receptive language abilities appear to be intact. He is going to be able to comprehend the instructions as most of his neuropsychological deficits fell in the visual spatial deficits and visual learning and memory functioning areas.

## 2012-04-14 NOTE — Assessment & Plan Note (Addendum)
He is responding very well to inotropic support - can do all his ADLs without problem. We discussed the possibility of advanced therapies including LVAD or transplant (blood type B+). He is interested in transplant but a bit reluctant about VAD. Will await results of his neurocog testing. If this is relatively normal will send to [facility] for transplant evaluation. Will try to titrate on low-dose carvedilol at 3.125 bid as tolerated. Will need echo. Also will need repeat RHC to reassess PVR prior to going for transplant eval.   Addendum: Results of neurocognitive testing: Patient displayed significant neurocognitive/neuropsychological deficits. His global intellectual functioning falls in the borderline range of functioning and a percentile rank of 5th percentile in comparison to his age-matched peers.   Based on these results do not feel he is candidate for VAD or transplant. Will continue milrinone for now.

## 2012-04-14 NOTE — Assessment & Plan Note (Signed)
Rate controlled. Will eventually consider DC-CV once INR therapeutic for > 4 weeks.

## 2012-04-17 ENCOUNTER — Other Ambulatory Visit (HOSPITAL_COMMUNITY): Payer: Self-pay | Admitting: Cardiology

## 2012-04-17 DIAGNOSIS — I5022 Chronic systolic (congestive) heart failure: Secondary | ICD-10-CM

## 2012-04-17 MED ORDER — DIGOXIN 125 MCG PO TABS: 0.1250 mg | ORAL_TABLET | Freq: Every day | ORAL | Status: DC

## 2012-04-17 NOTE — Telephone Encounter (Signed)
Meds were sent to the wrong pharmacy. meds sent to the correct pharmacy

## 2012-04-18 ENCOUNTER — Telehealth (HOSPITAL_COMMUNITY): Payer: Self-pay | Admitting: Cardiology

## 2012-04-18 DIAGNOSIS — I5022 Chronic systolic (congestive) heart failure: Secondary | ICD-10-CM

## 2012-04-18 NOTE — Telephone Encounter (Signed)
Pts. Wife called to let us know that the [facility] nurse was unable to draw labs today after multiple attempts.  Pt would like to have labs drawn at [facility], as they have drawn labs in the past.  [phone]   Order faxed. Can this be a standing order for the future

## 2012-05-01 ENCOUNTER — Other Ambulatory Visit (HOSPITAL_COMMUNITY): Payer: Self-pay | Admitting: *Deleted

## 2012-05-01 MED ORDER — ENALAPRIL MALEATE 5 MG PO TABS: 5.0000 mg | ORAL_TABLET | Freq: Two times a day (BID) | ORAL | Status: DC

## 2012-05-01 NOTE — Telephone Encounter (Signed)
Spoke w/pt's wife, RN was able to draw labs she will let us know if has trouble in the future

## 2012-05-05 ENCOUNTER — Encounter: Payer: Self-pay | Admitting: Internal Medicine

## 2012-05-06 ENCOUNTER — Other Ambulatory Visit (HOSPITAL_COMMUNITY): Payer: Self-pay | Admitting: *Deleted

## 2012-05-06 MED ORDER — CARVEDILOL 6.25 MG PO TABS: 3.1250 mg | ORAL_TABLET | Freq: Two times a day (BID) | ORAL | Status: DC

## 2012-05-10 ENCOUNTER — Encounter (HOSPITAL_COMMUNITY): Payer: Self-pay | Admitting: Emergency Medicine

## 2012-05-10 ENCOUNTER — Telehealth: Payer: Self-pay | Admitting: Cardiology

## 2012-05-10 DIAGNOSIS — Y838 Other surgical procedures as the cause of abnormal reaction of the patient, or of later complication, without mention of misadventure at the time of the procedure: Secondary | ICD-10-CM | POA: Insufficient documentation

## 2012-05-10 DIAGNOSIS — Z95810 Presence of automatic (implantable) cardiac defibrillator: Secondary | ICD-10-CM | POA: Insufficient documentation

## 2012-05-10 DIAGNOSIS — Z79899 Other long term (current) drug therapy: Secondary | ICD-10-CM | POA: Insufficient documentation

## 2012-05-10 DIAGNOSIS — I499 Cardiac arrhythmia, unspecified: Secondary | ICD-10-CM | POA: Insufficient documentation

## 2012-05-10 DIAGNOSIS — Z7901 Long term (current) use of anticoagulants: Secondary | ICD-10-CM | POA: Insufficient documentation

## 2012-05-10 DIAGNOSIS — I428 Other cardiomyopathies: Secondary | ICD-10-CM | POA: Insufficient documentation

## 2012-05-10 DIAGNOSIS — K219 Gastro-esophageal reflux disease without esophagitis: Secondary | ICD-10-CM | POA: Insufficient documentation

## 2012-05-10 DIAGNOSIS — Z7982 Long term (current) use of aspirin: Secondary | ICD-10-CM | POA: Insufficient documentation

## 2012-05-10 DIAGNOSIS — Z862 Personal history of diseases of the blood and blood-forming organs and certain disorders involving the immune mechanism: Secondary | ICD-10-CM | POA: Insufficient documentation

## 2012-05-10 DIAGNOSIS — N189 Chronic kidney disease, unspecified: Secondary | ICD-10-CM | POA: Insufficient documentation

## 2012-05-10 NOTE — Telephone Encounter (Signed)
Received a call from Gregg [name] stating that his PICC line has partially come out. He doesn't know how it happened. There is no bleeding at the site. He is not sure how much of it has come out. He is on a milrinone drip. I instructed him to seek medical attention tonight due to the risk of infection and given that he is on a continuous drip. He stated understanding.  Maudell Stanbrough PA-C 05/10/2012 9:18 PM

## 2012-05-10 NOTE — ED Notes (Addendum)
Pt receiving milirone via picc line and line has broke off beneath the skin and unable to locate broken end on palpation

## 2012-05-11 ENCOUNTER — Emergency Department (HOSPITAL_COMMUNITY)
Admission: EM | Admit: 2012-05-11 | Discharge: 2012-05-11 | Disposition: A | Discharge status: Home / Self Care | Payer: Medicare Other | Attending: Emergency Medicine | Admitting: Emergency Medicine

## 2012-05-11 ENCOUNTER — Emergency Department (HOSPITAL_COMMUNITY): Payer: Medicare Other

## 2012-05-11 DIAGNOSIS — I429 Cardiomyopathy, unspecified: Secondary | ICD-10-CM

## 2012-05-11 NOTE — ED Notes (Signed)
Pt verbalized understanding of diagnosis. Stated he will follow up with Dr Gala Romney at scheduled. Vitals stable. Skin warm and dry. NAD. Ambulatory. Wife at bedside. No further questions at this time.

## 2012-05-11 NOTE — ED Provider Notes (Signed)
History     CSN: [id number]  Arrival date & time 05/10/12  2349   First MD Initiated Contact with Patient 05/11/12 0029      Chief Complaint  Patient presents with  . Vascular Access Problem    RUE PICC line broke off    (Consider location/radiation/quality/duration/timing/severity/associated sxs/prior treatment) HPI  Presents at this is a late entry.  This patient is a middle-aged man with severe cardiomyopathy who is followed by Dr. Pola Corn on. The patient has a right arm PICC line in for which he was receiving continuous infusion of milrinone. Patient presents because his PICC line fell out at about 9 PM last night. He denies arm pain. He denies shortness of breath. He is without complaints.  Past Medical History  Diagnosis Date  . Dysrhythmia   . ICD (implantable cardiac defibrillator) in place   . GERD (gastroesophageal reflux disease)   . Chronic kidney disease   . Anemia   . Pacemaker     Past Surgical History  Procedure Date  . Colon surgery   . Insert / replace / remove pacemaker   . Cholecystectomy     History reviewed. No pertinent family history.  History  Substance Use Topics  . Smoking status: Never Smoker   . Smokeless tobacco: Never Used  . Alcohol Use: No      Review of Systems Gen: negative Nose: no epistaxis or rhinorrhea Mouth: no dental pain, no sore throat Neck: no neck pain Lungs: no SOB, cough, wheezing CV: no chest pain, palpitations, dependent edema or orthopnea Abd: negatuve GU: no dysuria or gross hematuria MSK: no myalgias or arthralgias Neuro: no headache, no focal neurologic deficits Skin: no rash   Allergies  Review of patient's allergies indicates no known allergies.  Home Medications   Current Outpatient Rx  Name Route Sig Dispense Refill  . AMIODARONE HCL 400 MG PO TABS Oral Take 0.5 tablets (200 mg total) by mouth daily.    . ASPIRIN 81 MG PO TBEC Oral Take 1 tablet (81 mg total) by mouth daily.    Marland Kitchen  CARVEDILOL 6.25 MG PO TABS Oral Take 3.125 mg by mouth 2 (two) times daily with a meal.    . CETIRIZINE HCL 10 MG PO TABS Oral Take 10 mg by mouth daily.    Marland Kitchen DIGOXIN 0.125 MG PO TABS Oral Take 1 tablet (0.125 mg total) by mouth daily. 90 tablet 3  . DOCUSATE SODIUM 100 MG PO CAPS Oral Take 100 mg by mouth daily.     . ENALAPRIL MALEATE 5 MG PO TABS Oral Take 5 mg by mouth 2 (two) times daily.    Marland Kitchen HEMATINIC PLUS VIT/MINERALS PO Oral Take 1 tablet by mouth daily.    . FUROSEMIDE 40 MG PO TABS Oral Take 1 tablet (40 mg total) by mouth daily. Once daily but if weight is 160 pounds or greater take 1 tablet twice daily. 45 tablet 3  . MAGNESIUM OXIDE 400 MG PO TABS Oral Take 800 mg by mouth daily.    Marland Kitchen MILRINONE IN DEXTROSE 200-5 MCG/ML-% IV SOLN Intravenous Inject 19.5 mcg/min into the vein continuous. 100 mL   . MULTI-VITAMIN/MINERALS PO TABS Oral Take 1 tablet by mouth daily.    Marland Kitchen PANTOPRAZOLE SODIUM 40 MG PO TBEC Oral Take 40 mg by mouth daily.    Marland Kitchen VITAMIN B-6 100 MG PO TABS Oral Take 100 mg by mouth daily.    . SENNA 8.6 MG PO TABS Oral Take 2 tablets  by mouth daily.    Marland Kitchen SILDENAFIL CITRATE 20 MG PO TABS Oral Take 1 tablet (20 mg total) by mouth 3 (three) times daily. 90 tablet 12  . VITAMIN B-1 100 MG PO TABS Oral Take 100 mg by mouth daily.    Marland Kitchen URSODIOL 300 MG PO CAPS Oral Take 300 mg by mouth 2 (two) times daily.    . WARFARIN SODIUM 5 MG PO TABS Oral Take 7.5 mg by mouth daily.       BP 101/63  Pulse 72  Temp 97.3 F (36.3 C) (Oral)  Resp 14  SpO2 97%  Physical Exam  Gen: well developed and well nourished appearing, no acute distress Head: NCAT Eyes: PERL, EOMI Nose: no epistaixis or rhinorrhea Mouth/throat: mucosa is moist and pink Neck: supple, no stridor, no jvd Lungs: CTA B, no wheezing, rhonchi or rales Abd: obese, soft, nontender Back: no ttp, no cva ttp Skin: no rashese, wnl Neuro: CN ii-xii grossly intact, no focal deficits Ext: no line in place RUE  ED Course    Procedures (including critical care time)  Labs Reviewed - No data to display Dg Chest 1 View  05/11/2012  *RADIOLOGY REPORT*  Clinical Data: Vascular access problem.  Right upper extremity PICC line broken off.  CHEST - 1 VIEW  Comparison: 02/20/2012  Findings: Stable appearance of cardiac pacemaker since the previous study.  Right subclavian and PICC lines were removed since previous study.  No radiopaque catheter fragments are visualized.  No new lines are identified.  Shallow inspiration.  Mild cardiac enlargement with normal pulmonary vascularity.  No focal airspace consolidation.  No blunting of costophrenic angles.  No pneumothorax.  IMPRESSION: No radiopaque line fragments are appreciated.  No new catheters identified.  Cardiac enlargement.   Original Report Authenticated By: Marlon Pel, M.D.      1. Cardiomyopathy    CXR confirms that [facility] line fell out in its entirety.  No residual FB.  22g well functioning angiocath placed in right forearm through which milrinone is infusing well.   MDM  I was told by nursing admin that [facility] line can't be placed at night or over the weekend because appropriate staff not available. Case discussed with cardiologist on call for Dr. Jeannene Patella. He does not believe that patient needs to be admitted. He suggests that the patient be discharge home with PIV in place and continuous infusion of milrinone via PIV. Patient with previously scheduled follow up with Dr. Clarise Cruz early Monday afternoon. Wife will call office in AM to report need for [facility] line replacement. Pt and wife counseled to return to the ED for [facility] line placement on Monday if for some reason this procedure can't be arranged by Dr. Kathi Ludwig office.   Also counseled to return promptly to ED for sx of decompensated heart failure, any problems with PIV or infusion of milrinone. Pt and wife state they are comfortable with plan.         Brandt Loosen, MD 05/11/12 [phone]

## 2012-05-11 NOTE — ED Notes (Addendum)
Per IV RN Erie Noe: pump is running through peripheral IV at 1mg /hr. b Pt has appointment at [facility] Monday morning.

## 2012-05-11 NOTE — ED Notes (Signed)
Pt A.O. X 4. Ambulatory. NAD. Denies pain at the site. Denies any other pain. Skin warm and dry. Wife at bedside. IV team at bedside.

## 2012-05-12 ENCOUNTER — Ambulatory Visit (HOSPITAL_COMMUNITY)
Admission: RE | Admit: 2012-05-12 | Discharge: 2012-05-12 | Disposition: A | Discharge status: Home / Self Care | Payer: Medicare Other | Source: Ambulatory Visit | Attending: Internal Medicine | Admitting: Internal Medicine

## 2012-05-12 ENCOUNTER — Encounter (HOSPITAL_COMMUNITY): Payer: Self-pay

## 2012-05-12 ENCOUNTER — Telehealth (HOSPITAL_COMMUNITY): Payer: Self-pay | Admitting: *Deleted

## 2012-05-12 ENCOUNTER — Ambulatory Visit (HOSPITAL_BASED_OUTPATIENT_CLINIC_OR_DEPARTMENT_OTHER)
Admission: RE | Admit: 2012-05-12 | Discharge: 2012-05-12 | Disposition: A | Discharge status: Home / Self Care | Payer: Medicare Other | Source: Ambulatory Visit | Attending: Internal Medicine | Admitting: Internal Medicine

## 2012-05-12 ENCOUNTER — Other Ambulatory Visit (HOSPITAL_COMMUNITY): Payer: Self-pay | Admitting: Internal Medicine

## 2012-05-12 VITALS — BP 96/60 | HR 70 | Wt 180.0 lb

## 2012-05-12 DIAGNOSIS — I509 Heart failure, unspecified: Secondary | ICD-10-CM | POA: Insufficient documentation

## 2012-05-12 DIAGNOSIS — I5022 Chronic systolic (congestive) heart failure: Secondary | ICD-10-CM

## 2012-05-12 DIAGNOSIS — I079 Rheumatic tricuspid valve disease, unspecified: Secondary | ICD-10-CM | POA: Insufficient documentation

## 2012-05-12 DIAGNOSIS — I379 Nonrheumatic pulmonary valve disorder, unspecified: Secondary | ICD-10-CM | POA: Insufficient documentation

## 2012-05-12 DIAGNOSIS — I059 Rheumatic mitral valve disease, unspecified: Secondary | ICD-10-CM

## 2012-05-12 DIAGNOSIS — M7989 Other specified soft tissue disorders: Secondary | ICD-10-CM | POA: Insufficient documentation

## 2012-05-12 DIAGNOSIS — I4891 Unspecified atrial fibrillation: Secondary | ICD-10-CM

## 2012-05-12 MED ORDER — MAGNESIUM OXIDE 400 MG PO TABS: 800.0000 mg | ORAL_TABLET | Freq: Two times a day (BID) | ORAL | Status: DC

## 2012-05-12 MED ORDER — CARVEDILOL 6.25 MG PO TABS: 3.1250 mg | ORAL_TABLET | Freq: Two times a day (BID) | ORAL | Status: DC

## 2012-05-12 MED ORDER — ENALAPRIL MALEATE 5 MG PO TABS: 10.0000 mg | ORAL_TABLET | Freq: Two times a day (BID) | ORAL | Status: DC

## 2012-05-12 NOTE — Procedures (Signed)
Successful placement of Rt UE DL PICC to basilic vein. 38cm length, tip at lower SVC/RA No complications Ready for use.  Brayton El PA-C 05/12/2012 3:23 PM

## 2012-05-12 NOTE — Progress Notes (Signed)
  Echocardiogram 2D Echocardiogram has been performed.  Cathie Beams 05/12/2012, 2:18 PM

## 2012-05-12 NOTE — Assessment & Plan Note (Addendum)
I have reviewed echo personally. Discussed with Mr. Christopherson and his wife at length. 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 He is much improved on milrinone. Unfortunately he will not be a candidate for VAD or transplant due to severe cognitive impairments on neuro-cognitive testing.  Volume status stable despite weight gain. Increase Enalapril to 10 mg bid. Reviewed most recent CMET. Magnesium 1.4 Increase Magnesium 800 mg bid. Reinforced need for daily weights and reviewed use of sliding scale diuretics.   Follow up in 6 weeks.

## 2012-05-12 NOTE — Telephone Encounter (Signed)
Received call from answering service this AM that pt's PICC line had came out over the weekend.  Called and spoke w/pt's wife she states PICC came out on Sat night, she brought him to ER but was told there was not anyone here to reinsert PICC and was told to call us today to f/u, pt is scheduled to have an echo today at 1 pm and see [place] afterwards at 2, wife states it will take them about an hour to get here.  PICC scheduled for today at 2 pm, pt's wife is aware, they will come at 1 and have echo and then go to radiology for PICC, and will see [place] afterwards

## 2012-05-12 NOTE — Patient Instructions (Addendum)
Follow up in 6 weeks  Take enalapril 10 mg twice a day  Take Mag OX 800 mg twice a day.   Do the following things EVERYDAY: 1) Weigh yourself in the morning before breakfast. Write it down and keep it in a log. 2) Take your medicines as prescribed 3) Eat low salt foods-Limit salt (sodium) to 2000 mg per day.  4) Stay as active as you can everyday 5) Limit all fluids for the day to less than 2 liters

## 2012-05-13 ENCOUNTER — Encounter (HOSPITAL_COMMUNITY): Payer: Self-pay

## 2012-05-13 ENCOUNTER — Telehealth (HOSPITAL_COMMUNITY): Payer: Self-pay | Admitting: *Deleted

## 2012-05-13 NOTE — Telephone Encounter (Signed)
Radiology post procedure phone call.  Message left on identified answering machine, encouraged to call for any problems or questions.

## 2012-05-15 ENCOUNTER — Encounter: Payer: Self-pay | Admitting: *Deleted

## 2012-05-15 ENCOUNTER — Encounter: Payer: Self-pay | Admitting: Internal Medicine

## 2012-05-15 ENCOUNTER — Ambulatory Visit (INDEPENDENT_AMBULATORY_CARE_PROVIDER_SITE_OTHER): Payer: Medicare Other | Admitting: Internal Medicine

## 2012-05-15 VITALS — BP 84/52 | HR 70 | Ht 70.0 in | Wt 177.0 lb

## 2012-05-15 DIAGNOSIS — R570 Cardiogenic shock: Secondary | ICD-10-CM

## 2012-05-15 DIAGNOSIS — I4891 Unspecified atrial fibrillation: Secondary | ICD-10-CM

## 2012-05-15 DIAGNOSIS — Z95810 Presence of automatic (implantable) cardiac defibrillator: Secondary | ICD-10-CM

## 2012-05-15 DIAGNOSIS — I5022 Chronic systolic (congestive) heart failure: Secondary | ICD-10-CM

## 2012-05-15 LAB — ICD DEVICE OBSERVATION
AL IMPEDENCE ICD: 381 Ohm
HV IMPEDENCE: 46 Ohm
RV LEAD THRESHOLD: 1.1 V
TZON-0003FASTVT: 324.3 ms
VENTRICULAR PACING ICD: 23 pct

## 2012-05-15 NOTE — Assessment & Plan Note (Signed)
He is maintaining sinus rhythm on low-dose amiodarone therapy. He will continue his anticoagulation.

## 2012-05-15 NOTE — Progress Notes (Signed)
HPI [name] returns today for followup. He is a pleasant 71 yo man with a h/o non-ischemic CM, chronic systolic heart failure, now on milrinone, who is s/p ICD implant in the past. He has moved to [place], [place] and returns today for ICD evaluation and management. He has had no recent ICD shocks, no chest pain and on milrinone, his heart failure symptoms are class2.  No Known Allergies   Current Outpatient Prescriptions  Medication Sig Dispense Refill  . amiodarone (PACERONE) 400 MG tablet Take 0.5 tablets (200 mg total) by mouth daily.      Marland Kitchen aspirin EC 81 MG EC tablet Take 1 tablet (81 mg total) by mouth daily.      . carvedilol (COREG) 6.25 MG tablet Take 0.5 tablets (3.125 mg total) by mouth 2 (two) times daily with a meal.  180 tablet  3  . cetirizine (ZYRTEC) 10 MG tablet Take 10 mg by mouth daily.      . digoxin (LANOXIN) 0.125 MG tablet Take 1 tablet (0.125 mg total) by mouth daily.  90 tablet  3  . docusate sodium (COLACE) 100 MG capsule Take 100 mg by mouth daily.       . enalapril (VASOTEC) 5 MG tablet Take 2 tablets (10 mg total) by mouth 2 (two) times daily.  360 tablet  3  . Fe Fum-FA-B Cmp-C-Zn-Mg-Mn-Cu (HEMATINIC PLUS VIT/MINERALS PO) Take 1 tablet by mouth daily.      . furosemide (LASIX) 40 MG tablet Take 1 tablet (40 mg total) by mouth daily. Once daily but if weight is 160 pounds or greater take 1 tablet twice daily.  45 tablet  3  . magnesium oxide (MAG-OX) 400 MG tablet Take 2 tablets (800 mg total) by mouth 2 (two) times daily.  360 tablet  3  . milrinone (PRIMACOR) 200-5 MCG/ML-% infusion Inject 19.5 mcg/min into the vein continuous.  100 mL    . Multiple Vitamins-Minerals (MULTIVITAMIN WITH MINERALS) tablet Take 1 tablet by mouth daily.      . pantoprazole (PROTONIX) 40 MG tablet Take 40 mg by mouth daily.      Marland Kitchen pyridOXINE (VITAMIN B-6) 100 MG tablet Take 100 mg by mouth daily.      Marland Kitchen senna (SENOKOT) 8.6 MG TABS Take 2 tablets by mouth daily.      . sildenafil  (REVATIO) 20 MG tablet Take 1 tablet (20 mg total) by mouth 3 (three) times daily.  90 tablet  12  . thiamine (VITAMIN B-1) 100 MG tablet Take 100 mg by mouth daily.      . ursodiol (ACTIGALL) 300 MG capsule Take 300 mg by mouth 2 (two) times daily.      Marland Kitchen warfarin (COUMADIN) 5 MG tablet Take 7.5 mg by mouth daily.        No current facility-administered medications for this visit.   Facility-Administered Medications Ordered in Other Visits  Medication Dose Route Frequency Provider Last Rate Last Dose  . 0.9 %  sodium chloride infusion    Continuous PRN Lovie Chol, CRNA         Past Medical History  Diagnosis Date  . Dysrhythmia   . ICD (implantable cardiac defibrillator) in place   . GERD (gastroesophageal reflux disease)   . Chronic kidney disease   . Anemia   . Pacemaker     ROS:   All systems reviewed and negative except as noted in the HPI.   Past Surgical History  Procedure Date  . Colon  surgery   . Insert / replace / remove pacemaker   . Cholecystectomy      No family history on file.   History   Social History  . Marital Status: Married    Spouse Name: N/A    Number of Children: N/A  . Years of Education: N/A   Occupational History  . Not on file.   Social History Main Topics  . Smoking status: Never Smoker   . Smokeless tobacco: Never Used  . Alcohol Use: No  . Drug Use: No  . Sexually Active:    Other Topics Concern  . Not on file   Social History Narrative  . No narrative on file     BP 84/52  Pulse 70  Ht 5\' 10"  (1.778 m)  Wt 177 lb (80.287 kg)  BMI 25.40 kg/m2  SpO2 98%  Physical Exam:  Well appearing 71 yo man, NAD HEENT: Unremarkable Neck:  7 cm JVD, no thyromegally Lungs:  Clear with no wheezes, rales, or rhonchi. HEART:  Regular rate rhythm, no murmurs, no rubs, no clicks, soft S3. Abd:  soft, positive bowel sounds, no organomegally, no rebound, no guarding Ext:  2 plus pulses, no edema, no cyanosis, no  clubbing Skin:  No rashes no nodules Neuro:  CN II through XII intact, motor grossly intact  DEVICE  Normal device function.  See PaceArt for details.   Assess/Plan:

## 2012-05-15 NOTE — Assessment & Plan Note (Signed)
His symptoms are class II. He will continue his inotropic support. He will follow up advanced heart failure clinic.

## 2012-05-15 NOTE — Assessment & Plan Note (Signed)
His [employer] ICD is working normally. I do note that he is pacing approximately 20% of the time. We'll have to keep an eye on this and if he paces anymore, reprogramming of the device will be considered. He is to follow up with [place] in several months.

## 2012-05-15 NOTE — Patient Instructions (Signed)
Your physician wants you to follow-up in: 12 months with Dr Ladona Ridgel in [place] You will receive a reminder letter in the mail two months in advance. If you don't receive a letter, please call our office to schedule the follow-up appointment.  Remote monitoring is used to monitor your Pacemaker of ICD from home. This monitoring reduces the number of office visits required to check your device to one time per year. It allows [place] to keep an eye on the functioning of your device to ensure it is working properly. You are scheduled for a device check from home on 08/21/12. You may send your transmission at any time that day. If you have a wireless device, the transmission will be sent automatically. After your physician reviews your transmission, you will receive a postcard with your next transmission date.

## 2012-05-19 ENCOUNTER — Encounter: Payer: Self-pay | Admitting: Internal Medicine

## 2012-05-21 ENCOUNTER — Encounter: Payer: Self-pay | Admitting: Internal Medicine

## 2012-05-24 NOTE — Assessment & Plan Note (Signed)
Rate controlled. Will eventually consider DC-CV once INR therapeutic for > 4 weeks. Continue amio.

## 2012-05-24 NOTE — Progress Notes (Signed)
Patient ID: Gregg [name], male   DOB: July 28, 1940, 71 y.o.   MRN: [id number] HPI  Gregg [name] is a 71 y.o. 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 He was admitted to [facility] for heart failure symptoms (abdominal pain, nausea, sluggishness, edema). He was transferred to [facility] for treatment of advanced HF with low output physiology on 02/14/12.   LHC (02/14/12) demonstrated normal coronaries. Hemodynamics were Ao Pressure: 79/68 (55), LV Pressure: 82/18 (26), Pulmonary artery sats 27% and 28%, RA 21, PA 59/45 (52), PCWP 33, and Fick 2.1/1.1. IABP was placed in addition to milrinone 0.25 mcg/kg/min support for cardiogenic shock. IABP was weaned and milrinone continued. He did well and was able to be discharged home on home milrinone. Revatio was also started for increased PA pressures and PVR. Discharge weight in 8/13 was 156 pounds.   Has h/o AF which he has been in since January 2013. We did TEE while in hospital (02/21/12) but no DC-CV was attempted to LAA clot.  On 9/18 underwent neurocognitive testing to assess his candidacy for advanced therapies.  02/21/2012 ECHO EF  10-15% 05/12/12 ECHO EF 15% central Mitral Valve regurgitation 05/08/12 BUN 23 Creatinine 1.27 Potassium 4.1 Magnesium 1.4 05/12/12 RUE PICC placed  He returns for follow up. Continues on milrinone. Denies SOB/PND/Orthopnea/dizziness. No shocks. Feels like his memory is getting worse. Weight at home 171-174 pounds. He has been taking an extra 40 mg Lasix 3-4 days per week for ankle edema. Compliant with medications. Denies lower extremity edema. Good appetite. Drinking < 2 liters per day. Followed by [facility].     ROS: All systems negative except as listed in HPI, PMH and Problem List.  Past Medical History  Diagnosis Date  . Dysrhythmia    . ICD (implantable cardiac defibrillator) in place   . GERD (gastroesophageal reflux disease)   . Chronic kidney disease   . Anemia   . Pacemaker     Current Outpatient Prescriptions  Medication Sig Dispense Refill  . amiodarone (PACERONE) 400 MG tablet Take 0.5 tablets (200 mg total) by mouth daily.      Marland Kitchen aspirin EC 81 MG EC tablet Take 1 tablet (81 mg total) by mouth daily.      . carvedilol (COREG) 6.25 MG tablet Take 0.5 tablets (3.125 mg total) by mouth 2 (two) times daily with a meal.  180 tablet  3  . cetirizine (ZYRTEC) 10 MG tablet Take 10 mg by mouth daily.      . digoxin (LANOXIN) 0.125 MG tablet Take 1 tablet (0.125 mg total) by mouth daily.  90 tablet  3  . docusate sodium (COLACE) 100 MG capsule Take 100 mg by mouth daily.       . enalapril (VASOTEC) 5 MG tablet Take 2 tablets (10 mg total) by mouth 2 (two) times daily.  360 tablet  3  . Fe Fum-FA-B Cmp-C-Zn-Mg-Mn-Cu (HEMATINIC PLUS VIT/MINERALS PO) Take 1 tablet by mouth daily.      . furosemide (LASIX) 40 MG tablet Take 1 tablet (40 mg total) by mouth daily. Once daily but if weight is 160 pounds or greater take 1 tablet twice daily.  45 tablet  3  . magnesium oxide (MAG-OX) 400 MG tablet Take 2 tablets (800 mg total) by mouth 2 (two) times daily.  360 tablet  3  . milrinone (PRIMACOR) 200-5  MCG/ML-% infusion Inject 19.5 mcg/min into the vein continuous.  100 mL    . Multiple Vitamins-Minerals (MULTIVITAMIN WITH MINERALS) tablet Take 1 tablet by mouth daily.      . pantoprazole (PROTONIX) 40 MG tablet Take 40 mg by mouth daily.      Marland Kitchen pyridOXINE (VITAMIN B-6) 100 MG tablet Take 100 mg by mouth daily.      Marland Kitchen senna (SENOKOT) 8.6 MG TABS Take 2 tablets by mouth daily.      . sildenafil (REVATIO) 20 MG tablet Take 1 tablet (20 mg total) by mouth 3 (three) times daily.  90 tablet  12  . thiamine (VITAMIN B-1) 100 MG tablet Take 100 mg by mouth daily.      . ursodiol (ACTIGALL) 300 MG capsule Take 300 mg by mouth 2 (two) times  daily.      Marland Kitchen warfarin (COUMADIN) 5 MG tablet Take 7.5 mg by mouth daily.        No current facility-administered medications for this encounter.   Facility-Administered Medications Ordered in Other Encounters  Medication Dose Route Frequency Provider Last Rate Last Dose  . 0.9 %  sodium chloride infusion    Continuous PRN Lovie Chol, CRNA         PHYSICAL EXAM: Filed Vitals:   05/12/12 1533  BP: 96/60  Pulse: 70   Weight change:  180 (167.4)  General:  Well appearing. No resp difficulty (wife present) HEENT: normal Neck: supple. JVP 5-6. Carotids 2+ bilaterally; no bruits. No lymphadenopathy or thryomegaly appreciated. Cor: PMI laterally displaced. No S3.   Irregular rate & rhythm. No rubs. 2/6 MR Lungs: clear Abdomen: soft, nontender, nondistended. No hepatosplenomegaly. No bruits or masses. Good bowel sounds. Extremities: no cyanosis, clubbing, rash, edema RUE PICC in place Neuro: alert & orientedx3, cranial nerves grossly intact. Moves all 4 extremities w/o difficulty. Affect pleasant.      ASSESSMENT & PLAN:

## 2012-05-26 ENCOUNTER — Telehealth (HOSPITAL_COMMUNITY): Payer: Self-pay | Admitting: Adult Health

## 2012-05-26 NOTE — Telephone Encounter (Signed)
Left a message to return call to clinic regarding how much lasix Gregg [name] is taking.   (Creatinine 1.27>1.67 )

## 2012-05-28 DIAGNOSIS — I5023 Acute on chronic systolic (congestive) heart failure: Secondary | ICD-10-CM

## 2012-05-28 NOTE — Telephone Encounter (Signed)
Mrs. Daughdrill called back with dose of Lasix 40 mg daily, and he will take another one if his swelling goes up.

## 2012-06-02 ENCOUNTER — Ambulatory Visit: Payer: Self-pay | Admitting: Internal Medicine

## 2012-06-19 ENCOUNTER — Encounter: Payer: Self-pay | Admitting: Internal Medicine

## 2012-06-24 ENCOUNTER — Encounter (HOSPITAL_COMMUNITY): Payer: Self-pay

## 2012-06-24 ENCOUNTER — Ambulatory Visit (HOSPITAL_COMMUNITY)
Admission: RE | Admit: 2012-06-24 | Discharge: 2012-06-24 | Disposition: A | Discharge status: Home / Self Care | Payer: Medicare Other | Source: Ambulatory Visit | Attending: Internal Medicine | Admitting: Internal Medicine

## 2012-06-24 VITALS — BP 98/60 | HR 80 | Wt 184.0 lb

## 2012-06-24 DIAGNOSIS — I5022 Chronic systolic (congestive) heart failure: Secondary | ICD-10-CM

## 2012-06-24 MED ORDER — ENALAPRIL MALEATE 10 MG PO TABS: 10.0000 mg | ORAL_TABLET | Freq: Two times a day (BID) | ORAL | Status: DC

## 2012-06-24 MED ORDER — FUROSEMIDE 40 MG PO TABS: 40.0000 mg | ORAL_TABLET | Freq: Two times a day (BID) | ORAL | Status: DC

## 2012-06-24 NOTE — Assessment & Plan Note (Addendum)
Functional status mch improved with milrinone. Weight up 24 pounds over the last 3-4 months but volume status only mildly elevated. (appetite has improved greatly on inotropes) Will increase lasix to 40 mg twice a day. Reinforced daily weights, limiting fluid intake to less than 2 liters per day, and low salt food choices. Follow next week to check volume status and labs. Not candidate for advanced therapies due to results of neurocognitive testing.

## 2012-06-24 NOTE — Patient Instructions (Addendum)
Take Lasix 40 mg in am and 40 mg in pm  Follow up next week  Do the following things EVERYDAY: 1) Weigh yourself in the morning before breakfast. Write it down and keep it in a log. 2) Take your medicines as prescribed 3) Eat low salt foods-Limit salt (sodium) to 2000 mg per day.  4) Stay as active as you can everyday 5) Limit all fluids for the day to less than 2 liters

## 2012-06-26 ENCOUNTER — Encounter (HOSPITAL_COMMUNITY): Payer: Self-pay

## 2012-07-02 ENCOUNTER — Encounter (HOSPITAL_COMMUNITY): Payer: Self-pay

## 2012-07-02 ENCOUNTER — Ambulatory Visit (HOSPITAL_COMMUNITY)
Admission: RE | Admit: 2012-07-02 | Discharge: 2012-07-02 | Disposition: A | Discharge status: Home / Self Care | Payer: Medicare Other | Source: Ambulatory Visit | Attending: Internal Medicine | Admitting: Internal Medicine

## 2012-07-02 VITALS — BP 98/58 | HR 78 | Wt 177.1 lb

## 2012-07-02 DIAGNOSIS — I5022 Chronic systolic (congestive) heart failure: Secondary | ICD-10-CM | POA: Insufficient documentation

## 2012-07-02 MED ORDER — BENZONATATE 100 MG PO CAPS: 100.0000 mg | ORAL_CAPSULE | Freq: Two times a day (BID) | ORAL | Status: DC | PRN

## 2012-07-02 NOTE — Patient Instructions (Addendum)
Can pick up tessalon at pharmacy.  Follow up 1 month.

## 2012-07-02 NOTE — Progress Notes (Signed)
Patient ID: Gregg [name]., male   DOB: [birth year], 71 y.o.   MRN: [id number] HPI  Gregg [name] is a 71 y.o. 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 He was admitted to [facility] for heart failure symptoms (abdominal pain, nausea, sluggishness, edema). He was transferred to [facility] for treatment of advanced HF with low output physiology on 02/14/12.   LHC (02/14/12) demonstrated normal coronaries. Hemodynamics were Ao Pressure: 79/68 (55), LV Pressure: 82/18 (26), Pulmonary artery sats 27% and 28%, RA 21, PA 59/45 (52), PCWP 33, and Fick 2.1/1.1. IABP was placed in addition to milrinone 0.25 mcg/kg/min support for cardiogenic shock. IABP was weaned and milrinone continued. He did well and was able to be discharged home on home milrinone. Revatio was also started for increased PA pressures and PVR. Discharge weight in 8/13 was 156 pounds.   Has h/o AF which he has been in since January 2013. We did TEE while in hospital (02/21/12) but no DC-CV was attempted to LAA clot.  On 9/18 underwent neurocognitive testing to assess his candidacy for advanced therapies. Not a candidate due to results for neurocognitive results.   02/21/2012 ECHO EF  10-15% 05/12/12 ECHO EF 15% central Mitral Valve regurgitation 05/08/12 BUN 23 Creatinine 1.27 Potassium 4.1 Magnesium 1.4 05/12/12 RUE PICC placed  He returns for follow up with his wife.  He feels ok.  Last visit lasix increased 40 mg BID.  He has a cough with some production, drainage and a stuffy nose.  His PCP gave him a nasal spray yesterday.  He denies cough during the day.  His weight is stable.  He denies edema.  He has not had to take an extra lasix.  He is compliant with meds.  [name] follows PICC, no redness/fever/chills.  His wife does complain of twitching at night.       ROS: All systems negative except as listed in HPI, PMH and Problem List.  Past Medical History  Diagnosis Date  . Dysrhythmia   . ICD (implantable cardiac defibrillator) in place   . GERD (gastroesophageal reflux disease)   . Chronic kidney disease   . Anemia   . Pacemaker     Current Outpatient Prescriptions  Medication Sig Dispense Refill  . amiodarone (PACERONE) 400 MG tablet Take 0.5 tablets (200 mg total) by mouth daily.      Marland Kitchen aspirin EC 81 MG EC tablet Take 1 tablet (81 mg total) by mouth daily.      . carvedilol (COREG) 6.25 MG tablet Take 0.5 tablets (3.125 mg total) by mouth 2 (two) times daily with a meal.  180 tablet  3  . cetirizine (ZYRTEC) 10 MG tablet Take 10 mg by mouth daily.      . digoxin (LANOXIN) 0.125 MG tablet Take 1 tablet (0.125 mg total) by mouth daily.  90 tablet  3  . docusate sodium (COLACE) 100 MG capsule Take 100 mg by mouth daily.       . enalapril (VASOTEC) 10 MG tablet Take 1 tablet (10 mg total) by mouth 2 (two) times daily.  60 tablet  3  . Fe Fum-FA-B Cmp-C-Zn-Mg-Mn-Cu (HEMATINIC PLUS VIT/MINERALS PO) Take 1 tablet by mouth daily.      . furosemide (LASIX) 40 MG tablet Take 1 tablet (40 mg total) by mouth 2 (two) times daily.  60 tablet  3  .  magnesium oxide (MAG-OX) 400 MG tablet Take 2 tablets (800 mg total) by mouth 2 (two) times daily.  360 tablet  3  . milrinone (PRIMACOR) 200-5 MCG/ML-% infusion Inject 19.5 mcg/min into the vein continuous.  100 mL    . Multiple Vitamins-Minerals (MULTIVITAMIN WITH MINERALS) tablet Take 1 tablet by mouth daily.      . pantoprazole (PROTONIX) 40 MG tablet Take 40 mg by mouth daily.      Marland Kitchen pyridOXINE (VITAMIN B-6) 100 MG tablet Take 100 mg by mouth daily.      Marland Kitchen senna (SENOKOT) 8.6 MG TABS Take 2 tablets by mouth daily.      . sildenafil (REVATIO) 20 MG tablet Take 1 tablet (20 mg total) by mouth 3 (three) times daily.  90 tablet  12  . thiamine (VITAMIN B-1) 100 MG tablet Take 100 mg by mouth daily.       . ursodiol (ACTIGALL) 300 MG capsule Take 300 mg by mouth 2 (two) times daily.      Marland Kitchen warfarin (COUMADIN) 5 MG tablet Take 7.5 mg by mouth daily.        No current facility-administered medications for this encounter.   Facility-Administered Medications Ordered in Other Encounters  Medication Dose Route Frequency Provider Last Rate Last Dose  . 0.9 %  sodium chloride infusion    Continuous PRN Lovie Chol, CRNA         PHYSICAL EXAM: Filed Vitals:   07/02/12 1111  BP: 98/58  Pulse: 78  Weight: 177 lb 1.9 oz (80.341 kg)  SpO2: 98%     General:  Well appearing. No resp difficulty (wife present) HEENT: normal Neck: supple. JVP 9-10. Carotids 2+ bilaterally; no bruits. No lymphadenopathy or thryomegaly appreciated. Cor: PMI laterally displaced. No S3.   Irregular rate & rhythm. No rubs. 2/6 MR Lungs: clear Abdomen: soft, nontender,  Mildly distended. No hepatosplenomegaly. No bruits or masses. Good bowel sounds. Extremities: no cyanosis, clubbing, rash, edema RUE PICC in place Neuro: alert & orientedx3, cranial nerves grossly intact. Moves all 4 extremities w/o difficulty. Affect pleasant.      ASSESSMENT & PLAN:

## 2012-07-04 NOTE — Assessment & Plan Note (Signed)
Being replaced.  Follow up with Magnesium level.

## 2012-07-04 NOTE — Assessment & Plan Note (Addendum)
Gregg [name] is on home inotropes.  He is not a candidate for VAD or transplant due to severe cognitive impairments on neuro-cognitive testing.  Will continue milrinone.  Weight stable.  Will continue lasix 40 mg BID and follow renal function closely.  May try to increase carvedilol at follow up in 1 month.

## 2012-07-06 NOTE — Progress Notes (Signed)
Patient ID: Gregg [name]., male   DOB: 02-10-1941, 71 y.o.   MRN: [id number] HPI  [name] is a 71 y.o. 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 He was admitted to [facility] for heart failure symptoms (abdominal pain, nausea, sluggishness, edema). He was transferred to [facility] for treatment of advanced HF with low output physiology on 02/14/12.   LHC (02/14/12) demonstrated normal coronaries. Hemodynamics were Ao Pressure: 79/68 (55), LV Pressure: 82/18 (26), Pulmonary artery sats 27% and 28%, RA 21, PA 59/45 (52), PCWP 33, and Fick 2.1/1.1. IABP was placed in addition to milrinone 0.25 mcg/kg/min support for cardiogenic shock. IABP was weaned and milrinone continued. He did well and was able to be discharged home on home milrinone. Revatio was also started for increased PA pressures and PVR. Discharge weight in 8/13 was 156 pounds.   Has h/o AF which he has been in since January 2013. We did TEE while in hospital (02/21/12) but no DC-CV was attempted to LAA clot.  On 9/18 underwent neurocognitive testing to assess his candidacy for advanced therapies. Not a candidate due to results for neurocognitive results.   02/21/2012 ECHO EF  10-15% 05/12/12 ECHO EF 15% central Mitral Valve regurgitation 05/08/12 BUN 23 Creatinine 1.27 Potassium 4.1 Magnesium 1.4 05/12/12 RUE PICC placed  He returns for follow up with his wife. Last visit enalapril increased to 10 mg bid and magnesium increased to 800 mg twice a day. Overall he feels better.  Denies SOB/PND/Orthopnea.  Sleeps on 2 pillows at night. Over the last couple of days he has been coughing a lot at night. Weight at home 174-177 pounds. He is taking an extra 40 mg of lasix about 3 days a week. Good appetite. Drinking < 2 liters per day. Followed by [facility] [address].  Complaint with medications.     ROS: All systems negative except as listed in HPI, PMH and Problem List.  Past Medical History  Diagnosis Date  . Dysrhythmia   . ICD (implantable cardiac defibrillator) in place   . GERD (gastroesophageal reflux disease)   . Chronic kidney disease   . Anemia   . Pacemaker     Current Outpatient Prescriptions  Medication Sig Dispense Refill  . amiodarone (PACERONE) 400 MG tablet Take 0.5 tablets (200 mg total) by mouth daily.      Marland Kitchen aspirin EC 81 MG EC tablet Take 1 tablet (81 mg total) by mouth daily.      . carvedilol (COREG) 6.25 MG tablet Take 0.5 tablets (3.125 mg total) by mouth 2 (two) times daily with a meal.  180 tablet  3  . cetirizine (ZYRTEC) 10 MG tablet Take 10 mg by mouth daily.      . digoxin (LANOXIN) 0.125 MG tablet Take 1 tablet (0.125 mg total) by mouth daily.  90 tablet  3  . docusate sodium (COLACE) 100 MG capsule Take 100 mg by mouth daily.       . enalapril (VASOTEC) 10 MG tablet Take 1 tablet (10 mg total) by mouth 2 (two) times daily.  60 tablet  3  . Fe Fum-FA-B Cmp-C-Zn-Mg-Mn-Cu (HEMATINIC PLUS VIT/MINERALS PO) Take 1 tablet by mouth daily.      . furosemide (LASIX) 40 MG tablet Take 1 tablet (40 mg total) by mouth 2 (two) times daily.  60 tablet  3  . magnesium  oxide (MAG-OX) 400 MG tablet Take 2 tablets (800 mg total) by mouth 2 (two) times daily.  360 tablet  3  . milrinone (PRIMACOR) 200-5 MCG/ML-% infusion Inject 19.5 mcg/min into the vein continuous.  100 mL    . Multiple Vitamins-Minerals (MULTIVITAMIN WITH MINERALS) tablet Take 1 tablet by mouth daily.      . pantoprazole (PROTONIX) 40 MG tablet Take 40 mg by mouth daily.      Marland Kitchen pyridOXINE (VITAMIN B-6) 100 MG tablet Take 100 mg by mouth daily.      Marland Kitchen senna (SENOKOT) 8.6 MG TABS Take 2 tablets by mouth daily.      . sildenafil (REVATIO) 20 MG tablet Take 1 tablet (20 mg total) by mouth 3 (three) times daily.  90 tablet  12  . thiamine (VITAMIN B-1) 100 MG tablet  Take 100 mg by mouth daily.      . ursodiol (ACTIGALL) 300 MG capsule Take 300 mg by mouth 2 (two) times daily.      Marland Kitchen warfarin (COUMADIN) 5 MG tablet Take 7.5 mg by mouth daily.       . benzonatate (TESSALON PERLES) 100 MG capsule Take 1 capsule (100 mg total) by mouth 2 (two) times daily as needed for cough.  20 capsule  0   No current facility-administered medications for this encounter.   Facility-Administered Medications Ordered in Other Encounters  Medication Dose Route Frequency Provider Last Rate Last Dose  . 0.9 %  sodium chloride infusion    Continuous PRN Lovie Chol, CRNA         PHYSICAL EXAM: Filed Vitals:   06/24/12 1230  BP: 98/60  Pulse: 80   Weight change:  180>184  General:  Well appearing. No resp difficulty (wife present) HEENT: normal Neck: supple. JVP 9-10. Carotids 2+ bilaterally; no bruits. No lymphadenopathy or thryomegaly appreciated. Cor: PMI laterally displaced. No S3.   Irregular rate & rhythm. No rubs. 2/6 MR Lungs: clear Abdomen: soft, nontender,  Mildly distended. No hepatosplenomegaly. No bruits or masses. Good bowel sounds. Extremities: no cyanosis, clubbing, rash, edema RUE PICC in place Neuro: alert & orientedx3, cranial nerves grossly intact. Moves all 4 extremities w/o difficulty. Affect pleasant.      ASSESSMENT & PLAN:

## 2012-07-10 DIAGNOSIS — I5023 Acute on chronic systolic (congestive) heart failure: Secondary | ICD-10-CM

## 2012-07-17 ENCOUNTER — Encounter: Payer: Self-pay | Admitting: Internal Medicine

## 2012-07-22 ENCOUNTER — Telehealth (HOSPITAL_COMMUNITY): Payer: Self-pay | Admitting: Adult Health

## 2012-07-22 ENCOUNTER — Other Ambulatory Visit (HOSPITAL_COMMUNITY): Payer: Self-pay | Admitting: Physician Assistant

## 2012-07-22 MED ORDER — MAGNESIUM OXIDE 400 MG PO TABS: ORAL_TABLET | ORAL | Status: DC

## 2012-07-22 NOTE — Telephone Encounter (Signed)
Left message to return call to HF clinic.   Magnesium 1.5 Instructed to take Mag Ox 800 mg in am and 1200 mg in pm.  Instructed to Mag Ox with food to increase absorption.   Home Health to check Magnesium next week.   Maylea Soria NP-C 2:49 PM

## 2012-07-22 NOTE — Telephone Encounter (Signed)
Pt and pts wife aware. Voiced understanding

## 2012-07-29 ENCOUNTER — Telehealth (HOSPITAL_COMMUNITY): Payer: Self-pay | Admitting: Cardiology

## 2012-07-29 NOTE — Telephone Encounter (Signed)
Mr and Mrs. Sine called with concerns. Pt states he feels something is in his chest while coughing. While sneezing he feels something rolling in his chest. He feels better when he sits up rather than lying down. Threw up something white on 1/9, hasn't been feeling well. Mild SOB when breathing.  Please advise

## 2012-07-29 NOTE — Telephone Encounter (Signed)
Spoke w/pt he reports having a cough for the past month that doesn't seem to be getting any better and now he has a funny feeling in his chest when he coughs and moves his arm, advised could be a pulled muscle, he report wt sat was 173 and yesterday it was 178, he forgot to wt today, he denies edema or abd being tight but does feel increase in SOB, he will take extra Lasix tonight and tomorrow and let me know if wt not coming down, discussed importance of daily wts, he is sch to see [place] on Mon 1/20

## 2012-08-04 ENCOUNTER — Ambulatory Visit (HOSPITAL_COMMUNITY)
Admission: RE | Admit: 2012-08-04 | Discharge: 2012-08-04 | Disposition: A | Discharge status: Home / Self Care | Payer: Medicare Other | Source: Ambulatory Visit | Attending: Internal Medicine | Admitting: Internal Medicine

## 2012-08-04 ENCOUNTER — Encounter (HOSPITAL_COMMUNITY): Payer: Self-pay

## 2012-08-04 VITALS — BP 84/52 | HR 70 | Wt 178.1 lb

## 2012-08-04 DIAGNOSIS — I5022 Chronic systolic (congestive) heart failure: Secondary | ICD-10-CM | POA: Insufficient documentation

## 2012-08-04 NOTE — Patient Instructions (Addendum)
Diet Class on Tuesday February 4th at 2pm.  Follow up 1 month.  May take extra lasix tablet for weight 177 pounds or more  Do the following things EVERYDAY: 1) Weigh yourself in the morning before breakfast. Write it down and keep it in a log. 2) Take your medicines as prescribed 3) Eat low salt foods-Limit salt (sodium) to 2000 mg per day.  4) Stay as active as you can everyday 5) Limit all fluids for the day to less than 2 liters

## 2012-08-04 NOTE — Progress Notes (Signed)
HPI  [name] is a 72 y.o. 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 He was admitted to [facility] for heart failure symptoms (abdominal pain, nausea, sluggishness, edema). He was transferred to [facility] for treatment of advanced HF with low output physiology on 02/14/12.   LHC (02/14/12) demonstrated normal coronaries. Hemodynamics were Ao Pressure: 79/68 (55), LV Pressure: 82/18 (26), Pulmonary artery sats 27% and 28%, RA 21, PA 59/45 (52), PCWP 33, and Fick 2.1/1.1. IABP was placed in addition to milrinone 0.25 mcg/kg/min support for cardiogenic shock. IABP was weaned and milrinone continued. He did well and was able to be discharged home on home milrinone. Revatio was also started for increased PA pressures and PVR. Discharge weight in 8/13 was 156 pounds.   Has h/o AF which he has been in since January 2013. We did TEE while in hospital (02/21/12) but no DC-CV was attempted to LAA clot.  On 9/18 underwent neurocognitive testing to assess his candidacy for advanced therapies. Not a candidate due to results for neurocognitive results.   02/21/2012 ECHO EF  10-15% 05/12/12 ECHO EF 15% central Mitral Valve regurgitation 05/08/12 BUN 23 Creatinine 1.27 Potassium 4.1 Magnesium 1.4 05/12/12 RUE PICC placed  He returns for follow up with his wife.  He is feeling better.  Last week he called with cough and weight increase from 173 to 178 pounds.  He was instructed to double his lasix for the day.  His weight improved and now is running 173-175 pounds.  He denies dizziness/syncope.  No edema, orthopnea, PND.  No problems with PICC, followed by [facility].     ROS: All systems negative except as listed in HPI, PMH and Problem List.  Past Medical History  Diagnosis Date  . Dysrhythmia   . ICD (implantable cardiac  defibrillator) in place   . GERD (gastroesophageal reflux disease)   . Chronic kidney disease   . Anemia   . Pacemaker     Current Outpatient Prescriptions  Medication Sig Dispense Refill  . amiodarone (PACERONE) 400 MG tablet Take 0.5 tablets (200 mg total) by mouth daily.      Marland Kitchen aspirin EC 81 MG EC tablet Take 1 tablet (81 mg total) by mouth daily.      . benzonatate (TESSALON PERLES) 100 MG capsule Take 1 capsule (100 mg total) by mouth 2 (two) times daily as needed for cough.  20 capsule  0  . carvedilol (COREG) 6.25 MG tablet Take 0.5 tablets (3.125 mg total) by mouth 2 (two) times daily with a meal.  180 tablet  3  . cetirizine (ZYRTEC) 10 MG tablet Take 10 mg by mouth daily.      . digoxin (LANOXIN) 0.125 MG tablet Take 1 tablet (0.125 mg total) by mouth daily.  90 tablet  3  . docusate sodium (COLACE) 100 MG capsule Take 100 mg by mouth daily.       . enalapril (VASOTEC) 10 MG tablet Take 1 tablet (10 mg total) by mouth 2 (two) times daily.  60 tablet  3  . Fe Fum-FA-B Cmp-C-Zn-Mg-Mn-Cu (HEMATINIC PLUS VIT/MINERALS PO) Take 1 tablet by mouth daily.      . furosemide (LASIX) 40 MG tablet Take 1 tablet (40 mg total) by mouth 2 (two) times daily.  60 tablet  3  . magnesium oxide (MAG-OX) 400 MG tablet Take 800 mg  in am and 1200 mg in pm  360 tablet  3  . milrinone (PRIMACOR) 200-5 MCG/ML-% infusion Inject 19.5 mcg/min into the vein continuous.  100 mL    . Multiple Vitamins-Minerals (MULTIVITAMIN WITH MINERALS) tablet Take 1 tablet by mouth daily.      . pantoprazole (PROTONIX) 40 MG tablet Take 40 mg by mouth daily.      Marland Kitchen pyridOXINE (VITAMIN B-6) 100 MG tablet Take 100 mg by mouth daily.      Marland Kitchen senna (SENOKOT) 8.6 MG TABS Take 2 tablets by mouth daily.      . sildenafil (REVATIO) 20 MG tablet Take 1 tablet (20 mg total) by mouth 3 (three) times daily.  90 tablet  12  . thiamine (VITAMIN B-1) 100 MG tablet Take 100 mg by mouth daily.      . ursodiol (ACTIGALL) 300 MG capsule Take 300  mg by mouth 2 (two) times daily.      Marland Kitchen warfarin (COUMADIN) 5 MG tablet Take 7.5 mg by mouth daily.        No current facility-administered medications for this encounter.   Facility-Administered Medications Ordered in Other Encounters  Medication Dose Route Frequency Provider Last Rate Last Dose  . 0.9 %  sodium chloride infusion    Continuous PRN Lovie Chol, CRNA         PHYSICAL EXAM: Filed Vitals:   08/04/12 1211  BP: 84/52  Pulse: 70  Weight: 178 lb 1.9 oz (80.795 kg)  SpO2: 94%     General:  Well appearing. No resp difficulty  HEENT: normal Neck: supple. JVP 6-7. Carotids 2+ bilaterally; no bruits. No lymphadenopathy or thryomegaly appreciated. Cor: PMI laterally displaced. No S3.   Irregular rate & rhythm. No rubs. 2/6 MR Lungs: clear Abdomen: soft, nontender,  Mildly distended. No hepatosplenomegaly. No bruits or masses. Good bowel sounds. Extremities: no cyanosis, clubbing, rash, edema RUE PICC in place Neuro: alert & orientedx3, cranial nerves grossly intact. Moves all 4 extremities w/o difficulty. Affect pleasant.      ASSESSMENT & PLAN:

## 2012-08-06 ENCOUNTER — Encounter: Payer: Self-pay | Admitting: Internal Medicine

## 2012-08-06 NOTE — Assessment & Plan Note (Addendum)
Volume status improved after doubling lasix for 1 day.  Have re-educated on low sodium diet and fluid restrictions.  The patient and his wife will attend the HF diet class at the beginning of February.  Have also discussed sliding scale lasix for weight 177 pounds or more.  Will continue with home inotropes as he is not a candidate for advanced therapies due to results of neurocogn. testing.   Continue PICC care through [facility].

## 2012-08-07 ENCOUNTER — Encounter: Payer: Self-pay | Admitting: Internal Medicine

## 2012-08-11 ENCOUNTER — Other Ambulatory Visit (HOSPITAL_COMMUNITY): Payer: Self-pay | Admitting: *Deleted

## 2012-08-11 MED ORDER — ENALAPRIL MALEATE 10 MG PO TABS: 10.0000 mg | ORAL_TABLET | Freq: Two times a day (BID) | ORAL | Status: DC

## 2012-08-21 ENCOUNTER — Ambulatory Visit (INDEPENDENT_AMBULATORY_CARE_PROVIDER_SITE_OTHER): Payer: Medicare Other | Admitting: *Deleted

## 2012-08-21 ENCOUNTER — Encounter: Payer: Self-pay | Admitting: Internal Medicine

## 2012-08-21 DIAGNOSIS — Z95810 Presence of automatic (implantable) cardiac defibrillator: Secondary | ICD-10-CM

## 2012-08-21 DIAGNOSIS — I428 Other cardiomyopathies: Secondary | ICD-10-CM

## 2012-08-27 ENCOUNTER — Encounter: Payer: Self-pay | Admitting: Internal Medicine

## 2012-08-29 LAB — REMOTE ICD DEVICE
DEV-0020ICD: NEGATIVE
DEVICE MODEL ICD: 157921
FVT: 0
HV IMPEDENCE: 43 Ohm
PACEART VT: 0
RV LEAD AMPLITUDE: 14.5 mv
TZAT-0001FASTVT: 1
TZAT-0013FASTVT: 4
TZAT-0018FASTVT: NEGATIVE
TZST-0001FASTVT: 3
TZST-0001FASTVT: 4
TZST-0003FASTVT: 41 J
TZST-0003FASTVT: 41 J
VENTRICULAR PACING ICD: 76 pct
VF: 0

## 2012-09-02 ENCOUNTER — Ambulatory Visit (HOSPITAL_COMMUNITY)
Admission: RE | Admit: 2012-09-02 | Discharge: 2012-09-02 | Disposition: A | Discharge status: Home / Self Care | Payer: Medicare Other | Source: Ambulatory Visit | Attending: Internal Medicine | Admitting: Internal Medicine

## 2012-09-02 ENCOUNTER — Encounter (HOSPITAL_COMMUNITY): Payer: Self-pay | Admitting: *Deleted

## 2012-09-02 ENCOUNTER — Encounter: Payer: Self-pay | Admitting: Internal Medicine

## 2012-09-02 ENCOUNTER — Encounter (HOSPITAL_COMMUNITY): Payer: Self-pay

## 2012-09-02 VITALS — BP 92/64 | HR 70 | Wt 180.8 lb

## 2012-09-02 DIAGNOSIS — I5022 Chronic systolic (congestive) heart failure: Secondary | ICD-10-CM | POA: Insufficient documentation

## 2012-09-02 DIAGNOSIS — I4891 Unspecified atrial fibrillation: Secondary | ICD-10-CM | POA: Insufficient documentation

## 2012-09-02 MED ORDER — AMIODARONE HCL 400 MG PO TABS: 200.0000 mg | ORAL_TABLET | Freq: Two times a day (BID) | ORAL | Status: DC

## 2012-09-02 NOTE — Assessment & Plan Note (Signed)
NYHA IIIB. Volume status mildly elevated. Instructed to take additional 40 mg Lasix if his weight is 175 pounds or greater. Continue current Milrinone via PICC. Continue home health for PICC maintenance. I have encouraged his wife to assist him with his medications due to memory problems. Follow up in 1 month.

## 2012-09-02 NOTE — Addendum Note (Signed)
Encounter addended by: Hadassah Pais, PA on: 09/02/2012  3:06 PM<BR>     Documentation filed: Orders

## 2012-09-02 NOTE — Patient Instructions (Addendum)
Follow up in 2 weeks   Take extra 40 mg Lasix if your weight is 175 pounds or greater.   Take Amiodarone 200 mg twice a day.   Do the following things EVERYDAY: 1) Weigh yourself in the morning before breakfast. Write it down and keep it in a log. 2) Take your medicines as prescribed 3) Eat low salt foods-Limit salt (sodium) to 2000 mg per day.  4) Stay as active as you can everyday 5) Limit all fluids for the day to less than 2 liters

## 2012-09-02 NOTE — Assessment & Plan Note (Addendum)
Per EP he is now in afib 100% of the time and V pacing 76% with recommendations to increase Amiodarone to 200 mg twice a day and request to turn on rate control. Discussed with Dr Gala Romney and he agrees with increasing amiodarone to 200 mg twice a day. I have asked his wife to call HF clinic back to verify Amiodarone dose at home. PT/INR managed by PCP. INR 2-3. Plan for TEE/DCCV in 2 weeks to attempt to convert.

## 2012-09-02 NOTE — Progress Notes (Signed)
Patient ID: Kweli Grassel., male   DOB: March 31, 1941, 72 y.o.   MRN: [id number] HPI  Mr. Wery is a 72 y.o. 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   LHC (02/14/12) demonstrated normal coronaries. Hemodynamics were Ao Pressure: 79/68 (55), LV Pressure: 82/18 (26), Pulmonary artery sats 27% and 28%, RA 21, PA 59/45 (52), PCWP 33, and Fick 2.1/1.1. IABP was placed in addition to milrinone 0.25 mcg/kg/min support for cardiogenic shock. IABP was weaned and milrinone continued. He did well and was able to be discharged home on home milrinone. Revatio was also started for increased PA pressures and PVR. Discharge weight in 8/13 was 156 pounds.   Has h/o AF which he has been in since January 2013. We did TEE while in hospital (02/21/12) but no DC-CV was attempted due to LAA clot.  On 04/02/12 underwent neurocognitive testing to assess his candidacy for advanced therapies. Not a candidate for advanced therapies due to results for neurocognitive results.   02/21/2012 ECHO EF  10-15% 05/12/12 ECHO EF 15% central Mitral Valve regurgitation 05/08/12 BUN 23 Creatinine 1.27 Potassium 4.1 Magnesium 1.4 05/12/12 RUE PICC placed  He returns for follow up with his wife.  He continues on milrinone via PICC. Denies SOB/PND/Orthopnea. Does complain of fatigue.No bleeding problems. Weight at home 171-173 pounds. Complaint with medications. He has home PT/INR machine at home which is followed by his PCP. His wife says INR has ben 2.0-3.0.  Followed by [facility] for home Milrinone. He and his wife are not sure about he dose of Amiodarone. He has sought at second opinion with Dr Adriana Simas in [place].   Per EP he is now in afib 100% of the time and V pacing 76% with recommendations to increase Amiodarone to 200 mg twice a day and request to turn on  rate control.   ROS: All systems negative except as listed in HPI, PMH and Problem List.  Past Medical History  Diagnosis Date  . Dysrhythmia   . ICD (implantable cardiac defibrillator) in place   . GERD (gastroesophageal reflux disease)   . Chronic kidney disease   . Anemia   . Pacemaker     Current Outpatient Prescriptions  Medication Sig Dispense Refill  . amiodarone (PACERONE) 400 MG tablet Take 0.5 tablets (200 mg total) by mouth daily.      Marland Kitchen aspirin EC 81 MG EC tablet Take 1 tablet (81 mg total) by mouth daily.      . benzonatate (TESSALON PERLES) 100 MG capsule Take 1 capsule (100 mg total) by mouth 2 (two) times daily as needed for cough.  20 capsule  0  . carvedilol (COREG) 6.25 MG tablet Take 0.5 tablets (3.125 mg total) by mouth 2 (two) times daily with a meal.  180 tablet  3  . cetirizine (ZYRTEC) 10 MG tablet Take 10 mg by mouth daily.      . digoxin (LANOXIN) 0.125 MG tablet Take 1 tablet (0.125 mg total) by mouth daily.  90 tablet  3  . docusate sodium (COLACE) 100 MG capsule Take 100 mg by mouth daily.       . enalapril (VASOTEC) 10 MG tablet Take 1 tablet (10 mg total) by mouth 2 (two) times daily.  180 tablet  3  . Fe Fum-FA-B Cmp-C-Zn-Mg-Mn-Cu (HEMATINIC PLUS VIT/MINERALS PO) Take 1 tablet by mouth daily.      Marland Kitchen  furosemide (LASIX) 40 MG tablet Take 1 tablet (40 mg total) by mouth 2 (two) times daily.  60 tablet  3  . magnesium oxide (MAG-OX) 400 MG tablet Take 800 mg in am and 1200 mg in pm  360 tablet  3  . milrinone (PRIMACOR) 200-5 MCG/ML-% infusion Inject 19.5 mcg/min into the vein continuous.  100 mL    . Multiple Vitamins-Minerals (MULTIVITAMIN WITH MINERALS) tablet Take 1 tablet by mouth daily.      . pantoprazole (PROTONIX) 40 MG tablet Take 40 mg by mouth daily.      Marland Kitchen pyridOXINE (VITAMIN B-6) 100 MG tablet Take 100 mg by mouth daily.      Marland Kitchen senna (SENOKOT) 8.6 MG TABS Take 2 tablets by mouth daily.      . sildenafil (REVATIO) 20 MG tablet Take 1 tablet  (20 mg total) by mouth 3 (three) times daily.  90 tablet  12  . thiamine (VITAMIN B-1) 100 MG tablet Take 100 mg by mouth daily.      . ursodiol (ACTIGALL) 300 MG capsule Take 300 mg by mouth 2 (two) times daily.      Marland Kitchen warfarin (COUMADIN) 5 MG tablet Take 7.5 mg by mouth daily.        No current facility-administered medications for this encounter.   Facility-Administered Medications Ordered in Other Encounters  Medication Dose Route Frequency Provider Last Rate Last Dose  . 0.9 %  sodium chloride infusion    Continuous PRN Lovie Chol, CRNA         PHYSICAL EXAM: Filed Vitals:   09/02/12 1337  BP: 92/64  Pulse: 70  Weight: 180 lb 12.8 oz (82.01 kg)  SpO2: 98%     General:  Well appearing. No resp difficulty  HEENT: normal Neck: supple. JVP 6-7. Carotids 2+ bilaterally; no bruits. No lymphadenopathy or thryomegaly appreciated. Cor: PMI laterally displaced. No S3.   Irregular rate & rhythm. No rubs. 2/6 MR Lungs: clear Abdomen: soft, nontender,  Mildly distended. No hepatosplenomegaly. No bruits or masses. Good bowel sounds. Extremities: no cyanosis, clubbing, rash, edema RUE PICC in place Neuro: alert & orientedx3, cranial nerves grossly intact. Moves all 4 extremities w/o difficulty. Affect pleasant.      ASSESSMENT & PLAN:

## 2012-09-02 NOTE — Addendum Note (Signed)
Encounter addended by: Hadassah Pais, PA on: 09/02/2012  3:04 PM<BR>     Documentation filed: Orders, PRL Based Order Sets

## 2012-09-11 ENCOUNTER — Encounter (HOSPITAL_COMMUNITY): Payer: Self-pay

## 2012-09-16 ENCOUNTER — Encounter (HOSPITAL_COMMUNITY): Payer: Self-pay | Admitting: Certified Registered"

## 2012-09-16 ENCOUNTER — Ambulatory Visit (HOSPITAL_COMMUNITY): Payer: Medicare Other | Admitting: Certified Registered"

## 2012-09-16 ENCOUNTER — Encounter (HOSPITAL_COMMUNITY): Payer: Self-pay | Admitting: *Deleted

## 2012-09-16 ENCOUNTER — Encounter (HOSPITAL_COMMUNITY)
Admission: RE | Disposition: A | Discharge status: Home / Self Care | Payer: Self-pay | Source: Ambulatory Visit | Attending: Internal Medicine

## 2012-09-16 ENCOUNTER — Ambulatory Visit (HOSPITAL_COMMUNITY)
Admission: RE | Admit: 2012-09-16 | Discharge: 2012-09-16 | Disposition: A | Discharge status: Home / Self Care | Payer: Medicare Other | Source: Ambulatory Visit | Attending: Internal Medicine | Admitting: Internal Medicine

## 2012-09-16 DIAGNOSIS — I428 Other cardiomyopathies: Secondary | ICD-10-CM | POA: Insufficient documentation

## 2012-09-16 DIAGNOSIS — I4891 Unspecified atrial fibrillation: Secondary | ICD-10-CM | POA: Insufficient documentation

## 2012-09-16 DIAGNOSIS — Z95810 Presence of automatic (implantable) cardiac defibrillator: Secondary | ICD-10-CM | POA: Insufficient documentation

## 2012-09-16 DIAGNOSIS — I509 Heart failure, unspecified: Secondary | ICD-10-CM | POA: Insufficient documentation

## 2012-09-16 DIAGNOSIS — K219 Gastro-esophageal reflux disease without esophagitis: Secondary | ICD-10-CM | POA: Insufficient documentation

## 2012-09-16 DIAGNOSIS — N189 Chronic kidney disease, unspecified: Secondary | ICD-10-CM | POA: Insufficient documentation

## 2012-09-16 DIAGNOSIS — Z7901 Long term (current) use of anticoagulants: Secondary | ICD-10-CM | POA: Insufficient documentation

## 2012-09-16 HISTORY — PX: TEE WITHOUT CARDIOVERSION: SHX5443

## 2012-09-16 HISTORY — DX: Heart failure, unspecified: I50.9

## 2012-09-16 HISTORY — DX: Sleep apnea, unspecified: G47.30

## 2012-09-16 HISTORY — PX: CARDIOVERSION: SHX1299

## 2012-09-16 LAB — BASIC METABOLIC PANEL
CO2: 27 mEq/L (ref 19–32)
Chloride: 97 mEq/L (ref 96–112)
GFR calc Af Amer: 53 mL/min — ABNORMAL LOW (ref >90)
Potassium: 3.9 mEq/L (ref 3.5–5.1)
Sodium: 137 mEq/L (ref 135–145)

## 2012-09-16 LAB — CBC
HCT: 43.9 % (ref 39.0–52.0)
Hemoglobin: 15.1 g/dL (ref 13.0–17.0)
MCV: 92.6 fL (ref 78.0–100.0)
RBC: 4.74 MIL/uL (ref 4.22–5.81)
RDW: 15.2 % (ref 11.5–15.5)
WBC: 5.7 10*3/uL (ref 4.0–10.5)

## 2012-09-16 LAB — PROTIME-INR: INR: 2 — ABNORMAL HIGH (ref 0.00–1.49)

## 2012-09-16 SURGERY — ECHOCARDIOGRAM, TRANSESOPHAGEAL
Anesthesia: Moderate Sedation

## 2012-09-16 MED ORDER — MIDAZOLAM HCL 10 MG/2ML IJ SOLN
INTRAMUSCULAR | Status: DC | PRN
  Administered 2012-09-16 (×2): 2 mg via INTRAVENOUS

## 2012-09-16 MED ORDER — FENTANYL CITRATE 0.05 MG/ML IJ SOLN
INTRAMUSCULAR | Status: AC
  Filled 2012-09-16: qty 2

## 2012-09-16 MED ORDER — FENTANYL CITRATE 0.05 MG/ML IJ SOLN
INTRAMUSCULAR | Status: DC | PRN
  Administered 2012-09-16: 25 ug via INTRAVENOUS

## 2012-09-16 MED ORDER — DIPHENHYDRAMINE HCL 50 MG/ML IJ SOLN
INTRAMUSCULAR | Status: AC
  Filled 2012-09-16: qty 1

## 2012-09-16 MED ORDER — MIDAZOLAM HCL 5 MG/ML IJ SOLN
INTRAMUSCULAR | Status: AC
  Filled 2012-09-16: qty 2

## 2012-09-16 MED ORDER — LIDOCAINE VISCOUS 2 % MT SOLN
OROMUCOSAL | Status: AC
  Filled 2012-09-16: qty 15

## 2012-09-16 MED ORDER — LIDOCAINE VISCOUS 2 % MT SOLN
OROMUCOSAL | Status: DC | PRN
  Administered 2012-09-16: 20 mL via OROMUCOSAL

## 2012-09-16 MED ORDER — SODIUM CHLORIDE 0.9 % IV SOLN
INTRAVENOUS | Status: DC
  Administered 2012-09-16: 500 mL via INTRAVENOUS

## 2012-09-16 NOTE — Anesthesia Preprocedure Evaluation (Addendum)
Anesthesia Evaluation  Patient identified by MRN, date of birth, ID band Patient awake    Reviewed: Allergy & Precautions, H&P , NPO status , Patient's Chart, lab work & pertinent test results, reviewed documented beta blocker date and time   Airway Mallampati: II TM Distance: >3 FB Neck ROM: Full    Dental  (+) Dental Advisory Given   Pulmonary sleep apnea ,          Cardiovascular +CHF + dysrhythmias + pacemaker + Cardiac Defibrillator     Neuro/Psych    GI/Hepatic GERD-  Medicated and Controlled,  Endo/Other    Renal/GU      Musculoskeletal   Abdominal   Peds  Hematology   Anesthesia Other Findings   Reproductive/Obstetrics                           Anesthesia Physical Anesthesia Plan  ASA: IV  Anesthesia Plan: General   Post-op Pain Management:    Induction:   Airway Management Planned: Mask  Additional Equipment:   Intra-op Plan:   Post-operative Plan:   Informed Consent:   Plan Discussed with:   Anesthesia Plan Comments:        Anesthesia Quick Evaluation

## 2012-09-16 NOTE — Progress Notes (Signed)
Echocardiogram Echocardiogram Transesophageal has been performed.  [name], Gregg 09/16/2012, 3:10 PM

## 2012-09-16 NOTE — Interval H&P Note (Signed)
History and Physical Interval Note:  09/16/2012 2:17 PM  Gregg [name]  has presented today for surgery, with the diagnosis of a-fib  The various methods of treatment have been discussed with the patient and family. After consideration of risks, benefits and other options for treatment, the patient has consented to  Procedure(s): TRANSESOPHAGEAL ECHOCARDIOGRAM (TEE) (N/A) CARDIOVERSION (N/A) as a surgical intervention .  [name] history has been reviewed, patient examined, no change in status, stable for surgery.  I have reviewed the patient's chart and labs.  Questions were answered to the patient's satisfaction.     Daniel Bensimhon

## 2012-09-16 NOTE — Transfer of Care (Signed)
Immediate Anesthesia Transfer of Care Note  Patient: Gregg [name]  Procedure(s) Performed: Procedure(s): TRANSESOPHAGEAL ECHOCARDIOGRAM (TEE) (N/A) CARDIOVERSION (N/A)  Patient Location: PACU  Anesthesia Type:MAC  Level of Consciousness: sedated  Airway & Oxygen Therapy: Patient Spontanous Breathing and Patient connected to nasal cannula oxygen  Post-op Assessment: Report given to PACU RN and Post -op Vital signs reviewed and stable  Post vital signs: Reviewed and stable  Complications: No apparent anesthesia complications

## 2012-09-16 NOTE — H&P (View-Only) (Signed)
Patient ID: Gregg Kommer Jr., male   DOB: 11/18/1940, 72 y.o.   MRN: [id number] HPI  [name] is a 72 y.o. 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   LHC (02/14/12) demonstrated normal coronaries. Hemodynamics were Ao Pressure: 79/68 (55), LV Pressure: 82/18 (26), Pulmonary artery sats 27% and 28%, RA 21, PA 59/45 (52), PCWP 33, and Fick 2.1/1.1. IABP was placed in addition to milrinone 0.25 mcg/kg/min support for cardiogenic shock. IABP was weaned and milrinone continued. He did well and was able to be discharged home on home milrinone. Revatio was also started for increased PA pressures and PVR. Discharge weight in 8/13 was 156 pounds.   Has h/o AF which he has been in since January 2013. We did TEE while in hospital (02/21/12) but no DC-CV was attempted due to LAA clot.  On 04/02/12 underwent neurocognitive testing to assess his candidacy for advanced therapies. Not a candidate for advanced therapies due to results for neurocognitive results.   02/21/2012 ECHO EF  10-15% 05/12/12 ECHO EF 15% central Mitral Valve regurgitation 05/08/12 BUN 23 Creatinine 1.27 Potassium 4.1 Magnesium 1.4 05/12/12 RUE PICC placed  He returns for follow up with his wife.  He continues on milrinone via PICC. Denies SOB/PND/Orthopnea. Does complain of fatigue.No bleeding problems. Weight at home 171-173 pounds. Complaint with medications. He has home PT/INR machine at home which is followed by his PCP. His wife says INR has ben 2.0-3.0.  Followed by [name] for home Milrinone. He and his wife are not sure about he dose of Amiodarone. He has sought at second opinion with Dr Cook in [place].   Per EP he is now in afib 100% of the time and V pacing 76% with recommendations to increase Amiodarone to 200 mg twice a day and request to turn on  rate control.   ROS: All systems negative except as listed in HPI, PMH and Problem List.  Past Medical History  Diagnosis Date  . Dysrhythmia   . ICD (implantable cardiac defibrillator) in place   . GERD (gastroesophageal reflux disease)   . Chronic kidney disease   . Anemia   . Pacemaker     Current Outpatient Prescriptions  Medication Sig Dispense Refill  . amiodarone (PACERONE) 400 MG tablet Take 0.5 tablets (200 mg total) by mouth daily.      . aspirin EC 81 MG EC tablet Take 1 tablet (81 mg total) by mouth daily.      . benzonatate (TESSALON PERLES) 100 MG capsule Take 1 capsule (100 mg total) by mouth 2 (two) times daily as needed for cough.  20 capsule  0  . carvedilol (COREG) 6.25 MG tablet Take 0.5 tablets (3.125 mg total) by mouth 2 (two) times daily with a meal.  180 tablet  3  . cetirizine (ZYRTEC) 10 MG tablet Take 10 mg by mouth daily.      . digoxin (LANOXIN) 0.125 MG tablet Take 1 tablet (0.125 mg total) by mouth daily.  90 tablet  3  . docusate sodium (COLACE) 100 MG capsule Take 100 mg by mouth daily.       . enalapril (VASOTEC) 10 MG tablet Take 1 tablet (10 mg total) by mouth 2 (two) times daily.  180 tablet  3  . Fe Fum-FA-B Cmp-C-Zn-Mg-Mn-Cu (HEMATINIC PLUS VIT/MINERALS PO) Take 1 tablet by mouth daily.      .   furosemide (LASIX) 40 MG tablet Take 1 tablet (40 mg total) by mouth 2 (two) times daily.  60 tablet  3  . magnesium oxide (MAG-OX) 400 MG tablet Take 800 mg in am and 1200 mg in pm  360 tablet  3  . milrinone (PRIMACOR) 200-5 MCG/ML-% infusion Inject 19.5 mcg/min into the vein continuous.  100 mL    . Multiple Vitamins-Minerals (MULTIVITAMIN WITH MINERALS) tablet Take 1 tablet by mouth daily.      . pantoprazole (PROTONIX) 40 MG tablet Take 40 mg by mouth daily.      . pyridOXINE (VITAMIN B-6) 100 MG tablet Take 100 mg by mouth daily.      . senna (SENOKOT) 8.6 MG TABS Take 2 tablets by mouth daily.      . sildenafil (REVATIO) 20 MG tablet Take 1 tablet  (20 mg total) by mouth 3 (three) times daily.  90 tablet  12  . thiamine (VITAMIN B-1) 100 MG tablet Take 100 mg by mouth daily.      . ursodiol (ACTIGALL) 300 MG capsule Take 300 mg by mouth 2 (two) times daily.      . warfarin (COUMADIN) 5 MG tablet Take 7.5 mg by mouth daily.        No current facility-administered medications for this encounter.   Facility-Administered Medications Ordered in Other Encounters  Medication Dose Route Frequency Provider Last Rate Last Dose  . 0.9 %  sodium chloride infusion    Continuous PRN Jennifer K Rock, CRNA         PHYSICAL EXAM: Filed Vitals:   09/02/12 1337  BP: 92/64  Pulse: 70  Weight: 180 lb 12.8 oz (82.01 kg)  SpO2: 98%     General:  Well appearing. No resp difficulty  HEENT: normal Neck: supple. JVP 6-7. Carotids 2+ bilaterally; no bruits. No lymphadenopathy or thryomegaly appreciated. Cor: PMI laterally displaced. No S3.   Irregular rate & rhythm. No rubs. 2/6 MR Lungs: clear Abdomen: soft, nontender,  Mildly distended. No hepatosplenomegaly. No bruits or masses. Good bowel sounds. Extremities: no cyanosis, clubbing, rash, edema RUE PICC in place Neuro: alert & orientedx3, cranial nerves grossly intact. Moves all 4 extremities w/o difficulty. Affect pleasant.      ASSESSMENT & PLAN:  
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
## 2012-09-16 NOTE — Preoperative (Signed)
Beta Blockers   Reason not to administer Beta Blockers:Not Applicable 

## 2012-09-16 NOTE — Anesthesia Postprocedure Evaluation (Signed)
  Anesthesia Post-op Note  Patient: Gregg [name]  Procedure(s) Performed: Procedure(s): TRANSESOPHAGEAL ECHOCARDIOGRAM (TEE) (N/A) CARDIOVERSION (N/A)  Patient Location: PACU and Short Stay  Anesthesia Type:MAC  Level of Consciousness: sedated  Airway and Oxygen Therapy: Patient Spontanous Breathing  Post-op Pain: none  Post-op Assessment: Post-op Vital signs reviewed and Patient's Cardiovascular Status Stable  Post-op Vital Signs: Reviewed and stable  Complications: No apparent anesthesia complications

## 2012-09-17 ENCOUNTER — Encounter (HOSPITAL_COMMUNITY): Payer: Self-pay | Admitting: Internal Medicine

## 2012-09-17 ENCOUNTER — Encounter: Payer: Self-pay | Admitting: *Deleted

## 2012-09-30 ENCOUNTER — Ambulatory Visit (HOSPITAL_COMMUNITY)
Admission: RE | Admit: 2012-09-30 | Discharge: 2012-09-30 | Disposition: A | Discharge status: Home / Self Care | Payer: Medicare Other | Source: Ambulatory Visit | Attending: Internal Medicine | Admitting: Internal Medicine

## 2012-09-30 VITALS — BP 98/52 | HR 53 | Wt 179.5 lb

## 2012-09-30 DIAGNOSIS — I4891 Unspecified atrial fibrillation: Secondary | ICD-10-CM

## 2012-09-30 DIAGNOSIS — I5022 Chronic systolic (congestive) heart failure: Secondary | ICD-10-CM

## 2012-09-30 MED ORDER — CARVEDILOL 6.25 MG PO TABS: 3.1250 mg | ORAL_TABLET | Freq: Every day | ORAL | Status: DC

## 2012-09-30 NOTE — Patient Instructions (Addendum)
Take carvedilol 3.125 mg daily  Do the following things EVERYDAY: 1) Weigh yourself in the morning before breakfast. Write it down and keep it in a log. 2) Take your medicines as prescribed 3) Eat low salt foods-Limit salt (sodium) to 2000 mg per day.  4) Stay as active as you can everyday 5) Limit all fluids for the day to less than 2 liters  Follow up in 2 months in Dr Gala Romney

## 2012-09-30 NOTE — Assessment & Plan Note (Addendum)
S/P successful DC-CV 09/16/12. [employer] interrogated. In SR 100% of the time. Continue amiodarone. Will need to continue coumadin for 30 days post DC-CV. He needs PICC removed and hickman placed as soon as possible for indefinite inotropes. Will discuss with his PCP, Dr Allene Dillon to coordinate lovenox bridge. He will need to be off coumadin 3 days prior to Hickman placed.

## 2012-09-30 NOTE — Progress Notes (Signed)
Patient ID: Gregg [name], male   DOB: 1941-04-30, 72 y.o.   MRN: [id number] PCP (manages PT/INR): Dr Allene Dillon-  HPI  Gregg [name] is a 72 y.o. 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   LHC (02/14/12) demonstrated normal coronaries. Hemodynamics were Ao Pressure: 79/68 (55), LV Pressure: 82/18 (26), Pulmonary artery sats 27% and 28%, RA 21, PA 59/45 (52), PCWP 33, and Fick 2.1/1.1. IABP was placed in addition to milrinone 0.25 mcg/kg/min support for cardiogenic shock. IABP was weaned and milrinone continued. He did well and was able to be discharged home on home milrinone. Revatio was also started for increased PA pressures and PVR. Discharge weight in 8/13 was 156 pounds.   Has h/o AF which he has been in since January 2013. We did TEE while in hospital (02/21/12) but no DC-CV was attempted due to LAA clot.  On 04/02/12 underwent neurocognitive testing to assess his candidacy for advanced therapies. Not a candidate for advanced therapies due to results for neurocognitive results.   02/21/2012 ECHO EF  10-15% 05/12/12 ECHO EF 15% central Mitral Valve regurgitation 05/12/12 RUE PICC placed  09/16/12 S/P TEE and successful DC-CV EF 10-15%  He returns for follow up with his wife.  Last visit Amiodarone increased to 200 mg twice a day due to A fib 100% of the time. As noted above S/P successful cardioversion.  He complains of skin irritation around PICC and admits to scratching. Also his wife reports leakage when unused port is flushed. He continues on milrinone via PICC.Denies fevers/body aches/chills.  Denies SOB/PND/Orthopnea/dizziness. Does complain of fatigue.No bleeding problems. Weight at home 171-173 pounds. He takes an extra lasix if his weight is 175 or greater.  Able to walk 1 mile at a time a couple days a week. Complaint with  medications. He has home PT/INR machine at home which is followed by his PCP. His wife says INR has ben 2.0-3.0.  Followed by [facility] for home Milrinone.  He has sought at second opinion with Dr Adriana Simas in [place].    ROS: All systems negative except as listed in HPI, PMH and Problem List.  Past Medical History  Diagnosis Date  . Dysrhythmia   . ICD (implantable cardiac defibrillator) in place   . GERD (gastroesophageal reflux disease)   . Chronic kidney disease   . Anemia   . Pacemaker   . CHF (congestive heart failure)   . Sleep apnea     uses CPAP    Current Outpatient Prescriptions  Medication Sig Dispense Refill  . amiodarone (PACERONE) 400 MG tablet Take 0.5 tablets (200 mg total) by mouth 2 (two) times daily.  60 tablet  3  . aspirin EC 81 MG EC tablet Take 1 tablet (81 mg total) by mouth daily.      . carvedilol (COREG) 6.25 MG tablet Take 0.5 tablets (3.125 mg total) by mouth 2 (two) times daily with a meal.  180 tablet  3  . cetirizine (ZYRTEC) 10 MG tablet Take 10 mg by mouth daily.      . digoxin (LANOXIN) 0.125 MG tablet Take 1 tablet (0.125 mg total) by mouth daily.  90 tablet  3  . docusate sodium (COLACE) 100 MG capsule Take 100 mg by mouth daily as needed for constipation.       . enalapril (VASOTEC) 10 MG tablet Take 1  tablet (10 mg total) by mouth 2 (two) times daily.  180 tablet  3  . Fe Fum-FA-B Cmp-C-Zn-Mg-Mn-Cu (FERROCITE PLUS PO) Take 1 tablet by mouth daily.      . furosemide (LASIX) 40 MG tablet Take 1 tablet (40 mg total) by mouth 2 (two) times daily.  60 tablet  3  . furosemide (LASIX) 40 MG tablet Take 40 mg by mouth daily as needed (edema, weight gain). Takes an additional 40mg  daily as needed for excessive edema/weight gain      . hydrocortisone cream 1 % Apply 1 application topically 2 (two) times daily. Apply to arms for tape irritation      . magnesium oxide (MAG-OX) 400 MG tablet Take 800 mg in am and 1200 mg in pm  360 tablet  3  . milrinone  (PRIMACOR) 200-5 MCG/ML-% infusion Inject 19.5 mcg/min into the vein continuous.  100 mL    . Multiple Vitamins-Minerals (MULTIVITAMIN WITH MINERALS) tablet Take 1 tablet by mouth daily.      . pantoprazole (PROTONIX) 40 MG tablet Take 40 mg by mouth daily.      Marland Kitchen pyridOXINE (VITAMIN B-6) 100 MG tablet Take 100 mg by mouth daily.      Marland Kitchen senna (SENOKOT) 8.6 MG TABS Take 1 tablet by mouth daily.       . sildenafil (REVATIO) 20 MG tablet Take 1 tablet (20 mg total) by mouth 3 (three) times daily.  90 tablet  12  . thiamine (VITAMIN B-1) 100 MG tablet Take 100 mg by mouth daily.      . ursodiol (ACTIGALL) 300 MG capsule Take 300 mg by mouth 2 (two) times daily.      Marland Kitchen warfarin (COUMADIN) 5 MG tablet Take 5-7.5 mg by mouth daily. 5mg  every day except 7.5mg  Tuesdays and Fridays       No current facility-administered medications for this encounter.   Facility-Administered Medications Ordered in Other Encounters  Medication Dose Route Frequency Provider Last Rate Last Dose  . 0.9 %  sodium chloride infusion    Continuous PRN Lovie Chol, CRNA         PHYSICAL EXAM: Filed Vitals:   09/30/12 1347  BP: 98/52  Pulse: 53  Weight: 179 lb 8 oz (81.421 kg)  SpO2: 95%     General:  Well appearing. No resp difficulty Wife present HEENT: normal Neck: supple. JVP 5-6. Carotids 2+ bilaterally; no bruits. No lymphadenopathy or thryomegaly appreciated. Cor: PMI laterally displaced. No S3.   Irregular rate & rhythm. No rubs. 2/6 MR Lungs: clear Abdomen: soft, nontender,  Mildly distended. No hepatosplenomegaly. No bruits or masses. Good bowel sounds. Extremities: no cyanosis, clubbing, rash, edema RUE PICC in place. Excoriation noted under PICC dressing  Neuro: alert & orientedx3, cranial nerves grossly intact. Moves all 4 extremities w/o difficulty. Affect pleasant.      ASSESSMENT & PLAN:

## 2012-09-30 NOTE — Assessment & Plan Note (Addendum)
NYHA III on continuous Milrinone at 0.375 mcg. Volume status stable. Most recent lab work stable. Cut back carvedilol to 3.125 mg daily as his HR is 50. [facility] representative assessed RUE PICC site followed by IV Team at [facility]. Due to leakage noted from unused  port and excoriation around insertion site they recommended removal of PICC and placement of hickman catheter because of indefinite inotrope therapy. Gregg [name] requested  follow up in 2 months. Continue HHRN for PICC dressing changes and Milrinone. >Greater than 2 hours spent in the appointment due multiple issues related to inoptropes and PICC.

## 2012-10-01 ENCOUNTER — Telehealth (HOSPITAL_COMMUNITY): Payer: Self-pay | Admitting: Interventional Radiology

## 2012-10-01 ENCOUNTER — Encounter (HOSPITAL_COMMUNITY): Payer: Self-pay

## 2012-10-01 ENCOUNTER — Other Ambulatory Visit (HOSPITAL_COMMUNITY): Payer: Self-pay | Admitting: Interventional Radiology

## 2012-10-01 DIAGNOSIS — I639 Cerebral infarction, unspecified: Secondary | ICD-10-CM

## 2012-10-01 NOTE — Addendum Note (Signed)
Encounter addended by: Noralee Space, RN on: 10/01/2012  2:10 PM<BR>     Documentation filed: Orders

## 2012-10-03 ENCOUNTER — Other Ambulatory Visit: Payer: Self-pay | Admitting: Radiology

## 2012-10-03 ENCOUNTER — Encounter (HOSPITAL_COMMUNITY): Payer: Self-pay | Admitting: Pharmacy Technician

## 2012-10-07 ENCOUNTER — Encounter (HOSPITAL_COMMUNITY): Payer: Self-pay

## 2012-10-07 ENCOUNTER — Other Ambulatory Visit (HOSPITAL_COMMUNITY): Payer: Self-pay | Admitting: Internal Medicine

## 2012-10-07 ENCOUNTER — Ambulatory Visit (HOSPITAL_COMMUNITY)
Admission: RE | Admit: 2012-10-07 | Discharge: 2012-10-07 | Disposition: A | Discharge status: Home / Self Care | Payer: Medicare Other | Source: Ambulatory Visit | Attending: Internal Medicine | Admitting: Internal Medicine

## 2012-10-07 DIAGNOSIS — I4891 Unspecified atrial fibrillation: Secondary | ICD-10-CM | POA: Insufficient documentation

## 2012-10-07 DIAGNOSIS — G4730 Sleep apnea, unspecified: Secondary | ICD-10-CM | POA: Insufficient documentation

## 2012-10-07 DIAGNOSIS — I5022 Chronic systolic (congestive) heart failure: Secondary | ICD-10-CM

## 2012-10-07 DIAGNOSIS — D649 Anemia, unspecified: Secondary | ICD-10-CM | POA: Insufficient documentation

## 2012-10-07 DIAGNOSIS — N189 Chronic kidney disease, unspecified: Secondary | ICD-10-CM | POA: Insufficient documentation

## 2012-10-07 DIAGNOSIS — Z95810 Presence of automatic (implantable) cardiac defibrillator: Secondary | ICD-10-CM | POA: Insufficient documentation

## 2012-10-07 DIAGNOSIS — K219 Gastro-esophageal reflux disease without esophagitis: Secondary | ICD-10-CM | POA: Insufficient documentation

## 2012-10-07 DIAGNOSIS — Z452 Encounter for adjustment and management of vascular access device: Secondary | ICD-10-CM | POA: Insufficient documentation

## 2012-10-07 LAB — PROTIME-INR
INR: 1.16 (ref 0.00–1.49)
INR: 1.22 (ref 0.00–1.49)
Prothrombin Time: 14.6 seconds (ref 11.6–15.2)
Prothrombin Time: 15.2 seconds (ref 11.6–15.2)

## 2012-10-07 LAB — CBC WITH DIFFERENTIAL/PLATELET
Basophils Absolute: 0.1 10*3/uL (ref 0.0–0.1)
Eosinophils Relative: 7 % — ABNORMAL HIGH (ref 0–5)
Lymphocytes Relative: 33 % (ref 12–46)
MCV: 91.5 fL (ref 78.0–100.0)
Neutrophils Relative %: 39 % — ABNORMAL LOW (ref 43–77)
Platelets: 235 10*3/uL (ref 150–400)
RDW: 14.5 % (ref 11.5–15.5)
WBC: 6.9 10*3/uL (ref 4.0–10.5)

## 2012-10-07 LAB — SURGICAL PCR SCREEN
MRSA, PCR: NEGATIVE
Staphylococcus aureus: NEGATIVE

## 2012-10-07 LAB — APTT: aPTT: 32 seconds (ref 24–37)

## 2012-10-07 MED ORDER — SODIUM CHLORIDE 0.9 % IV SOLN: INTRAVENOUS | Status: DC

## 2012-10-07 MED ORDER — MIDAZOLAM HCL 2 MG/2ML IJ SOLN
INTRAMUSCULAR | Status: AC
  Filled 2012-10-07: qty 4

## 2012-10-07 MED ORDER — CEFAZOLIN SODIUM-DEXTROSE 2-3 GM-% IV SOLR
2.0000 g | Freq: Once | INTRAVENOUS | Status: AC
  Administered 2012-10-07: 2 g via INTRAVENOUS
  Filled 2012-10-07: qty 50

## 2012-10-07 MED ORDER — FENTANYL CITRATE 0.05 MG/ML IJ SOLN
INTRAMUSCULAR | Status: AC
  Filled 2012-10-07: qty 4

## 2012-10-07 MED ORDER — MUPIROCIN 2 % EX OINT
TOPICAL_OINTMENT | Freq: Two times a day (BID) | CUTANEOUS | Status: DC
  Filled 2012-10-07: qty 22

## 2012-10-07 MED ORDER — MUPIROCIN 2 % EX OINT
TOPICAL_OINTMENT | CUTANEOUS | Status: AC
  Filled 2012-10-07: qty 22

## 2012-10-07 MED ORDER — MIDAZOLAM HCL 2 MG/2ML IJ SOLN
INTRAMUSCULAR | Status: DC | PRN
  Administered 2012-10-07: 1 mg via INTRAVENOUS

## 2012-10-07 MED ORDER — FENTANYL CITRATE 0.05 MG/ML IJ SOLN
INTRAMUSCULAR | Status: DC | PRN
  Administered 2012-10-07: 25 ug via INTRAVENOUS

## 2012-10-07 NOTE — ED Notes (Signed)
Dr Grace Isaac, in spoke with pt about single lumen Hickman.  Wife concerned about it being single lumen.  Questions answered.  Ready for D/C.  Instructions reviewed with pt and wife.

## 2012-10-07 NOTE — H&P (Signed)
Gregg [name] is an 72 y.o. male.   Chief Complaint: atrial fib; CHF Defibrillator in place amiodarone po  Indefinite inotropic meds needed Scheduled to remove PICC and place tunneled Hickman catheter  HPI: afib; ICD; CKD; pacemaker; CHF  Past Medical History  Diagnosis Date  . Dysrhythmia   . ICD (implantable cardiac defibrillator) in place   . GERD (gastroesophageal reflux disease)   . Chronic kidney disease   . Anemia   . Pacemaker   . CHF (congestive heart failure)   . Sleep apnea     uses CPAP    Past Surgical History  Procedure Laterality Date  . Insert / replace / remove pacemaker    . Colon surgery      ruptured colon  . Tee without cardioversion N/A 09/16/2012    Procedure: TRANSESOPHAGEAL ECHOCARDIOGRAM (TEE);  Surgeon: Dolores Patty, MD;  Location: [facility] ENDOSCOPY;  Service: Cardiovascular;  Laterality: N/A;  . Cardioversion N/A 09/16/2012    Procedure: CARDIOVERSION;  Surgeon: Dolores Patty, MD;  Location: [facility] ENDOSCOPY;  Service: Cardiovascular;  Laterality: N/A;    No family history on file. Social History:  reports that he has quit smoking. He has never used smokeless tobacco. He reports that he does not drink alcohol or use illicit drugs.  Allergies: No Known Allergies   (Not in a hospital admission)  Results for orders placed during the hospital encounter of 10/07/12 (from the past 48 hour(s))  PROTIME-INR     Status: None   Collection Time    10/07/12  8:55 AM      Result Value Range   Prothrombin Time 14.6  11.6 - 15.2 seconds   INR 1.16  0.00 - 1.49   No results found.  Review of Systems  Constitutional: Negative for fever and weight loss.  Respiratory: Negative for cough and shortness of breath.   Cardiovascular: Negative for chest pain.  Gastrointestinal: Negative for nausea, vomiting, abdominal pain and diarrhea.  Neurological: Positive for weakness.    There were no vitals taken for this visit. Physical Exam  Constitutional: He is  oriented to person, place, and time. He appears well-developed.  Cardiovascular: Normal rate, regular rhythm and normal heart sounds.   No murmur heard. Respiratory: Effort normal and breath sounds normal. He has no wheezes.  GI: Soft. Bowel sounds are normal. There is no tenderness.  Musculoskeletal: Normal range of motion.  Neurological: He is alert and oriented to person, place, and time.  Skin: Skin is warm.  Psychiatric: He has a normal mood and affect. His behavior is normal. Judgment and thought content normal.     Assessment/Plan Need for inotropic meds indefinitely Scheduled for PICC removal and tunneled Hickman cath placement Pt aware of procedure benefits and risks and agreeable to proceed Consent signed and in chart  Shann Lewellyn A 10/07/2012, 9:48 AM

## 2012-10-07 NOTE — ED Notes (Signed)
PICC line removed in IR per K. Hines, RT.  Pt tolerated well.

## 2012-10-07 NOTE — Procedures (Signed)
Successful placement of tunneled right IJ approach Hickman catheter with tip terminating within the superior aspect of the right atrium.   The patient tolerated the procedure well without immediate post procedural complication. The catheter is ready for immediate use.

## 2012-10-08 ENCOUNTER — Encounter: Payer: Self-pay | Admitting: Internal Medicine

## 2012-10-10 DIAGNOSIS — Z452 Encounter for adjustment and management of vascular access device: Secondary | ICD-10-CM

## 2012-10-10 DIAGNOSIS — I4891 Unspecified atrial fibrillation: Secondary | ICD-10-CM

## 2012-10-10 DIAGNOSIS — Z79899 Other long term (current) drug therapy: Secondary | ICD-10-CM

## 2012-10-10 DIAGNOSIS — I428 Other cardiomyopathies: Secondary | ICD-10-CM

## 2012-10-16 ENCOUNTER — Other Ambulatory Visit (HOSPITAL_COMMUNITY): Payer: Self-pay | Admitting: *Deleted

## 2012-10-16 MED ORDER — AMIODARONE HCL 400 MG PO TABS: 200.0000 mg | ORAL_TABLET | Freq: Two times a day (BID) | ORAL | Status: DC

## 2012-10-20 ENCOUNTER — Other Ambulatory Visit (HOSPITAL_COMMUNITY): Payer: Self-pay | Admitting: Adult Health

## 2012-10-21 ENCOUNTER — Other Ambulatory Visit (HOSPITAL_COMMUNITY): Payer: Self-pay | Admitting: Adult Health

## 2012-10-30 ENCOUNTER — Encounter: Payer: Self-pay | Admitting: Internal Medicine

## 2012-11-11 ENCOUNTER — Encounter: Payer: Self-pay | Admitting: Internal Medicine

## 2012-11-20 ENCOUNTER — Other Ambulatory Visit: Payer: Self-pay | Admitting: Internal Medicine

## 2012-11-20 ENCOUNTER — Ambulatory Visit (INDEPENDENT_AMBULATORY_CARE_PROVIDER_SITE_OTHER): Payer: Medicare Other | Admitting: *Deleted

## 2012-11-20 DIAGNOSIS — I5022 Chronic systolic (congestive) heart failure: Secondary | ICD-10-CM

## 2012-11-20 DIAGNOSIS — Z95810 Presence of automatic (implantable) cardiac defibrillator: Secondary | ICD-10-CM

## 2012-11-20 LAB — REMOTE ICD DEVICE
AL AMPLITUDE: 5.5 mv
CHARGE TIME: 9.1 s
HV IMPEDENCE: 47 Ohm
RV LEAD AMPLITUDE: 15.2 mv
RV LEAD IMPEDENCE ICD: 454 Ohm
TOT-0006: 20140304000000
TZAT-0001FASTVT: 1
TZAT-0001FASTVT: 2
TZAT-0002FASTVT: NEGATIVE
TZAT-0013FASTVT: 4
TZAT-0018FASTVT: NEGATIVE
TZON-0003FASTVT: 324.3 ms
TZST-0001FASTVT: 4
TZST-0001FASTVT: 5
TZST-0001FASTVT: 8
TZST-0003FASTVT: 17 J
TZST-0003FASTVT: 41 J
TZST-0003FASTVT: 41 J

## 2012-11-24 ENCOUNTER — Ambulatory Visit (HOSPITAL_COMMUNITY)
Admission: RE | Admit: 2012-11-24 | Discharge: 2012-11-24 | Disposition: A | Discharge status: Home / Self Care | Payer: Medicare Other | Source: Ambulatory Visit | Attending: Internal Medicine | Admitting: Internal Medicine

## 2012-11-24 VITALS — BP 90/52 | HR 50 | Wt 179.2 lb

## 2012-11-24 DIAGNOSIS — N189 Chronic kidney disease, unspecified: Secondary | ICD-10-CM | POA: Insufficient documentation

## 2012-11-24 DIAGNOSIS — Z7982 Long term (current) use of aspirin: Secondary | ICD-10-CM | POA: Insufficient documentation

## 2012-11-24 DIAGNOSIS — I2510 Atherosclerotic heart disease of native coronary artery without angina pectoris: Secondary | ICD-10-CM | POA: Insufficient documentation

## 2012-11-24 DIAGNOSIS — G4730 Sleep apnea, unspecified: Secondary | ICD-10-CM | POA: Insufficient documentation

## 2012-11-24 DIAGNOSIS — Z79899 Other long term (current) drug therapy: Secondary | ICD-10-CM | POA: Insufficient documentation

## 2012-11-24 DIAGNOSIS — I509 Heart failure, unspecified: Secondary | ICD-10-CM | POA: Insufficient documentation

## 2012-11-24 DIAGNOSIS — I4891 Unspecified atrial fibrillation: Secondary | ICD-10-CM | POA: Insufficient documentation

## 2012-11-24 DIAGNOSIS — Z7901 Long term (current) use of anticoagulants: Secondary | ICD-10-CM | POA: Insufficient documentation

## 2012-11-24 DIAGNOSIS — I5022 Chronic systolic (congestive) heart failure: Secondary | ICD-10-CM

## 2012-11-24 DIAGNOSIS — K219 Gastro-esophageal reflux disease without esophagitis: Secondary | ICD-10-CM | POA: Insufficient documentation

## 2012-11-24 DIAGNOSIS — Z95810 Presence of automatic (implantable) cardiac defibrillator: Secondary | ICD-10-CM | POA: Insufficient documentation

## 2012-11-24 DIAGNOSIS — I428 Other cardiomyopathies: Secondary | ICD-10-CM | POA: Insufficient documentation

## 2012-11-24 NOTE — Patient Instructions (Addendum)
Follow up in 3 months   Do the following things EVERYDAY: 1) Weigh yourself in the morning before breakfast. Write it down and keep it in a log. 2) Take your medicines as prescribed 3) Eat low salt foods-Limit salt (sodium) to 2000 mg per day.  4) Stay as active as you can everyday 5) Limit all fluids for the day to less than 2 liters  

## 2012-11-24 NOTE — Assessment & Plan Note (Addendum)
He is doing very well with IV milrinone. Volume status looks good. Functional status much improved. He is not VAD or OHTx candidate due to cognitive dysfunction. We discussed the possibility of a BiV upgrade but I told them that it would be unusual to see a significant enough improvement to wean milrinone. Given that he has done so well with milrinone I have recommended continuing IV inotrope therapy.

## 2012-11-24 NOTE — Progress Notes (Signed)
Patient ID: Gregg [name], male   DOB: 1941-03-23, 72 y.o.   MRN: [id number] PCP (manages PT/INR): Dr Allene Dillon-   HPI [name] is a 72 y.o. 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  and CKD baseline 1.37.  He has sought at second opinion with Dr Gregg [name] in [place]. On 04/02/12 underwent neurocognitive testing to assess his candidacy for advanced therapies. Not a candidate for advanced therapies due to results for neurocognitive results.    LHC (02/14/12) demonstrated normal coronaries. Hemodynamics were Ao Pressure: 79/68 (55), LV Pressure: 82/18 (26), Pulmonary artery sats 27% and 28%, RA 21, PA 59/45 (52), PCWP 33, and Fick 2.1/1.1. IABP was placed in addition to milrinone 0.25 mcg/kg/min support for cardiogenic shock. IABP was weaned and milrinone continued. He did well and was able to be discharged home on home milrinone. Revatio was also started for increased PA pressures and PVR. Discharge weight in 8/13 was 156 pounds.    02/21/2012 ECHO EF  10-15% 05/12/12 ECHO EF 15% central Mitral Valve regurgitation 09/2012 S/P Hickman  He returns for follow up with his wife. Last visit carvedilol was cut back to 3.125 mg twice a day due HR 50. Hickman cathter placed in March of this year. Requesting cardiac clearance for cataract surgery. Denies SOB/PND/Orthopnea. Compliant with medications. Weight at home 171-175 pounds. He continues on chronic Milrinone at 0.375 mcg via Hickman.     ROS: All systems negative except as listed in HPI, PMH and Problem List.  Past Medical History  Diagnosis Date  . Dysrhythmia   . ICD (implantable cardiac defibrillator) in place   . GERD (gastroesophageal reflux disease)   . Chronic kidney  disease   . Anemia   . Pacemaker   . CHF (congestive heart failure)   . Sleep apnea     uses CPAP    Current Outpatient Prescriptions  Medication Sig Dispense Refill  . amiodarone (PACERONE) 400 MG tablet Take 0.5 tablets (200 mg total) by mouth 2 (two) times daily.  90 tablet  3  . aspirin EC 81 MG EC tablet Take 1 tablet (81 mg total) by mouth daily.      . carvedilol (COREG) 6.25 MG tablet Take 3.125 mg by mouth every evening.      . cetirizine (ZYRTEC) 10 MG tablet Take 10 mg by mouth daily.      . digoxin (LANOXIN) 0.125 MG tablet Take 1 tablet (0.125 mg total) by mouth daily.  90 tablet  3  . docusate sodium (COLACE) 100 MG capsule Take 100 mg by mouth daily as needed for constipation.       . enalapril (VASOTEC) 10 MG tablet Take 1 tablet (10 mg total) by mouth 2 (two) times daily.  180 tablet  3  . Fe Fum-FA-B Cmp-C-Zn-Mg-Mn-Cu (FERROCITE PLUS PO) Take 1 tablet by mouth daily.      . furosemide (LASIX) 40 MG tablet Take 1 tablet (40 mg total) by mouth 2 (two) times daily. MAY TAKE EXTRA TAB AS NEEDED FOR SWELLING  75 tablet  3  . hydrocortisone cream 1 % Apply 1 application topically 2 (two) times daily as needed (for itching or tape irritation).       . magnesium oxide (MAG-OX) 400 MG tablet  Take 800-1,200 mg by mouth 2 (two) times daily. 2 tablets (800 mg) every morning and 3 tablets (1200 mg) every evening      . milrinone (PRIMACOR) 200-5 MCG/ML-% infusion Inject 19.5 mcg/min into the vein continuous.  100 mL    . Multiple Vitamins-Minerals (MULTIVITAMIN WITH MINERALS) tablet Take 1 tablet by mouth daily.      Marland Kitchen pyridOXINE (VITAMIN B-6) 100 MG tablet Take 100 mg by mouth daily.      Marland Kitchen senna (SENOKOT) 8.6 MG TABS Take 1 tablet by mouth daily.       . sildenafil (REVATIO) 20 MG tablet Take 1 tablet (20 mg total) by mouth 3 (three) times daily.  90 tablet  12  . thiamine (VITAMIN B-1) 100 MG tablet Take 100 mg by mouth daily.      . ursodiol (ACTIGALL) 300 MG capsule Take 300 mg  by mouth 2 (two) times daily.      Marland Kitchen warfarin (COUMADIN) 5 MG tablet Take 5-7.5 mg by mouth daily. 5mg  every day except 7.5mg  Tuesdays and Fridays      . pantoprazole (PROTONIX) 40 MG tablet Take 40 mg by mouth daily.       No current facility-administered medications for this encounter.   Facility-Administered Medications Ordered in Other Encounters  Medication Dose Route Frequency Provider Last Rate Last Dose  . 0.9 %  sodium chloride infusion    Continuous PRN Lovie Chol, CRNA         PHYSICAL EXAM: Filed Vitals:   11/24/12 1347  BP: 90/52  Pulse: 50  Weight: 179 lb 4 oz (81.307 kg)  SpO2: 95%     General:  Well appearing. No resp difficulty Wife present HEENT: normal Neck: supple. JVP 5-6. Carotids 2+ bilaterally; no bruits. No lymphadenopathy or thryomegaly appreciated. Cor: PMI laterally displaced. No S3.   Irregular rate & rhythm. R upper chest hickman. No rubs. 2/6 MR Lungs: clear Abdomen: soft, nontender,  Mildly distended. No hepatosplenomegaly. No bruits or masses. Good bowel sounds. Extremities: no cyanosis, clubbing, rash, edema   Neuro: alert & orientedx3, cranial nerves grossly intact. Moves all 4 extremities w/o difficulty. Affect pleasant.      ASSESSMENT & PLAN:

## 2012-11-24 NOTE — Assessment & Plan Note (Signed)
S/P successful DC-CV 09/16/12. [employer] interrogated. In SR 100% of the time. Continue amiodarone and coumadin.

## 2012-12-02 ENCOUNTER — Encounter: Payer: Self-pay | Admitting: Internal Medicine

## 2012-12-04 ENCOUNTER — Encounter: Payer: Self-pay | Admitting: *Deleted

## 2012-12-11 ENCOUNTER — Encounter: Payer: Self-pay | Admitting: Internal Medicine

## 2012-12-18 ENCOUNTER — Encounter: Payer: Self-pay | Admitting: Internal Medicine

## 2013-01-02 ENCOUNTER — Encounter: Payer: Self-pay | Admitting: Internal Medicine

## 2013-01-21 ENCOUNTER — Encounter: Payer: Self-pay | Admitting: Internal Medicine

## 2013-02-16 ENCOUNTER — Encounter: Payer: Self-pay | Admitting: Internal Medicine

## 2013-02-18 ENCOUNTER — Other Ambulatory Visit: Payer: Self-pay

## 2013-02-18 ENCOUNTER — Other Ambulatory Visit: Payer: Self-pay | Admitting: *Deleted

## 2013-02-18 MED ORDER — SILDENAFIL CITRATE 20 MG PO TABS: 20.0000 mg | ORAL_TABLET | Freq: Three times a day (TID) | ORAL | Status: AC

## 2013-02-23 ENCOUNTER — Encounter: Payer: Self-pay | Admitting: *Deleted

## 2013-02-26 ENCOUNTER — Encounter (HOSPITAL_COMMUNITY): Payer: Self-pay

## 2013-03-03 ENCOUNTER — Ambulatory Visit (HOSPITAL_COMMUNITY)
Admission: RE | Admit: 2013-03-03 | Discharge: 2013-03-03 | Disposition: A | Discharge status: Home / Self Care | Payer: Medicare Other | Source: Ambulatory Visit | Attending: Internal Medicine | Admitting: Internal Medicine

## 2013-03-03 ENCOUNTER — Encounter: Payer: Self-pay | Admitting: Internal Medicine

## 2013-03-03 ENCOUNTER — Encounter (HOSPITAL_COMMUNITY): Payer: Self-pay

## 2013-03-03 VITALS — BP 100/58 | HR 53 | Wt 182.0 lb

## 2013-03-03 DIAGNOSIS — I4891 Unspecified atrial fibrillation: Secondary | ICD-10-CM

## 2013-03-03 DIAGNOSIS — I5022 Chronic systolic (congestive) heart failure: Secondary | ICD-10-CM

## 2013-03-03 NOTE — Patient Instructions (Addendum)
We will contact you in 4 months to schedule your next appointment.  

## 2013-03-03 NOTE — Progress Notes (Signed)
Patient ID: Gregg [name], male   DOB: 01-07-1941, 72 y.o.   MRN: [id number] PCP (manages PT/INR): Dr Allene Dillon-   HPI  Gregg [name] is a 71 y.o. 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  and CKD baseline 1.37.  He has sought at second opinion with Dr Adriana Simas in [place]. On 04/02/12 underwent neurocognitive testing to assess his candidacy for advanced therapies. Not a candidate for advanced therapies due to results for neurocognitive results.   LHC (02/14/12) demonstrated normal coronaries. Hemodynamics were Ao Pressure: 79/68 (55), LV Pressure: 82/18 (26), Pulmonary artery sats 27% and 28%, RA 21, PA 59/45 (52), PCWP 33, and Fick 2.1/1.1. IABP was placed in addition to milrinone 0.25 mcg/kg/min support for cardiogenic shock. IABP was weaned and milrinone continued. He did well and was able to be discharged home on home milrinone. Revatio was also started for increased PA pressures and PVR. Discharge weight in 8/13 was 156 pounds.    02/21/2012 ECHO EF  10-15% 05/12/12 ECHO EF 15% central Mitral Valve regurgitation 09/2012    ECHO 10-15% RV moderately HK moderate MR  He returns for follow up with his wife. He continues on chronic Milrinone at 0.375 mcg via Hickman. Says he feels really good. Can do almost anything he wants. No CP, SOB, orthopnea, PND, edema. Weight stable. No problems with infusion. Labs recently Cr 1.6 (stable) Mg 1.7. Takes 2g magnesium per day.  No ICD firings. Carvedilol cut back to 3.125 recently due to bradycardia.     ROS: All systems negative except as listed in HPI, PMH and Problem List.  Past Medical History  Diagnosis Date  . Dysrhythmia   . ICD (implantable cardiac defibrillator) in place   . GERD  (gastroesophageal reflux disease)   . Chronic kidney disease   . Anemia   . Pacemaker   . CHF (congestive heart failure)   . Sleep apnea     uses CPAP    Current Outpatient Prescriptions  Medication Sig Dispense Refill  . amiodarone (PACERONE) 400 MG tablet Take 0.5 tablets (200 mg total) by mouth 2 (two) times daily.  90 tablet  3  . aspirin 81 MG tablet Take 81 mg by mouth daily.      . carvedilol (COREG) 6.25 MG tablet Take 3.125 mg by mouth every evening.      . cetirizine (ZYRTEC) 10 MG tablet Take 10 mg by mouth daily.      . digoxin (LANOXIN) 0.125 MG tablet Take 1 tablet (0.125 mg total) by mouth daily.  90 tablet  3  . docusate sodium (COLACE) 100 MG capsule Take 100 mg by mouth daily as needed for constipation.       . enalapril (VASOTEC) 10 MG tablet Take 1 tablet (10 mg total) by mouth 2 (two) times daily.  180 tablet  3  . Fe Fum-FA-B Cmp-C-Zn-Mg-Mn-Cu (FERROCITE PLUS PO) Take 1 tablet by mouth daily.      . furosemide (LASIX) 40 MG tablet Take 1 tablet (40 mg total) by mouth 2 (two) times daily. MAY TAKE EXTRA TAB AS NEEDED FOR SWELLING  75 tablet  3  . hydrocortisone cream 1 % Apply 1 application topically 2 (two) times daily as needed (for itching or tape irritation).       Marland Kitchen  magnesium oxide (MAG-OX) 400 MG tablet Take 800 mg by mouth daily. 2 tablets (800 mg) every morning and 3 tablets (1200 mg) every evening      . milrinone (PRIMACOR) 200-5 MCG/ML-% infusion Inject 19.5 mcg/min into the vein continuous.  100 mL    . Multiple Vitamins-Minerals (MULTIVITAMIN WITH MINERALS) tablet Take 1 tablet by mouth daily.      . pantoprazole (PROTONIX) 40 MG tablet Take 40 mg by mouth daily.      Marland Kitchen pyridOXINE (VITAMIN B-6) 100 MG tablet Take 100 mg by mouth daily.      Marland Kitchen senna (SENOKOT) 8.6 MG TABS Take 1 tablet by mouth daily.       . sildenafil (REVATIO) 20 MG tablet Take 1 tablet (20 mg total) by mouth 3 (three) times daily.  90 tablet  12  . thiamine (VITAMIN B-1) 100 MG tablet  Take 100 mg by mouth daily.      . ursodiol (ACTIGALL) 300 MG capsule Take 300 mg by mouth 2 (two) times daily.      Marland Kitchen warfarin (COUMADIN) 5 MG tablet Take 5-7.5 mg by mouth daily. 5mg  every day except 7.5mg  Tuesdays and Fridays       No current facility-administered medications for this encounter.   Facility-Administered Medications Ordered in Other Encounters  Medication Dose Route Frequency Provider Last Rate Last Dose  . 0.9 %  sodium chloride infusion    Continuous PRN Lovie Chol, CRNA         PHYSICAL EXAM: Filed Vitals:   03/03/13 1116  BP: 100/58  Pulse: 53  Weight: 182 lb (82.555 kg)  SpO2: 97%     General:  Well appearing. No resp difficulty Wife present HEENT: normal Neck: supple. JVP 5-6. Carotids 2+ bilaterally; no bruits. No lymphadenopathy or thryomegaly appreciated. Cor: PMI laterally displaced. No S3.   Irregular rate & rhythm. R upper chest hickman. No rubs. 2/6 MR Lungs: clear Abdomen: soft, nontender,  nondistended. No hepatosplenomegaly. No bruits or masses. Good bowel sounds. Extremities: no cyanosis, clubbing, rash, edema   Neuro: alert & orientedx3, cranial nerves grossly intact. Moves all 4 extremities w/o difficulty. Affect pleasant.    ASSESSMENT & PLAN: 1) Chronic systolic HF with biventricular dysfunction     -milrinone dependent     -not candidate for advanced therapies due to poor performance on neurocognitive testing 2) Dementia 3) AF, on coumadin 4) OSA  Continues to do very well with milrinone. NYHA II. Will continue chronic inotropic support. Volume status looks good. Reinforced need for daily weights and reviewed use of sliding scale diuretics.  Gregg Chichester,MD 10:19 PM

## 2013-03-05 ENCOUNTER — Encounter: Payer: Self-pay | Admitting: Internal Medicine

## 2013-03-09 ENCOUNTER — Telehealth (HOSPITAL_COMMUNITY): Payer: Self-pay | Admitting: Adult Health

## 2013-03-09 ENCOUNTER — Encounter: Payer: Self-pay | Admitting: Internal Medicine

## 2013-03-09 NOTE — Telephone Encounter (Signed)
Left message to return regarding lab work.   Creatinine elevated from 1.4 >1.7  Will need to hold lasix for 2 days.   Repeat BMET next week.   Babette Stum 12:16 PM

## 2013-03-09 NOTE — Telephone Encounter (Signed)
Spoke w/pt's wife, she will have pt hold lasix tonight and tomorrow and then restart Wednesday, pt is scheduled for weekly labs with home health

## 2013-03-20 ENCOUNTER — Encounter: Payer: Self-pay | Admitting: Internal Medicine

## 2013-04-07 ENCOUNTER — Other Ambulatory Visit (HOSPITAL_COMMUNITY): Payer: Self-pay | Admitting: *Deleted

## 2013-04-07 DIAGNOSIS — I5022 Chronic systolic (congestive) heart failure: Secondary | ICD-10-CM

## 2013-04-07 MED ORDER — DIGOXIN 125 MCG PO TABS: 0.1250 mg | ORAL_TABLET | Freq: Every day | ORAL | Status: DC

## 2013-04-09 ENCOUNTER — Encounter: Payer: Self-pay | Admitting: Internal Medicine

## 2013-04-16 ENCOUNTER — Other Ambulatory Visit (HOSPITAL_COMMUNITY): Payer: Self-pay | Admitting: *Deleted

## 2013-04-16 ENCOUNTER — Encounter: Payer: Self-pay | Admitting: Internal Medicine

## 2013-04-16 DIAGNOSIS — I5022 Chronic systolic (congestive) heart failure: Secondary | ICD-10-CM

## 2013-04-16 MED ORDER — DIGOXIN 125 MCG PO TABS: 0.1250 mg | ORAL_TABLET | Freq: Every day | ORAL | Status: DC

## 2013-04-17 ENCOUNTER — Encounter: Payer: Self-pay | Admitting: Internal Medicine

## 2013-04-18 DIAGNOSIS — Z452 Encounter for adjustment and management of vascular access device: Secondary | ICD-10-CM

## 2013-04-18 DIAGNOSIS — I5023 Acute on chronic systolic (congestive) heart failure: Secondary | ICD-10-CM

## 2013-04-18 DIAGNOSIS — Z79899 Other long term (current) drug therapy: Secondary | ICD-10-CM

## 2013-04-18 DIAGNOSIS — I4891 Unspecified atrial fibrillation: Secondary | ICD-10-CM

## 2013-04-18 DIAGNOSIS — I428 Other cardiomyopathies: Secondary | ICD-10-CM

## 2013-04-23 ENCOUNTER — Encounter: Payer: Self-pay | Admitting: Internal Medicine

## 2013-04-30 ENCOUNTER — Encounter: Payer: Self-pay | Admitting: Internal Medicine

## 2013-05-08 ENCOUNTER — Encounter: Payer: Self-pay | Admitting: Internal Medicine

## 2013-05-21 ENCOUNTER — Other Ambulatory Visit: Payer: Self-pay

## 2013-06-02 ENCOUNTER — Encounter: Payer: Self-pay | Admitting: *Deleted

## 2013-06-10 ENCOUNTER — Encounter: Payer: Self-pay | Admitting: Internal Medicine

## 2013-06-16 ENCOUNTER — Other Ambulatory Visit (HOSPITAL_COMMUNITY): Payer: Self-pay | Admitting: Internal Medicine

## 2013-06-17 DIAGNOSIS — I4891 Unspecified atrial fibrillation: Secondary | ICD-10-CM

## 2013-06-17 DIAGNOSIS — Z452 Encounter for adjustment and management of vascular access device: Secondary | ICD-10-CM

## 2013-06-17 DIAGNOSIS — I428 Other cardiomyopathies: Secondary | ICD-10-CM

## 2013-06-17 DIAGNOSIS — I5023 Acute on chronic systolic (congestive) heart failure: Secondary | ICD-10-CM

## 2013-06-17 DIAGNOSIS — Z79899 Other long term (current) drug therapy: Secondary | ICD-10-CM

## 2013-06-19 ENCOUNTER — Ambulatory Visit (INDEPENDENT_AMBULATORY_CARE_PROVIDER_SITE_OTHER): Payer: Medicare Other | Admitting: Internal Medicine

## 2013-06-19 ENCOUNTER — Encounter: Payer: Self-pay | Admitting: Internal Medicine

## 2013-06-19 VITALS — BP 99/53 | HR 55 | Ht 70.0 in | Wt 190.0 lb

## 2013-06-19 DIAGNOSIS — I5022 Chronic systolic (congestive) heart failure: Secondary | ICD-10-CM

## 2013-06-19 DIAGNOSIS — R570 Cardiogenic shock: Secondary | ICD-10-CM

## 2013-06-19 DIAGNOSIS — R059 Cough, unspecified: Secondary | ICD-10-CM

## 2013-06-19 DIAGNOSIS — R05 Cough: Secondary | ICD-10-CM | POA: Insufficient documentation

## 2013-06-19 DIAGNOSIS — I4891 Unspecified atrial fibrillation: Secondary | ICD-10-CM

## 2013-06-19 LAB — MDC_IDC_ENUM_SESS_TYPE_INCLINIC
HighPow Impedance: 47 Ohm
Lead Channel Impedance Value: 460 Ohm
Lead Channel Pacing Threshold Amplitude: 0.9 V
Lead Channel Pacing Threshold Pulse Width: 0.4 ms
Lead Channel Pacing Threshold Pulse Width: 0.6 ms
Lead Channel Sensing Intrinsic Amplitude: 21.9 mV
Lead Channel Sensing Intrinsic Amplitude: 7 mV
Lead Channel Setting Pacing Amplitude: 2.4 V
Lead Channel Setting Pacing Amplitude: 2.4 V
Lead Channel Setting Pacing Pulse Width: 0.6 ms
Zone Setting Detection Interval: 292.7 ms

## 2013-06-19 NOTE — Progress Notes (Signed)
HPI Gregg [name] returns today for followup. He is a pleasant 72 yo man with a h/o non-ischemic CM, chronic systolic heart failure, now on milrinone, who is s/p ICD implant in the past. He has moved to [place], [place] and returns today for ICD evaluation and management. He has had no recent ICD shocks, no chest pain and on milrinone, his heart failure symptoms are class2. He notes an intermittent dry cough. This is not severe but has been noticeable in the last several months. He denies increasing weight or heart failure symptoms. No Known Allergies   Current Outpatient Prescriptions  Medication Sig Dispense Refill  . amiodarone (PACERONE) 400 MG tablet Take 200 mg by mouth daily.      Marland Kitchen aspirin 81 MG tablet Take 81 mg by mouth daily.      . carvedilol (COREG) 6.25 MG tablet Take 3.125 mg by mouth every evening.      . cetirizine (ZYRTEC) 10 MG tablet Take 10 mg by mouth daily.      . digoxin (LANOXIN) 0.125 MG tablet Take 1 tablet (0.125 mg total) by mouth daily.  90 tablet  3  . docusate sodium (COLACE) 100 MG capsule Take 100 mg by mouth daily as needed for constipation.       . enalapril (VASOTEC) 10 MG tablet Take 1 tablet (10 mg total) by mouth 2 (two) times daily.  180 tablet  3  . Fe Fum-FA-B Cmp-C-Zn-Mg-Mn-Cu (FERROCITE PLUS PO) Take 1 tablet by mouth daily.      . furosemide (LASIX) 40 MG tablet Take 1 tablet (40 mg total) by mouth 2 (two) times daily. MAY TAKE EXTRA TAB AS NEEDED FOR EDEMA  75 tablet  3  . hydrocortisone cream 1 % Apply 1 application topically 2 (two) times daily as needed (for itching or tape irritation).       . magnesium oxide (MAG-OX) 400 MG tablet Take 800 mg by mouth daily. 2 tablets (800 mg) every morning and 3 tablets (1200 mg) every evening      . milrinone (PRIMACOR) 200-5 MCG/ML-% infusion Inject 19.5 mcg/min into the vein continuous.  100 mL    . Multiple Vitamins-Minerals (MULTIVITAMIN WITH MINERALS) tablet Take 1 tablet by mouth daily.      . pantoprazole  (PROTONIX) 40 MG tablet Take 40 mg by mouth daily.      Marland Kitchen pyridOXINE (VITAMIN B-6) 100 MG tablet Take 100 mg by mouth daily.      Marland Kitchen senna (SENOKOT) 8.6 MG TABS Take 1 tablet by mouth daily.       . sildenafil (REVATIO) 20 MG tablet Take 1 tablet (20 mg total) by mouth 3 (three) times daily.  90 tablet  12  . thiamine (VITAMIN B-1) 100 MG tablet Take 100 mg by mouth daily.      . ursodiol (ACTIGALL) 300 MG capsule Take 300 mg by mouth 2 (two) times daily.      Marland Kitchen warfarin (COUMADIN) 5 MG tablet Take 5-7.5 mg by mouth daily. 5mg  every day except 7.5mg  Tuesdays and Fridays       No current facility-administered medications for this visit.   Facility-Administered Medications Ordered in Other Visits  Medication Dose Route Frequency Provider Last Rate Last Dose  . 0.9 %  sodium chloride infusion    Continuous PRN Lovie Chol, CRNA         Past Medical History  Diagnosis Date  . Dysrhythmia   . ICD (implantable cardiac defibrillator) in place   .  GERD (gastroesophageal reflux disease)   . Chronic kidney disease   . Anemia   . Pacemaker   . CHF (congestive heart failure)   . Sleep apnea     uses CPAP    ROS:   All systems reviewed and negative except as noted in the HPI.   Past Surgical History  Procedure Laterality Date  . Insert / replace / remove pacemaker    . Colon surgery      ruptured colon  . Tee without cardioversion N/A 09/16/2012    Procedure: TRANSESOPHAGEAL ECHOCARDIOGRAM (TEE);  Surgeon: Dolores Patty, MD;  [facility] ENDOSCOPY;  Service: Cardiovascular;  Laterality: N/A;  . Cardioversion N/A 09/16/2012    Procedure: CARDIOVERSION;  Surgeon: Dolores Patty, MD;  Location: [facility] ENDOSCOPY;  Service: Cardiovascular;  Laterality: N/A;     No family history on file.   History   Social History  . Marital Status: Married    Spouse Name: N/A    Number of Children: N/A  . Years of Education: N/A   Occupational History  . Not on file.   Social History  Main Topics  . Smoking status: Former Games developer  . Smokeless tobacco: Never Used  . Alcohol Use: No  . Drug Use: No  . Sexual Activity: Yes   Other Topics Concern  . Not on file   Social History Narrative  . No narrative on file     BP 99/53  Pulse 55  Ht 5\' 10"  (1.778 m)  Wt 190 lb (86.183 kg)  BMI 27.26 kg/m2  Physical Exam:  Well appearing 72 yo man, NAD HEENT: Unremarkable Neck:  7 cm JVD, no thyromegally Lungs:  Clear with no wheezes, rales, or rhonchi. HEART:  Regular rate rhythm, no murmurs, no rubs, no clicks, soft S3. Abd:  soft, positive bowel sounds, no organomegally, no rebound, no guarding Ext:  2 plus pulses, no edema, no cyanosis, no clubbing Skin:  No rashes no nodules Neuro:  CN II through XII intact, motor grossly intact  DEVICE  Normal device function.  See PaceArt for details.   Assess/Plan:

## 2013-06-19 NOTE — Assessment & Plan Note (Signed)
His [employer] ICD is working normally. We'll plan to recheck in several months. 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 He is pacing approximately 60% in the ventricle. Much of this represents fusion. Because he has done so well, I am not inclined to attempt left ventricular lead placement at this time.

## 2013-06-19 NOTE — Assessment & Plan Note (Signed)
It is unclear whether his cough represents volume overload, amiodarone, or his ACE inhibitor. I suspect the latter. His weight is not up and he has not more dyspneic. We'll plan to check a sedimentation rate in an attempt to exclude amiodarone lung toxicity. I will defer medication change to his primary cardiologist.

## 2013-06-19 NOTE — Assessment & Plan Note (Signed)
He is maintaining sinus rhythm on amiodarone 200 mg daily. He will continue his current medications.

## 2013-06-19 NOTE — Assessment & Plan Note (Signed)
His heart failure symptoms appear to be class II. He'll continue his current medical therapy under the direction of our heart failure clinic.

## 2013-06-19 NOTE — Patient Instructions (Addendum)
Your physician recommends that you schedule a follow-up appointment in:  1 year with Dr Raylene Miyamoto will receive a reminder letter two months in advance reminding you to call and schedule your appointment. If you don't receive this letter, please contact our office.

## 2013-06-22 ENCOUNTER — Encounter: Payer: Self-pay | Admitting: Internal Medicine

## 2013-07-03 ENCOUNTER — Ambulatory Visit (HOSPITAL_COMMUNITY)
Admission: RE | Admit: 2013-07-03 | Discharge: 2013-07-03 | Disposition: A | Discharge status: Home / Self Care | Payer: Medicare Other | Source: Ambulatory Visit | Attending: Internal Medicine | Admitting: Internal Medicine

## 2013-07-03 ENCOUNTER — Encounter (HOSPITAL_COMMUNITY): Payer: Self-pay

## 2013-07-03 ENCOUNTER — Telehealth (HOSPITAL_COMMUNITY): Payer: Self-pay | Admitting: *Deleted

## 2013-07-03 VITALS — BP 102/74 | HR 69 | Resp 19 | Ht 70.5 in | Wt 187.0 lb

## 2013-07-03 DIAGNOSIS — I5022 Chronic systolic (congestive) heart failure: Secondary | ICD-10-CM

## 2013-07-03 DIAGNOSIS — K219 Gastro-esophageal reflux disease without esophagitis: Secondary | ICD-10-CM | POA: Insufficient documentation

## 2013-07-03 DIAGNOSIS — I509 Heart failure, unspecified: Secondary | ICD-10-CM | POA: Insufficient documentation

## 2013-07-03 DIAGNOSIS — I428 Other cardiomyopathies: Secondary | ICD-10-CM | POA: Insufficient documentation

## 2013-07-03 DIAGNOSIS — Z95810 Presence of automatic (implantable) cardiac defibrillator: Secondary | ICD-10-CM | POA: Insufficient documentation

## 2013-07-03 DIAGNOSIS — Z7901 Long term (current) use of anticoagulants: Secondary | ICD-10-CM | POA: Insufficient documentation

## 2013-07-03 DIAGNOSIS — I059 Rheumatic mitral valve disease, unspecified: Secondary | ICD-10-CM | POA: Insufficient documentation

## 2013-07-03 DIAGNOSIS — I2510 Atherosclerotic heart disease of native coronary artery without angina pectoris: Secondary | ICD-10-CM | POA: Insufficient documentation

## 2013-07-03 DIAGNOSIS — N189 Chronic kidney disease, unspecified: Secondary | ICD-10-CM | POA: Insufficient documentation

## 2013-07-03 DIAGNOSIS — I4891 Unspecified atrial fibrillation: Secondary | ICD-10-CM | POA: Insufficient documentation

## 2013-07-03 DIAGNOSIS — F0390 Unspecified dementia without behavioral disturbance: Secondary | ICD-10-CM | POA: Insufficient documentation

## 2013-07-03 DIAGNOSIS — G4733 Obstructive sleep apnea (adult) (pediatric): Secondary | ICD-10-CM | POA: Insufficient documentation

## 2013-07-03 MED ORDER — CARVEDILOL 6.25 MG PO TABS: 3.1250 mg | ORAL_TABLET | Freq: Every evening | ORAL | Status: DC

## 2013-07-03 MED ORDER — LOSARTAN POTASSIUM 25 MG PO TABS: 25.0000 mg | ORAL_TABLET | Freq: Every day | ORAL | Status: DC

## 2013-07-03 NOTE — Patient Instructions (Signed)
Stop enalapril  Start losartan 25 mg daily.  Doing great.  Have a wonderful Christmas and New Year.  Follow up 3 months with ECHO.  Do the following things EVERYDAY: 1) Weigh yourself in the morning before breakfast. Write it down and keep it in a log. 2) Take your medicines as prescribed 3) Eat low salt foods-Limit salt (sodium) to 2000 mg per day.  4) Stay as active as you can everyday 5) Limit all fluids for the day to less than 2 liters

## 2013-07-03 NOTE — Telephone Encounter (Signed)
Refill sent in

## 2013-07-03 NOTE — Progress Notes (Signed)
Patient ID: Gregg [name], male   DOB: Jul 27, 1940, 72 y.o.   MRN: [id number] PCP (manages PT/INR): Dr Allene Dillon-   HPI  Gregg [name] is a 72 y.o. 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  He has sought a second opinion with Dr Adriana Simas in [place]. On 04/02/12 underwent neurocognitive testing to assess his candidacy for advanced therapies. Not a candidate for advanced therapies due to results for neurocognitive results.   LHC (02/14/12) demonstrated normal coronaries. Hemodynamics were Ao Pressure: 79/68 (55), LV Pressure: 82/18 (26), Pulmonary artery sats 27% and 28%, RA 21, PA 59/45 (52), PCWP 33, and Fick 2.1/1.1. IABP was placed in addition to milrinone 0.25 mcg/kg/min support for cardiogenic shock. IABP was weaned and milrinone continued. He did well and was able to be discharged home on home milrinone. Revatio was also started for increased PA pressures and PVR. Discharge weight in 8/13 was 156 pounds.   02/21/2012 ECHO EF  10-15% 05/12/12 ECHO EF 15% central Mitral Valve regurgitation 09/2012    ECHO 10-15% RV moderately HK moderate MR  Follow up: Doing well. Continues with home milrinone 0.375 mcg through [facility] cath. Denies CP, SOB, orthopnea, or edema. +dry cough. Can walk at least a mile before stopping. Able to go upstairs and inclines. On the days he does not walk on trail he walks on the treadmill for 30 min. Trying to follow a low salt diet and drinking less than 2 L a day. Weight 179-180 lbs at home. Wears CPAP at night. + getting full quickly.    ROS: All systems negative except as listed in HPI, PMH and Problem List.  Past Medical History  Diagnosis Date  . Dysrhythmia   . ICD (implantable  cardiac defibrillator) in place   . GERD (gastroesophageal reflux disease)   . Chronic kidney disease   . Anemia   . Pacemaker   . CHF (congestive heart failure)   . Sleep apnea     uses CPAP    Current Outpatient Prescriptions  Medication Sig Dispense Refill  . amiodarone (PACERONE) 400 MG tablet Take 200 mg by mouth daily.      Marland Kitchen aspirin 81 MG tablet Take 81 mg by mouth daily.      . carvedilol (COREG) 6.25 MG tablet Take 0.5 tablets (3.125 mg total) by mouth every evening.  15 tablet  3  . cetirizine (ZYRTEC) 10 MG tablet Take 10 mg by mouth daily.      . digoxin (LANOXIN) 0.125 MG tablet Take 1 tablet (0.125 mg total) by mouth daily.  90 tablet  3  . docusate sodium (COLACE) 100 MG capsule Take 100 mg by mouth daily as needed for constipation.       . enalapril (VASOTEC) 10 MG tablet Take 1 tablet (10 mg total) by mouth 2 (two) times daily.  180 tablet  3  . Fe Fum-FA-B Cmp-C-Zn-Mg-Mn-Cu (FERROCITE PLUS PO) Take 1 tablet by mouth daily.      . furosemide (LASIX) 40 MG tablet Take 1 tablet (40 mg total) by mouth 2 (two) times daily. MAY TAKE EXTRA TAB AS NEEDED FOR EDEMA  75 tablet  3  . hydrocortisone cream 1 % Apply 1 application topically 2 (two) times daily  as needed (for itching or tape irritation).       . magnesium oxide (MAG-OX) 400 MG tablet Take 800 mg by mouth daily. 2 tablets (800 mg) every morning and 3 tablets (1200 mg) every evening      . milrinone (PRIMACOR) 200-5 MCG/ML-% infusion Inject 19.5 mcg/min into the vein continuous.  100 mL    . Multiple Vitamins-Minerals (MULTIVITAMIN WITH MINERALS) tablet Take 1 tablet by mouth daily.      . pantoprazole (PROTONIX) 40 MG tablet Take 40 mg by mouth daily.      Marland Kitchen pyridOXINE (VITAMIN B-6) 100 MG tablet Take 100 mg by mouth daily.      Marland Kitchen senna (SENOKOT) 8.6 MG TABS Take 1 tablet by mouth daily.       . sildenafil (REVATIO) 20 MG tablet Take 1 tablet (20 mg total) by mouth 3 (three) times daily.  90 tablet  12  . thiamine  (VITAMIN B-1) 100 MG tablet Take 100 mg by mouth daily.      Marland Kitchen warfarin (COUMADIN) 5 MG tablet Take 5-7.5 mg by mouth daily. 5mg  every day except 7.5mg  Tuesdays and Fridays       No current facility-administered medications for this encounter.   Facility-Administered Medications Ordered in Other Encounters  Medication Dose Route Frequency Provider Last Rate Last Dose  . 0.9 %  sodium chloride infusion    Continuous PRN Lovie Chol, CRNA          Filed Vitals:   07/03/13 1030  BP: 102/74  Pulse: 69  Resp: 19  Height: 5' 10.5" (1.791 m)  Weight: 187 lb (84.823 kg)  SpO2: 95%   PHYSICAL EXAM: General:  Well appearing. No resp difficulty Wife present HEENT: normal Neck: supple. JVP 6-7. Carotids 2+ bilaterally; no bruits. No lymphadenopathy or thryomegaly appreciated. Cor: PMI laterally displaced. No S3. Regular rate & rhythm. R upper chest hickman. No rubs. 2/6 MR Lungs: clear Abdomen: soft, nontender,  nondistended. No hepatosplenomegaly. No bruits or masses. Good bowel sounds. Extremities: no cyanosis, clubbing, rash, edema   Neuro: alert & orientedx3, cranial nerves grossly intact. Moves all 4 extremities w/o difficulty. Affect pleasant.   ASSESSMENT & PLAN: 1) Chronic systolic HF with biventricular dysfunction     -milrinone dependent     -not candidate for advanced therapies due to poor performance on neurocognitive testing 2) Dementia 3) AF, on coumadin 4) OSA  Doing well on home milrinone. Volume status stable. +Dry cough, will switch from enalapril to losartan 25 mg daily. Told to call if he has any dizziness. Will repeat ECHO at next visit, 3 months. NYHA II symptoms, continue home milrinone will not try to wean. Reinforced the need and importance of daily weights, a low sodium diet, and fluid restriction (less than 2 L a day). Instructed to call the HF clinic if weight increases more than 3 lbs overnight or 5 lbs in a week.   Aundria Rud, NP-C 10:52  AM  Patient seen and examined with Ulla Potash, NP. We discussed all aspects of the encounter. I agree with the assessment and plan as stated above.   Continues to do extremely well with home milrinone support. He is not VAD candidate due to neuro cognitive issues. Will continue current regimen. Ok with switch from ACE to ARB due to cough. Reinforced need for daily weights and reviewed use of sliding scale diuretics.  Daniel Bensimhon,MD 12:20 PM

## 2013-07-06 ENCOUNTER — Other Ambulatory Visit (HOSPITAL_COMMUNITY): Payer: Self-pay | Admitting: *Deleted

## 2013-07-06 MED ORDER — CARVEDILOL 6.25 MG PO TABS: 3.1250 mg | ORAL_TABLET | Freq: Every evening | ORAL | Status: DC

## 2013-07-08 ENCOUNTER — Encounter: Payer: Self-pay | Admitting: Internal Medicine

## 2013-07-21 ENCOUNTER — Encounter: Payer: Self-pay | Admitting: Internal Medicine

## 2013-07-21 ENCOUNTER — Emergency Department (HOSPITAL_COMMUNITY): Payer: Medicare Other

## 2013-07-21 ENCOUNTER — Encounter (HOSPITAL_COMMUNITY): Payer: Self-pay | Admitting: Emergency Medicine

## 2013-07-21 ENCOUNTER — Emergency Department (HOSPITAL_COMMUNITY)
Admission: EM | Admit: 2013-07-21 | Discharge: 2013-07-21 | Disposition: A | Discharge status: Home / Self Care | Payer: Medicare Other | Attending: Emergency Medicine | Admitting: Emergency Medicine

## 2013-07-21 DIAGNOSIS — J40 Bronchitis, not specified as acute or chronic: Secondary | ICD-10-CM

## 2013-07-21 DIAGNOSIS — J209 Acute bronchitis, unspecified: Secondary | ICD-10-CM | POA: Insufficient documentation

## 2013-07-21 DIAGNOSIS — G4730 Sleep apnea, unspecified: Secondary | ICD-10-CM | POA: Insufficient documentation

## 2013-07-21 DIAGNOSIS — Z87891 Personal history of nicotine dependence: Secondary | ICD-10-CM | POA: Insufficient documentation

## 2013-07-21 DIAGNOSIS — Z95810 Presence of automatic (implantable) cardiac defibrillator: Secondary | ICD-10-CM | POA: Insufficient documentation

## 2013-07-21 DIAGNOSIS — Z7982 Long term (current) use of aspirin: Secondary | ICD-10-CM | POA: Insufficient documentation

## 2013-07-21 DIAGNOSIS — Z7901 Long term (current) use of anticoagulants: Secondary | ICD-10-CM | POA: Insufficient documentation

## 2013-07-21 DIAGNOSIS — D649 Anemia, unspecified: Secondary | ICD-10-CM | POA: Insufficient documentation

## 2013-07-21 DIAGNOSIS — I509 Heart failure, unspecified: Secondary | ICD-10-CM | POA: Insufficient documentation

## 2013-07-21 DIAGNOSIS — K219 Gastro-esophageal reflux disease without esophagitis: Secondary | ICD-10-CM | POA: Insufficient documentation

## 2013-07-21 DIAGNOSIS — R042 Hemoptysis: Secondary | ICD-10-CM | POA: Insufficient documentation

## 2013-07-21 DIAGNOSIS — N189 Chronic kidney disease, unspecified: Secondary | ICD-10-CM | POA: Insufficient documentation

## 2013-07-21 LAB — BASIC METABOLIC PANEL
BUN: 14 mg/dL (ref 6–23)
CO2: 29 mEq/L (ref 19–32)
Calcium: 9.8 mg/dL (ref 8.4–10.5)
Chloride: 97 mEq/L (ref 96–112)
Creatinine, Ser: 1.29 mg/dL (ref 0.50–1.35)
GFR calc Af Amer: 63 mL/min — ABNORMAL LOW (ref >90)
GFR, EST NON AFRICAN AMERICAN: 54 mL/min — AB (ref >90)
GLUCOSE: 90 mg/dL (ref 70–99)
POTASSIUM: 4.1 meq/L (ref 3.7–5.3)
SODIUM: 138 meq/L (ref 137–147)

## 2013-07-21 LAB — CBC
HEMATOCRIT: 38.4 % — AB (ref 39.0–52.0)
HEMOGLOBIN: 12.9 g/dL — AB (ref 13.0–17.0)
MCH: 31.9 pg (ref 26.0–34.0)
MCHC: 33.6 g/dL (ref 30.0–36.0)
MCV: 95 fL (ref 78.0–100.0)
Platelets: 269 10*3/uL (ref 150–400)
RBC: 4.04 MIL/uL — AB (ref 4.22–5.81)
RDW: 13.9 % (ref 11.5–15.5)
WBC: 8.6 10*3/uL (ref 4.0–10.5)

## 2013-07-21 LAB — POCT I-STAT TROPONIN I: Troponin i, poc: 0.03 ng/mL (ref 0.00–0.08)

## 2013-07-21 LAB — PRO B NATRIURETIC PEPTIDE: Pro B Natriuretic peptide (BNP): 1898 pg/mL — ABNORMAL HIGH (ref 0–125)

## 2013-07-21 LAB — PROTIME-INR
INR: 2.58 — AB (ref 0.00–1.49)
Prothrombin Time: 26.8 seconds — ABNORMAL HIGH (ref 11.6–15.2)

## 2013-07-21 MED ORDER — CEPHALEXIN 500 MG PO CAPS: 500.0000 mg | ORAL_CAPSULE | Freq: Four times a day (QID) | ORAL | Status: DC

## 2013-07-21 MED ORDER — ALBUTEROL SULFATE HFA 108 (90 BASE) MCG/ACT IN AERS
2.0000 | INHALATION_SPRAY | Freq: Once | RESPIRATORY_TRACT | Status: AC
  Administered 2013-07-21: 2 via RESPIRATORY_TRACT
  Filled 2013-07-21: qty 6.7

## 2013-07-21 MED ORDER — CEPHALEXIN 250 MG PO CAPS
500.0000 mg | ORAL_CAPSULE | Freq: Once | ORAL | Status: AC
  Administered 2013-07-21: 500 mg via ORAL
  Filled 2013-07-21: qty 2

## 2013-07-21 NOTE — ED Provider Notes (Signed)
CSN: [id number]     Arrival date & time 07/21/13  1033 History   First MD Initiated Contact with Patient 07/21/13 1147     Chief Complaint  Patient presents with  . Cough  . Headache   (Consider location/radiation/quality/duration/timing/severity/associated sxs/prior Treatment) HPI Comments: Patient is 73 year old patient of Dr. Allene Dillon, and Dr. Clarise Cruz with cardiology who presents to the ED for evaluation of chronic (since Thanksgiving) cough.  He reports that the cough is dry and hacking, no sputum until today when he noted pink tinged sputum.  He has previously seen Dr. Clarise Cruz who changed his medication from an ACE inhibitor to different medication and this has not helped.  He became concerned today with the pink sputum.  He denies fever, chills, nausea, vomiting, bright red hemoptysis, chest pain, shortness of breath though he does endorse occasional wheezing and a runny nose.    Patient is a 73 y.o. male presenting with cough and headaches. The history is provided by the patient and the spouse. No language interpreter was used.  Cough Cough characteristics:  Dry and hacking Severity:  Moderate Onset quality:  Gradual Duration:  12 weeks Timing:  Constant Progression:  Worsening Chronicity:  Recurrent Smoker: no   Context: not animal exposure, not exposure to allergens, not fumes, not occupational exposure, not upper respiratory infection, not weather changes and not with activity   Relieved by:  Nothing Worsened by:  Nothing tried Ineffective treatments:  None tried Associated symptoms: headaches, rhinorrhea and wheezing   Associated symptoms: no chest pain, no chills, no ear pain, no eye discharge, no fever, no rash, no shortness of breath, no sinus congestion, no sore throat and no weight loss   Headache Associated symptoms: cough   Associated symptoms: no ear pain, no fever and no sore throat     Past Medical History  Diagnosis Date  . Dysrhythmia   . ICD (implantable  cardiac defibrillator) in place   . GERD (gastroesophageal reflux disease)   . Chronic kidney disease   . Anemia   . Pacemaker   . CHF (congestive heart failure)   . Sleep apnea     uses CPAP   Past Surgical History  Procedure Laterality Date  . Insert / replace / remove pacemaker    . Colon surgery      ruptured colon  . Tee without cardioversion N/A 09/16/2012    Procedure: TRANSESOPHAGEAL ECHOCARDIOGRAM (TEE);  Surgeon: Dolores Patty, MD;  Location: [facility] ENDOSCOPY;  Service: Cardiovascular;  Laterality: N/A;  . Cardioversion N/A 09/16/2012    Procedure: CARDIOVERSION;  Surgeon: Dolores Patty, MD;  Location: [facility] ENDOSCOPY;  Service: Cardiovascular;  Laterality: N/A;   No family history on file. History  Substance Use Topics  . Smoking status: Former Games developer  . Smokeless tobacco: Never Used  . Alcohol Use: No    Review of Systems  Constitutional: Negative for fever, chills and weight loss.  HENT: Positive for rhinorrhea. Negative for ear pain and sore throat.   Eyes: Negative for discharge.  Respiratory: Positive for cough and wheezing. Negative for shortness of breath.   Cardiovascular: Negative for chest pain.  Skin: Negative for rash.  Neurological: Positive for headaches.  All other systems reviewed and are negative.    Allergies  Review of patient's allergies indicates no known allergies.  Home Medications   Current Outpatient Rx  Name  Route  Sig  Dispense  Refill  . acetaminophen (TYLENOL) 650 MG CR tablet   Oral  Take 650 mg by mouth 2 (two) times daily as needed for pain.         Marland Kitchen amiodarone (PACERONE) 400 MG tablet   Oral   Take 200 mg by mouth daily.         Marland Kitchen aspirin EC 81 MG tablet   Oral   Take 81 mg by mouth daily.         . B Complex-Biotin-FA (HM VITAMIN B100 COMPLEX PO)   Oral   Take 1,000 mg by mouth daily with lunch.         . carvedilol (COREG) 6.25 MG tablet   Oral   Take 3.125 mg by mouth at bedtime.         .  cetirizine (ZYRTEC) 10 MG tablet   Oral   Take 10 mg by mouth daily.         . digoxin (LANOXIN) 0.125 MG tablet   Oral   Take 1 tablet (0.125 mg total) by mouth daily.   90 tablet   3   . docusate sodium (DULCOLAX) 100 MG capsule   Oral   Take 100 mg by mouth at bedtime.         . Fe Fum-FA-B Cmp-C-Zn-Mg-Mn-Cu (FERROCITE PLUS PO)   Oral   Take 1 tablet by mouth daily with lunch.          . furosemide (LASIX) 40 MG tablet   Oral   Take 40 mg by mouth 2 (two) times daily.         . GuaiFENesin (MUCINEX PO)   Oral   Take by mouth 2 (two) times daily as needed (cough). 1/2 capful         . losartan (COZAAR) 25 MG tablet   Oral   Take 1 tablet (25 mg total) by mouth daily.   30 tablet   6   . magnesium oxide (MAG-OX) 400 MG tablet   Oral   Take 800-1,200 mg by mouth daily. 2 tablets (800 mg) every morning and 3 tablets (1200 mg) at lunch         . Multiple Vitamins-Minerals (MULTIVITAMIN WITH MINERALS) tablet   Oral   Take 1 tablet by mouth daily with lunch.          . pantoprazole (PROTONIX) 40 MG tablet   Oral   Take 40 mg by mouth daily before breakfast.          . pyridOXINE (VITAMIN B-6) 100 MG tablet   Oral   Take 100 mg by mouth daily with lunch.          . senna (SENOKOT) 8.6 MG TABS   Oral   Take 1 tablet by mouth at bedtime.          . sildenafil (REVATIO) 20 MG tablet   Oral   Take 1 tablet (20 mg total) by mouth 3 (three) times daily.   90 tablet   12   . sodium chloride 0.9 % SOLN with milrinone 1 MG/ML SOLN 200 mcg/mL   Intravenous   Inject 0.25 mcg/kg/min into the vein continuous. Pt pump set at 1.2 ml/hr, current weight 81.8 kg,         . warfarin (COUMADIN) 5 MG tablet   Oral   Take 5-7.5 mg by mouth daily. 1 1/2 tablets (7.5mg ) Tuesdays and Fridays, 1 tablet (5 mg) on Sunday, Monday, Wednesday, Thursday, Saturday         . hydrocortisone cream 1 %   Topical  Apply 1 application topically 2 (two) times daily as  needed (for itching or tape irritation).           BP 98/50  Pulse 60  Temp(Src) 98.5 F (36.9 C) (Oral)  Resp 16  Ht 5\' 10"  (1.778 m)  Wt 180 lb (81.647 kg)  BMI 25.83 kg/m2  SpO2 96% Physical Exam  Nursing note and vitals reviewed. Constitutional: He is oriented to person, place, and time. He appears well-developed and well-nourished. No distress.  HENT:  Head: Normocephalic and atraumatic.  Right Ear: External ear normal.  Left Ear: External ear normal.  Mouth/Throat: Oropharynx is clear and moist. No oropharyngeal exudate.  Clear rhinorrhea  Eyes: Conjunctivae are normal. Pupils are equal, round, and reactive to light. No scleral icterus.  Neck: Normal range of motion. Neck supple.  Cardiovascular: Normal rate, regular rhythm and normal heart sounds.  Exam reveals no gallop and no friction rub.   No murmur heard. Pulmonary/Chest: Effort normal and breath sounds normal. No respiratory distress. He has no wheezes. He has no rales. He exhibits no tenderness.  No audible wheeze noted  Abdominal: Soft. Bowel sounds are normal. He exhibits no distension and no mass. There is no tenderness. There is no rebound and no guarding.  Musculoskeletal: Normal range of motion. He exhibits no edema and no tenderness.  Lymphadenopathy:    He has no cervical adenopathy.  Neurological: He is alert and oriented to person, place, and time. He exhibits normal muscle tone. Coordination normal.  Skin: Skin is warm and dry. No rash noted. No erythema. No pallor.  Psychiatric: He has a normal mood and affect. His behavior is normal. Judgment and thought content normal.    ED Course  Procedures (including critical care time) Labs Review Labs Reviewed  CBC - Abnormal; Notable for the following:    RBC 4.04 (*)    Hemoglobin 12.9 (*)    HCT 38.4 (*)    All other components within normal limits  BASIC METABOLIC PANEL - Abnormal; Notable for the following:    GFR calc non Af Amer 54 (*)    GFR  calc Af Amer 63 (*)    All other components within normal limits  PRO B NATRIURETIC PEPTIDE - Abnormal; Notable for the following:    Pro B Natriuretic peptide (BNP) 1898.0 (*)    All other components within normal limits  PROTIME-INR - Abnormal; Notable for the following:    Prothrombin Time 26.8 (*)    INR 2.58 (*)    All other components within normal limits  POCT I-STAT TROPONIN I   Imaging Review Dg Chest 2 View  07/21/2013   CLINICAL DATA:  Cough.  Headache.  EXAM: CHEST  2 VIEW  COMPARISON:  Chest radiograph 05/11/2012.  FINDINGS: Right-sided central venous catheter is present with tip projecting over the superior cavoatrial junction. Dual lead AICD device stable in position. Stable enlarged cardiac and mediastinal contours. No consolidative pulmonary opacities. No pleural effusion or pneumothorax Regional skeleton is unremarkable.  IMPRESSION: Central venous catheter tip projects over the superior cavoatrial junction.  No acute cardiopulmonary process.   Electronically Signed   By: Annia Belt M.D.   On: 07/21/2013 11:54    EKG Interpretation   None       MDM  Bronchitis hemoptysis  Patient here with worsening cough and wheezing over the past several weeks despite changing medications.  He reports no fever, chills, and there is no infiltrate on chest x-ray but we suspect  perhaps a bronchitis which we will treat with antibiotics and inhaler.  Patient is already on protonix so I do not suspect GERD as the cause, though this is still possible.  He already has appointment on Friday with [facility] pulmonology for evaluation which he will keep.     Izola PriceFrances C. Marisue HumbleSanford, PA-C 07/21/13 1527

## 2013-07-21 NOTE — ED Provider Notes (Signed)
Patient reports he started having coughing around Thanksgiving time. He states he generally has had a dry cough however recently he is starting to cough up some clear mucus or also white mucus. He states today he saw a few drops of blood mixed in mucus. He denies any fever, shortness of breath, or chest pain. He denies smoking. He has not been around anybody else who is ill. Patient is on Coumadin.  Patient is alert and cooperative in no distress. His lungs are clear to auscultation.  Medical screening examination/treatment/procedure(s) were conducted as a shared visit with non-physician practitioner(s) and myself.  I personally evaluated the patient during the encounter.  EKG Interpretation   None        Devoria Albe, MD, Armando Gang   Ward Givens, MD 07/21/13 [phone]

## 2013-07-21 NOTE — ED Notes (Signed)
Onset one month ago intermittent productive cough clear and slight pink tinge with headache 1/10 throbbing.

## 2013-07-21 NOTE — ED Notes (Signed)
Lab called to add pBNP per [facility] from lab- it can be added

## 2013-07-21 NOTE — Discharge Instructions (Signed)

## 2013-07-21 NOTE — ED Notes (Signed)
Pt did not get prescription for keflex 500mg  PO. This RN called prescription into pt. [facility] at [place], [place] and let patient know via telephone call.

## 2013-07-21 NOTE — ED Notes (Signed)
Pt did not receive rx. For Keflex- prescription called in to pt pharmacy. And voicemail left to update patient.

## 2013-07-24 ENCOUNTER — Encounter: Payer: Self-pay | Admitting: Internal Medicine

## 2013-07-24 ENCOUNTER — Ambulatory Visit (INDEPENDENT_AMBULATORY_CARE_PROVIDER_SITE_OTHER): Payer: Medicare Other | Admitting: Internal Medicine

## 2013-07-24 VITALS — BP 94/60 | HR 55 | Temp 98.0°F | Ht 70.0 in | Wt 190.0 lb

## 2013-07-24 DIAGNOSIS — R05 Cough: Secondary | ICD-10-CM

## 2013-07-24 DIAGNOSIS — I5022 Chronic systolic (congestive) heart failure: Secondary | ICD-10-CM

## 2013-07-24 DIAGNOSIS — R059 Cough, unspecified: Secondary | ICD-10-CM

## 2013-07-24 MED ORDER — BENZONATATE 200 MG PO CAPS: ORAL_CAPSULE | ORAL | Status: DC

## 2013-07-24 MED ORDER — PREDNISONE 10 MG PO TABS: ORAL_TABLET | ORAL | Status: DC

## 2013-07-24 NOTE — Patient Instructions (Addendum)
Prednisone 10 mg take  4 each am x 2 days,   2 each am x 2 days,  1 each am x 2 days and stop   Protonix (pantoprazole) 40 mg (or  nexium 20 x 2)  Take 30- 60 min before your first and last meals of the day until the cough / swallowing problem gone  GERD (REFLUX)  is an extremely common cause of respiratory symptoms, many times with no significant heartburn at all.   It can be treated with medication, but also with lifestyle changes including avoidance of late meals, excessive alcohol, smoking cessation, and avoid fatty foods, chocolate, peppermint, colas, red wine, and acidic juices such as orange juice.  NO MINT OR MENTHOL PRODUCTS SO NO COUGH DROPS  USE SUGARLESS CANDY INSTEAD (jolley ranchers or Stover's)  NO OIL BASED VITAMINS - use powdered substitutes.  For cough tessilon pearls 200 mg one every 4 h if needed   If not better in 4 weeks we need to see you back here.

## 2013-07-24 NOTE — Progress Notes (Addendum)
   Subjective:    Patient ID: Gregg [name], male    DOB: 1941/04/09  MRN: [id number]  HPI  19 yobm quit smoking 1985 with no prev problems until onset of cough around aug 2013 and waxed and waned until Nov 2014 so self referred 07/24/2013 for persistent severe cough  07/24/2013 1st [facility] office visit/ Jakara Blatter cc recurrent cough x 1.5 y Chief Complaint  Patient presents with  . Pulmonary Consult    Self referral. Pt c/o cough for "quite a while", but has been worse since Nov 2014. Cough is mainly mon prod and worse at night and when he gets hot.   worst cough is p supper occ wakes him up and more congested in am  On cozar since   Jul 03 2013 when changed from ACEi by Dr Clarise Cruz and not much better since Mucus is white, maybe a tbsp total on a bad day, assoc with overt HB No better on otc's to date including delsym  No obvious other patterns in day to day or daytime variabilty or assoc sob or cp or chest tightness, subjective wheeze overt sinus or hb symptoms. No unusual exp hx or h/o childhood pna/ asthma or knowledge of premature birth.  Usually Sleeping ok without nocturnal  or early am exacerbation  of respiratory  c/o's or need for noct saba. Also denies any obvious fluctuation of symptoms with weather or environmental changes or other aggravating or alleviating factors except as outlined above   Current Medications, Allergies, Complete Past Medical History, Past Surgical History, Family History, and Social History were reviewed in [employer] record.     Review of Systems  Constitutional: Positive for appetite change. Negative for fever, chills, activity change and unexpected weight change.  HENT: Positive for congestion, sneezing and trouble swallowing. Negative for dental problem, postnasal drip, rhinorrhea, sore throat and voice change.   Eyes: Negative for visual disturbance.  Respiratory: Positive for cough. Negative for choking and shortness of  breath.   Cardiovascular: Negative for chest pain and leg swelling.  Gastrointestinal: Negative for nausea, vomiting and abdominal pain.  Genitourinary: Negative for difficulty urinating.       Heartburn Indigestion  Musculoskeletal: Positive for arthralgias.  Skin: Negative for rash.  Psychiatric/Behavioral: Negative for behavioral problems and confusion.       Objective:   Physical Exam   Wt Readings from Last 3 Encounters:  07/24/13 190 lb (86.183 kg)  07/21/13 180 lb (81.647 kg)  07/03/13 187 lb (84.823 kg)      HEENT: nl dentition, turbinates, and orophanx. Nl external ear canals without cough reflex   NECK :  without JVD/Nodes/TM/ nl carotid upstrokes bilaterally   LUNGS: no acc muscle use, clear to A and P bilaterally without cough on insp or exp maneuvers   CV:  RRR  no s3 or murmur or increase in P2, no edema   ABD:  soft and nontender with nl excursion in the supine position. No bruits or organomegaly, bowel sounds nl  MS:  warm without deformities, calf tenderness, cyanosis or clubbing  SKIN: warm and dry without lesions    NEURO:  alert, approp, no deficits      cxr 07/21/13  Central venous catheter tip projects over the superior cavoatrial  junction.  No acute cardiopulmonary process.      Assessment & Plan:

## 2013-07-26 NOTE — Assessment & Plan Note (Signed)
The most common causes of chronic cough in immunocompetent adults include the following: upper airway cough syndrome (UACS), previously referred to as postnasal drip syndrome (PNDS), which is caused by variety of rhinosinus conditions; (2) asthma; (3) GERD; (4) chronic bronchitis from cigarette smoking or other inhaled environmental irritants; (5) nonasthmatic eosinophilic bronchitis; and (6) bronchiectasis.   These conditions, singly or in combination, have accounted for up to 94% of the causes of chronic cough in prospective studies.   Other conditions have constituted no >6% of the causes in prospective studies These have included bronchogenic carcinoma, chronic interstitial pneumonia, sarcoidosis, left ventricular failure, ACEI-induced cough, and aspiration from a condition associated with pharyngeal dysfunction.    Chronic cough is often simultaneously caused by more than one condition. A single cause has been found from 38 to 82% of the time, multiple causes from 18 to 62%. Multiply caused cough has been the result of three diseases up to 42% of the time.       Most likely this is  Classic Upper airway cough syndrome, so named because it's frequently impossible to sort out how much is  CR/sinusitis with freq throat clearing (which can be related to primary GERD)   vs  causing  secondary (" extra esophageal")  GERD from wide swings in gastric pressure that occur with throat clearing, often  promoting self use of mint and menthol lozenges that reduce the lower esophageal sphincter tone and exacerbate the problem further in a cyclical fashion.   These are the same pts (now being labeled as having "irritable larynx syndrome" by some cough centers) who not infrequently have a history of having failed to tolerate ace inhibitors,  dry powder inhalers or biphosphonates or report having atypical reflux symptoms that don't respond to standard doses of PPI , and are easily confused as having aecopd or asthma  flares by even experienced allergists/ pulmonologists.   rec maintain off acei and   max gerd rx with ppi/diet > See instructions for specific recommendations which were reviewed directly with the patient who was given a copy with highlighter outlining the key components.

## 2013-07-26 NOTE — Assessment & Plan Note (Signed)
Unfortunately For reasons not clear to me, losartan in the generic form has been reported now from mulitple sources, the most recent of which = Journal of Immunology, to cause the same pattern of upper airway symptoms as seen with acei.   This has not been reported with exposure to the other ARB's to date, so it seems reasonable to consider if the cough continues  either generic diovan or avapro if ARB needed.

## 2013-08-03 ENCOUNTER — Encounter: Payer: Self-pay | Admitting: Internal Medicine

## 2013-08-04 ENCOUNTER — Encounter: Payer: Self-pay | Admitting: Internal Medicine

## 2013-08-10 ENCOUNTER — Encounter: Payer: Self-pay | Admitting: Internal Medicine

## 2013-08-10 ENCOUNTER — Ambulatory Visit (INDEPENDENT_AMBULATORY_CARE_PROVIDER_SITE_OTHER): Payer: Medicare Other | Admitting: Emergency Medicine

## 2013-08-10 ENCOUNTER — Encounter: Payer: Self-pay | Admitting: Emergency Medicine

## 2013-08-10 VITALS — BP 108/60 | HR 64 | Temp 98.5°F | Ht 70.0 in | Wt 191.6 lb

## 2013-08-10 DIAGNOSIS — R059 Cough, unspecified: Secondary | ICD-10-CM

## 2013-08-10 DIAGNOSIS — R05 Cough: Secondary | ICD-10-CM

## 2013-08-10 MED ORDER — PREDNISONE 20 MG PO TABS: 40.0000 mg | ORAL_TABLET | Freq: Every day | ORAL | Status: DC

## 2013-08-10 NOTE — Progress Notes (Signed)
Subjective:    Patient ID: Gregg [name], male    DOB: 04/06/1941  MRN: [id number]  HPI [age] yobm quit smoking 1985 with no prev problems until onset of cough around aug 2013 and waxed and waned until Nov 2014 so self referred for persistent severe cough  1st [facility] office visit/ Wert cc recurrent cough x 1.5 y 07/24/13 Chief Complaint  Patient presents with  . Acute Visit    Pt c/o cough not improving at all since initial visit. Pt states still coughing until the point of vomiting.  He states coughs so violently at night he can not sleep.   worst cough is p supper occ wakes him up and more congested in am  On cozar since   Jul 03 2013 when changed from ACEi by Dr Clarise CruzBenshimon and not much better since Mucus is white, maybe a tbsp total on a bad day, assoc with overt HB No better on otc's to date including delsym  No obvious other patterns in day to day or daytime variabilty or assoc sob or cp or chest tightness, subjective wheeze overt sinus or hb symptoms. No unusual exp hx or h/o childhood pna/ asthma or knowledge of premature birth.  Usually Sleeping ok without nocturnal  or early am exacerbation  of respiratory  c/o's or need for noct saba. Also denies any obvious fluctuation of symptoms with weather or environmental changes or other aggravating or alleviating factors except as outlined above   Current Medications, Allergies, Complete Past Medical History, Past Surgical History, Family History, and Social History were reviewed in [facility] electronic medical record.  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 Seen by Dr Sherene SiresWert, was started on protonix and given pred taper. His wife says he may have benefited from the prednisone. He was already on protonix. He is coughing most at night. He is having nasal gtt. Frequent throat clearing.  He is on zyrtec.      Review of Systems  Constitutional: Positive for appetite change. Negative  for fever, chills, activity change and unexpected weight change.  HENT: Positive for congestion, sneezing and trouble swallowing. Negative for dental problem, postnasal drip, rhinorrhea, sore throat and voice change.   Eyes: Negative for visual disturbance.  Respiratory: Positive for cough. Negative for choking and shortness of breath.   Cardiovascular: Negative for chest pain and leg swelling.  Gastrointestinal: Negative for nausea, vomiting and abdominal pain.  Genitourinary: Negative for difficulty urinating.       Heartburn Indigestion  Musculoskeletal: Positive for arthralgias.  Skin: Negative for rash.  Psychiatric/Behavioral: Negative for behavioral problems and confusion.       Objective:   Physical Exam   Wt Readings from Last 3 Encounters:  08/10/13 191 lb 9.6 oz (86.909 kg)  07/24/13 190 lb (86.183 kg)  07/21/13 180 lb (81.647 kg)      HEENT: nl dentition, turbinates, and orophanx. Nl external ear canals without cough reflex   NECK :  without JVD/Nodes/TM/ nl carotid upstrokes bilaterally   LUNGS: no acc muscle use, clear to A and P bilaterally without cough on insp or exp maneuvers   CV:  RRR  no s3 or murmur or increase in P2, no edema   ABD:  soft and nontender with nl excursion in the supine position. No bruits or organomegaly, bowel sounds nl  MS:  warm without deformities, calf tenderness, cyanosis or clubbing  SKIN: warm and dry without lesions  NEURO:  alert, approp, no deficits      cxr 07/21/13  Central venous catheter tip projects over the superior cavoatrial  junction.  No acute cardiopulmonary process.      Assessment & Plan:

## 2013-08-10 NOTE — Patient Instructions (Signed)
Continue  protonix daily Continue  zyrtec daily Start Nasonex 2 sprays each nostril 1 hour before bedtime Try to stop throat clearing We will perform pulmonary function tests to review when you follow with Dr Sherene Sires Follow with Dr Sherene Sires in 1 month

## 2013-08-12 IMAGING — CR DG CHEST 1V PORT
1 series · 1 of 1 positions shown · non-contrast
Comparison: Yesterday

CLINICAL DATA: Swan-Ganz catheter placement

PORTABLE CHEST - 1 VIEW

[AP]
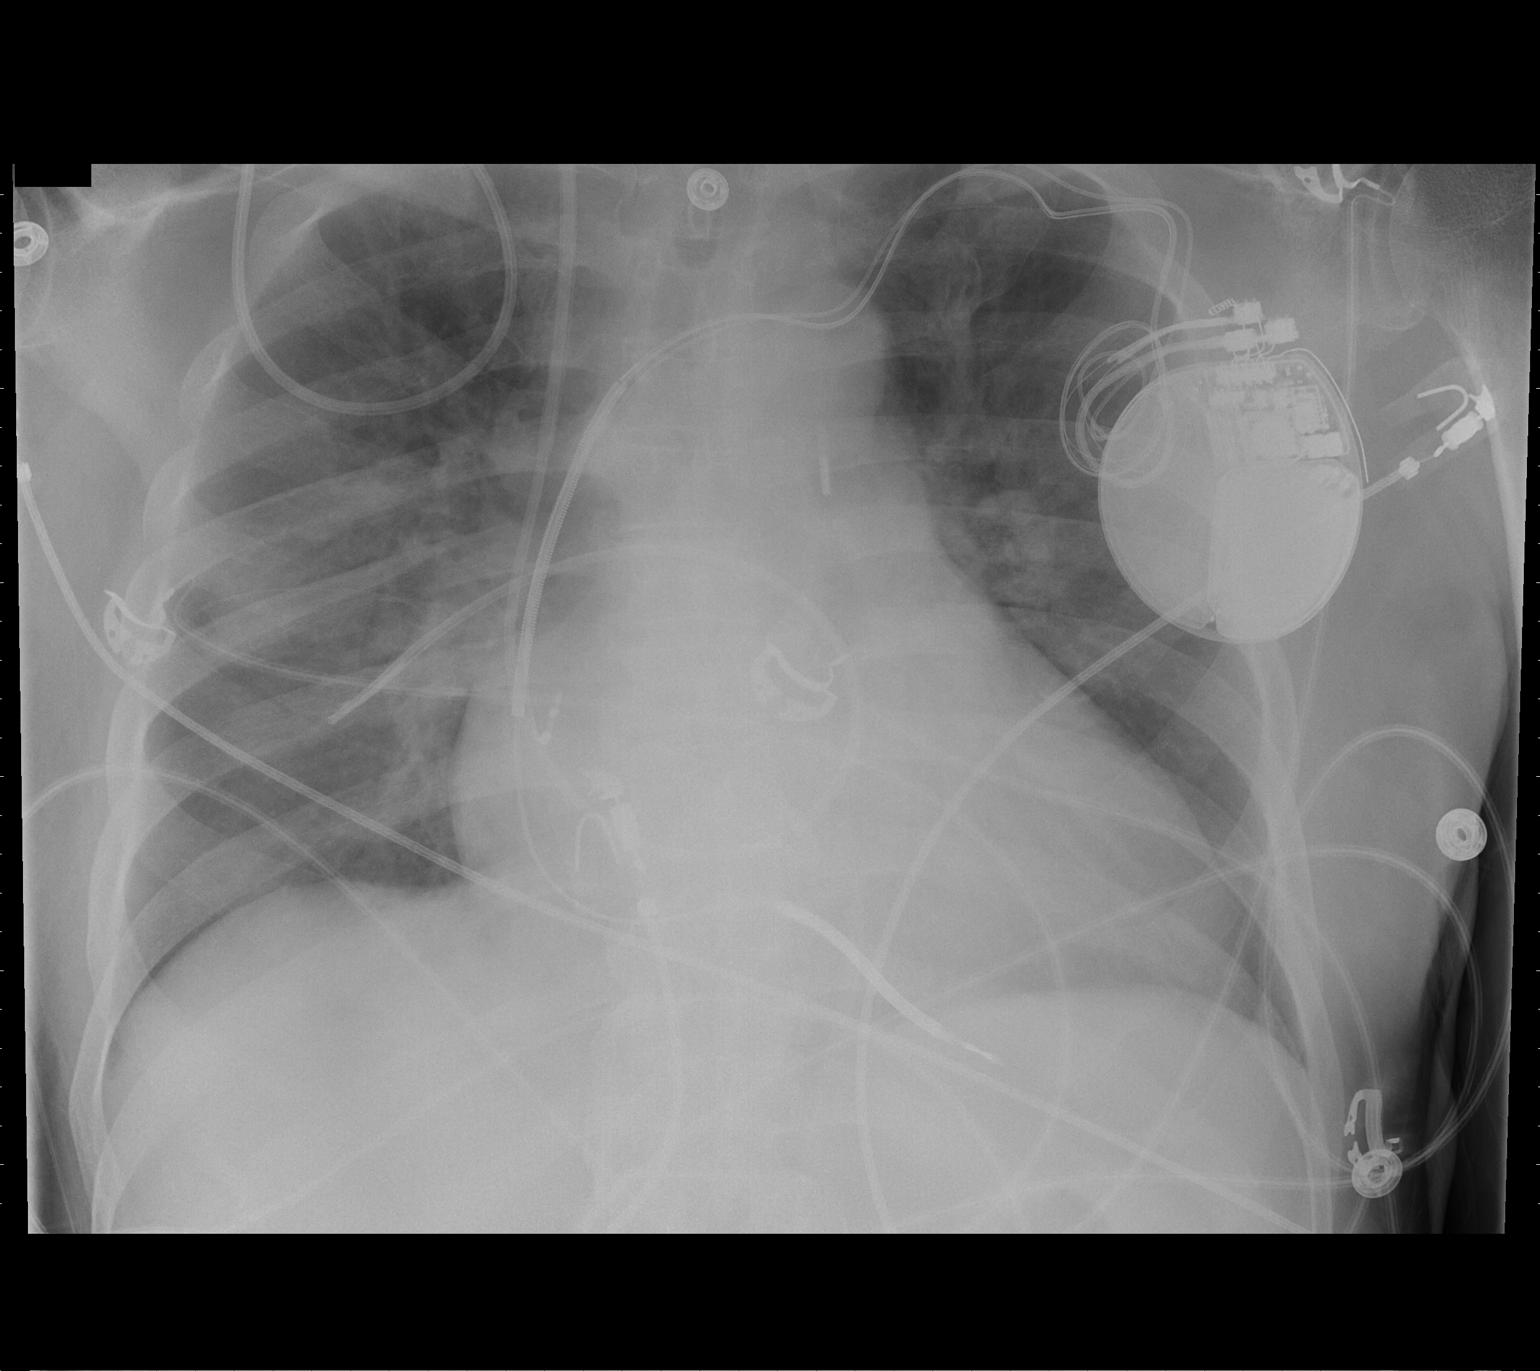

[1 of 1 positions shown; findings below may reference images not displayed]

FINDINGS: Swan-Ganz catheter tip has been advanced and the tip
projects over the right lower lobe.  Aortic balloon pump tip is in
the proximal descending aorta.  Stable AICD device, cardiomegaly.
Vascular congestion improved.
IMPRESSION: Aortic balloon pump placed.

Swan-Ganz catheter should be retracted.

Improved vascular congestion.

## 2013-08-16 DIAGNOSIS — I4891 Unspecified atrial fibrillation: Secondary | ICD-10-CM

## 2013-08-16 DIAGNOSIS — J189 Pneumonia, unspecified organism: Secondary | ICD-10-CM

## 2013-08-16 DIAGNOSIS — I5023 Acute on chronic systolic (congestive) heart failure: Secondary | ICD-10-CM

## 2013-08-16 DIAGNOSIS — I428 Other cardiomyopathies: Secondary | ICD-10-CM

## 2013-08-16 DIAGNOSIS — Z79899 Other long term (current) drug therapy: Secondary | ICD-10-CM

## 2013-08-16 DIAGNOSIS — Z452 Encounter for adjustment and management of vascular access device: Secondary | ICD-10-CM

## 2013-08-16 HISTORY — DX: Pneumonia, unspecified organism: J18.9

## 2013-08-17 ENCOUNTER — Telehealth (HOSPITAL_COMMUNITY): Payer: Self-pay | Admitting: Cardiology

## 2013-08-17 DIAGNOSIS — I5022 Chronic systolic (congestive) heart failure: Secondary | ICD-10-CM

## 2013-08-17 NOTE — Telephone Encounter (Signed)
Spoke w/Pam, RN she states they are a little concerned about PICC site, they feel it may need to be replaced but would like to evaluate it first, since pt lives in [place], will sch PICC exchange for tomorrow and just have pt come to clinic 1st for IV team to assess and can cx if needed

## 2013-08-17 NOTE — Telephone Encounter (Signed)
PICC line scheduled for exchange on 2/3 at 2 pm with a 1:30 arrival, pt's wife aware to have pt here at 12:30 so IV team can evaluate prior to that

## 2013-08-17 NOTE — Telephone Encounter (Signed)
Pt/pts wife called with concerns of redness and swelling at PICC line site Pt had nurse come to home and mild redness and selling is confirmed at site, mild drainage at site Dressing changed. Nurse states she would feel better if someone would look at it Medication is still infusing correctly

## 2013-08-17 NOTE — Telephone Encounter (Signed)
Have spoken with Elita Quick, RN with [facility] she will check with nurse and call me back, may just need to replace PICC

## 2013-08-18 ENCOUNTER — Ambulatory Visit (HOSPITAL_COMMUNITY)
Admission: RE | Admit: 2013-08-18 | Discharge: 2013-08-18 | Disposition: A | Discharge status: Home / Self Care | Payer: Medicare Other | Source: Ambulatory Visit | Attending: Internal Medicine | Admitting: Internal Medicine

## 2013-08-18 ENCOUNTER — Ambulatory Visit (HOSPITAL_COMMUNITY): Admission: RE | Admit: 2013-08-18 | Payer: Medicare Other | Source: Ambulatory Visit

## 2013-08-19 ENCOUNTER — Other Ambulatory Visit (HOSPITAL_COMMUNITY): Payer: Self-pay

## 2013-08-19 MED ORDER — LOSARTAN POTASSIUM 25 MG PO TABS: 25.0000 mg | ORAL_TABLET | Freq: Every day | ORAL | Status: DC

## 2013-08-20 ENCOUNTER — Other Ambulatory Visit: Payer: Self-pay | Admitting: Internal Medicine

## 2013-09-02 ENCOUNTER — Inpatient Hospital Stay (HOSPITAL_COMMUNITY)
Admission: EM | Admit: 2013-09-02 | Discharge: 2013-09-14 | DRG: 291 | Disposition: A | Discharge status: Home Health | Payer: Medicare Other | Attending: Internal Medicine | Admitting: Internal Medicine

## 2013-09-02 ENCOUNTER — Encounter (HOSPITAL_COMMUNITY): Payer: Self-pay | Admitting: Emergency Medicine

## 2013-09-02 ENCOUNTER — Telehealth: Payer: Self-pay | Admitting: Internal Medicine

## 2013-09-02 ENCOUNTER — Emergency Department (HOSPITAL_COMMUNITY): Payer: Medicare Other

## 2013-09-02 DIAGNOSIS — D649 Anemia, unspecified: Secondary | ICD-10-CM | POA: Diagnosis present

## 2013-09-02 DIAGNOSIS — I509 Heart failure, unspecified: Secondary | ICD-10-CM | POA: Diagnosis present

## 2013-09-02 DIAGNOSIS — R197 Diarrhea, unspecified: Secondary | ICD-10-CM | POA: Diagnosis not present

## 2013-09-02 DIAGNOSIS — I4891 Unspecified atrial fibrillation: Secondary | ICD-10-CM | POA: Diagnosis present

## 2013-09-02 DIAGNOSIS — Z87891 Personal history of nicotine dependence: Secondary | ICD-10-CM

## 2013-09-02 DIAGNOSIS — K219 Gastro-esophageal reflux disease without esophagitis: Secondary | ICD-10-CM | POA: Diagnosis present

## 2013-09-02 DIAGNOSIS — N183 Chronic kidney disease, stage 3 unspecified: Secondary | ICD-10-CM | POA: Diagnosis present

## 2013-09-02 DIAGNOSIS — I5023 Acute on chronic systolic (congestive) heart failure: Principal | ICD-10-CM | POA: Diagnosis present

## 2013-09-02 DIAGNOSIS — R570 Cardiogenic shock: Secondary | ICD-10-CM

## 2013-09-02 DIAGNOSIS — Z95810 Presence of automatic (implantable) cardiac defibrillator: Secondary | ICD-10-CM

## 2013-09-02 DIAGNOSIS — I129 Hypertensive chronic kidney disease with stage 1 through stage 4 chronic kidney disease, or unspecified chronic kidney disease: Secondary | ICD-10-CM | POA: Diagnosis present

## 2013-09-02 DIAGNOSIS — W19XXXA Unspecified fall, initial encounter: Secondary | ICD-10-CM | POA: Diagnosis not present

## 2013-09-02 DIAGNOSIS — I5022 Chronic systolic (congestive) heart failure: Secondary | ICD-10-CM | POA: Diagnosis present

## 2013-09-02 DIAGNOSIS — J309 Allergic rhinitis, unspecified: Secondary | ICD-10-CM | POA: Diagnosis present

## 2013-09-02 DIAGNOSIS — I059 Rheumatic mitral valve disease, unspecified: Secondary | ICD-10-CM | POA: Diagnosis present

## 2013-09-02 DIAGNOSIS — J810 Acute pulmonary edema: Secondary | ICD-10-CM

## 2013-09-02 DIAGNOSIS — R05 Cough: Secondary | ICD-10-CM

## 2013-09-02 DIAGNOSIS — I2510 Atherosclerotic heart disease of native coronary artery without angina pectoris: Secondary | ICD-10-CM | POA: Diagnosis present

## 2013-09-02 DIAGNOSIS — J9600 Acute respiratory failure, unspecified whether with hypoxia or hypercapnia: Secondary | ICD-10-CM | POA: Diagnosis present

## 2013-09-02 DIAGNOSIS — R053 Chronic cough: Secondary | ICD-10-CM | POA: Diagnosis present

## 2013-09-02 DIAGNOSIS — I428 Other cardiomyopathies: Secondary | ICD-10-CM | POA: Diagnosis present

## 2013-09-02 DIAGNOSIS — Z7901 Long term (current) use of anticoagulants: Secondary | ICD-10-CM

## 2013-09-02 DIAGNOSIS — N179 Acute kidney failure, unspecified: Secondary | ICD-10-CM | POA: Diagnosis present

## 2013-09-02 DIAGNOSIS — G4733 Obstructive sleep apnea (adult) (pediatric): Secondary | ICD-10-CM | POA: Diagnosis present

## 2013-09-02 DIAGNOSIS — E876 Hypokalemia: Secondary | ICD-10-CM | POA: Diagnosis not present

## 2013-09-02 DIAGNOSIS — J189 Pneumonia, unspecified organism: Secondary | ICD-10-CM | POA: Diagnosis present

## 2013-09-02 DIAGNOSIS — Z66 Do not resuscitate: Secondary | ICD-10-CM | POA: Diagnosis present

## 2013-09-02 DIAGNOSIS — Z7982 Long term (current) use of aspirin: Secondary | ICD-10-CM

## 2013-09-02 DIAGNOSIS — G4730 Sleep apnea, unspecified: Secondary | ICD-10-CM | POA: Diagnosis present

## 2013-09-02 DIAGNOSIS — K5900 Constipation, unspecified: Secondary | ICD-10-CM | POA: Diagnosis present

## 2013-09-02 DIAGNOSIS — R791 Abnormal coagulation profile: Secondary | ICD-10-CM | POA: Diagnosis not present

## 2013-09-02 DIAGNOSIS — Z79899 Other long term (current) drug therapy: Secondary | ICD-10-CM

## 2013-09-02 HISTORY — DX: Pneumonia, unspecified organism: J18.9

## 2013-09-02 LAB — CBC WITH DIFFERENTIAL/PLATELET
BASOS ABS: 0 10*3/uL (ref 0.0–0.1)
Basophils Relative: 0 % (ref 0–1)
Eosinophils Absolute: 0.2 10*3/uL (ref 0.0–0.7)
Eosinophils Relative: 1 % (ref 0–5)
HCT: 36.1 % — ABNORMAL LOW (ref 39.0–52.0)
HEMOGLOBIN: 12.2 g/dL — AB (ref 13.0–17.0)
LYMPHS PCT: 13 % (ref 12–46)
Lymphs Abs: 1.8 10*3/uL (ref 0.7–4.0)
MCH: 31.3 pg (ref 26.0–34.0)
MCHC: 33.8 g/dL (ref 30.0–36.0)
MCV: 92.6 fL (ref 78.0–100.0)
MONO ABS: 1.8 10*3/uL — AB (ref 0.1–1.0)
MONOS PCT: 13 % — AB (ref 3–12)
NEUTROS ABS: 10.4 10*3/uL — AB (ref 1.7–7.7)
Neutrophils Relative %: 73 % (ref 43–77)
Platelets: 165 10*3/uL (ref 150–400)
RBC: 3.9 MIL/uL — ABNORMAL LOW (ref 4.22–5.81)
RDW: 15.4 % (ref 11.5–15.5)
WBC: 14.2 10*3/uL — AB (ref 4.0–10.5)

## 2013-09-02 LAB — BASIC METABOLIC PANEL
BUN: 18 mg/dL (ref 6–23)
CHLORIDE: 100 meq/L (ref 96–112)
CO2: 26 mEq/L (ref 19–32)
Calcium: 10.6 mg/dL — ABNORMAL HIGH (ref 8.4–10.5)
Creatinine, Ser: 1.37 mg/dL — ABNORMAL HIGH (ref 0.50–1.35)
GFR calc non Af Amer: 50 mL/min — ABNORMAL LOW (ref >90)
GFR, EST AFRICAN AMERICAN: 58 mL/min — AB (ref >90)
Glucose, Bld: 109 mg/dL — ABNORMAL HIGH (ref 70–99)
POTASSIUM: 3.9 meq/L (ref 3.7–5.3)
SODIUM: 140 meq/L (ref 137–147)

## 2013-09-02 LAB — DIGOXIN LEVEL: Digoxin Level: 1 ng/mL (ref 0.8–2.0)

## 2013-09-02 LAB — PROTIME-INR
INR: 1.64 — AB (ref 0.00–1.49)
Prothrombin Time: 19 seconds — ABNORMAL HIGH (ref 11.6–15.2)

## 2013-09-02 LAB — PRO B NATRIURETIC PEPTIDE: Pro B Natriuretic peptide (BNP): 5151 pg/mL — ABNORMAL HIGH (ref 0–125)

## 2013-09-02 MED ORDER — ASPIRIN EC 81 MG PO TBEC
81.0000 mg | DELAYED_RELEASE_TABLET | Freq: Every day | ORAL | Status: DC
  Administered 2013-09-03 – 2013-09-14 (×12): 81 mg via ORAL
  Filled 2013-09-02 (×12): qty 1

## 2013-09-02 MED ORDER — MAGNESIUM OXIDE 400 MG PO TABS
1000.0000 mg | ORAL_TABLET | Freq: Two times a day (BID) | ORAL | Status: DC
  Administered 2013-09-03 – 2013-09-14 (×22): 1000 mg via ORAL
  Filled 2013-09-02 (×24): qty 2.5

## 2013-09-02 MED ORDER — PIPERACILLIN-TAZOBACTAM 3.375 G IVPB 30 MIN
3.3750 g | Freq: Once | INTRAVENOUS | Status: AC
  Administered 2013-09-02: 3.375 g via INTRAVENOUS
  Filled 2013-09-02: qty 50

## 2013-09-02 MED ORDER — CARVEDILOL 3.125 MG PO TABS
3.1250 mg | ORAL_TABLET | Freq: Every day | ORAL | Status: DC
  Administered 2013-09-03 – 2013-09-04 (×2): 3.125 mg via ORAL
  Filled 2013-09-02 (×3): qty 1

## 2013-09-02 MED ORDER — LORATADINE 10 MG PO TABS
10.0000 mg | ORAL_TABLET | Freq: Every day | ORAL | Status: DC
  Administered 2013-09-03 – 2013-09-14 (×12): 10 mg via ORAL
  Filled 2013-09-02 (×12): qty 1

## 2013-09-02 MED ORDER — VANCOMYCIN HCL IN DEXTROSE 1-5 GM/200ML-% IV SOLN: 1000.0000 mg | Freq: Two times a day (BID) | INTRAVENOUS | Status: DC

## 2013-09-02 MED ORDER — VANCOMYCIN HCL 10 G IV SOLR
1500.0000 mg | Freq: Once | INTRAVENOUS | Status: AC
  Administered 2013-09-02: 1500 mg via INTRAVENOUS
  Filled 2013-09-02: qty 1500

## 2013-09-02 MED ORDER — DIGOXIN 125 MCG PO TABS
0.1250 mg | ORAL_TABLET | Freq: Every day | ORAL | Status: DC
  Administered 2013-09-03 – 2013-09-04 (×2): 0.125 mg via ORAL
  Filled 2013-09-02 (×2): qty 1

## 2013-09-02 MED ORDER — GUAIFENESIN ER 600 MG PO TB12
600.0000 mg | ORAL_TABLET | Freq: Two times a day (BID) | ORAL | Status: DC
  Administered 2013-09-03 – 2013-09-14 (×23): 600 mg via ORAL
  Filled 2013-09-02 (×26): qty 1

## 2013-09-02 MED ORDER — ACETAMINOPHEN 325 MG PO TABS: 650.0000 mg | ORAL_TABLET | Freq: Two times a day (BID) | ORAL | Status: DC | PRN

## 2013-09-02 MED ORDER — PROMETHAZINE HCL 25 MG/ML IJ SOLN: 12.5000 mg | Freq: Four times a day (QID) | INTRAMUSCULAR | Status: DC | PRN

## 2013-09-02 MED ORDER — AZITHROMYCIN 500 MG PO TABS
500.0000 mg | ORAL_TABLET | Freq: Every day | ORAL | Status: AC
  Administered 2013-09-03: 500 mg via ORAL
  Filled 2013-09-02: qty 1

## 2013-09-02 MED ORDER — GUAIFENESIN 100 MG/5ML PO SYRP
200.0000 mg | ORAL_SOLUTION | Freq: Once | ORAL | Status: AC
  Administered 2013-09-02: 200 mg via ORAL
  Filled 2013-09-02: qty 10

## 2013-09-02 MED ORDER — AZITHROMYCIN 250 MG PO TABS
250.0000 mg | ORAL_TABLET | Freq: Every day | ORAL | Status: DC
  Filled 2013-09-02: qty 1

## 2013-09-02 MED ORDER — ALUM & MAG HYDROXIDE-SIMETH 200-200-20 MG/5ML PO SUSP
30.0000 mL | Freq: Four times a day (QID) | ORAL | Status: DC | PRN
  Filled 2013-09-02: qty 30

## 2013-09-02 MED ORDER — FERROCITE PLUS 106-1 MG PO TABS: 1.0000 | ORAL_TABLET | Freq: Every day | ORAL | Status: DC

## 2013-09-02 MED ORDER — WARFARIN SODIUM 7.5 MG PO TABS
7.5000 mg | ORAL_TABLET | Freq: Once | ORAL | Status: DC
  Filled 2013-09-02: qty 1

## 2013-09-02 MED ORDER — HYDROCODONE-HOMATROPINE 5-1.5 MG/5ML PO SYRP
5.0000 mL | ORAL_SOLUTION | Freq: Once | ORAL | Status: AC
  Administered 2013-09-02: 5 mL via ORAL
  Filled 2013-09-02: qty 5

## 2013-09-02 MED ORDER — ACETAMINOPHEN ER 650 MG PO TBCR: 650.0000 mg | EXTENDED_RELEASE_TABLET | Freq: Two times a day (BID) | ORAL | Status: DC | PRN

## 2013-09-02 MED ORDER — FE FUMARATE-B12-VIT C-FA-IFC PO CAPS
1.0000 | ORAL_CAPSULE | Freq: Every day | ORAL | Status: DC
  Administered 2013-09-03 – 2013-09-14 (×12): 1 via ORAL
  Filled 2013-09-02 (×12): qty 1

## 2013-09-02 MED ORDER — BENZONATATE 100 MG PO CAPS
200.0000 mg | ORAL_CAPSULE | Freq: Three times a day (TID) | ORAL | Status: DC
  Administered 2013-09-03 – 2013-09-14 (×36): 200 mg via ORAL
  Filled 2013-09-02 (×39): qty 2

## 2013-09-02 MED ORDER — WARFARIN - PHARMACIST DOSING INPATIENT
Freq: Every day | Status: DC
  Administered 2013-09-12 – 2013-09-13 (×2)

## 2013-09-02 MED ORDER — FUROSEMIDE 40 MG PO TABS
40.0000 mg | ORAL_TABLET | Freq: Two times a day (BID) | ORAL | Status: DC
  Administered 2013-09-03 (×2): 40 mg via ORAL
  Filled 2013-09-02 (×5): qty 1

## 2013-09-02 MED ORDER — ALBUTEROL SULFATE (2.5 MG/3ML) 0.083% IN NEBU
2.5000 mg | INHALATION_SOLUTION | Freq: Four times a day (QID) | RESPIRATORY_TRACT | Status: DC | PRN
  Administered 2013-09-03: 2.5 mg via RESPIRATORY_TRACT
  Filled 2013-09-02: qty 3

## 2013-09-02 MED ORDER — SILDENAFIL CITRATE 20 MG PO TABS
20.0000 mg | ORAL_TABLET | Freq: Three times a day (TID) | ORAL | Status: DC
  Administered 2013-09-03 – 2013-09-14 (×36): 20 mg via ORAL
  Filled 2013-09-02 (×38): qty 1

## 2013-09-02 MED ORDER — AMIODARONE HCL 200 MG PO TABS
200.0000 mg | ORAL_TABLET | Freq: Every day | ORAL | Status: DC
  Administered 2013-09-03 – 2013-09-14 (×12): 200 mg via ORAL
  Filled 2013-09-02 (×12): qty 1

## 2013-09-02 MED ORDER — PIPERACILLIN-TAZOBACTAM 3.375 G IVPB: 3.3750 g | Freq: Once | INTRAVENOUS | Status: DC

## 2013-09-02 MED ORDER — VITAMIN B-6 100 MG PO TABS
100.0000 mg | ORAL_TABLET | Freq: Every day | ORAL | Status: DC
  Administered 2013-09-03 – 2013-09-14 (×12): 100 mg via ORAL
  Filled 2013-09-02 (×12): qty 1

## 2013-09-02 MED ORDER — DEXTROSE 5 % IV SOLN
1.0000 g | INTRAVENOUS | Status: DC
  Administered 2013-09-03 – 2013-09-04 (×2): 1 g via INTRAVENOUS
  Filled 2013-09-02 (×4): qty 10

## 2013-09-02 MED ORDER — LOSARTAN POTASSIUM 25 MG PO TABS
25.0000 mg | ORAL_TABLET | Freq: Every day | ORAL | Status: DC
  Administered 2013-09-03: 25 mg via ORAL
  Filled 2013-09-02: qty 1

## 2013-09-02 MED ORDER — PIPERACILLIN-TAZOBACTAM 3.375 G IVPB: 3.3750 g | Freq: Three times a day (TID) | INTRAVENOUS | Status: DC

## 2013-09-02 MED ORDER — DOCUSATE SODIUM 100 MG PO CAPS
100.0000 mg | ORAL_CAPSULE | Freq: Every day | ORAL | Status: DC
  Filled 2013-09-02 (×2): qty 1

## 2013-09-02 MED ORDER — PANTOPRAZOLE SODIUM 40 MG IV SOLR
40.0000 mg | Freq: Every day | INTRAVENOUS | Status: DC
  Administered 2013-09-03: 40 mg via INTRAVENOUS
  Filled 2013-09-02 (×2): qty 40

## 2013-09-02 MED ORDER — ALBUTEROL SULFATE HFA 108 (90 BASE) MCG/ACT IN AERS
2.0000 | INHALATION_SPRAY | Freq: Once | RESPIRATORY_TRACT | Status: AC
  Administered 2013-09-02: 2 via RESPIRATORY_TRACT
  Filled 2013-09-02: qty 6.7

## 2013-09-02 MED ORDER — SODIUM CHLORIDE 0.9 % IJ SOLN
3.0000 mL | Freq: Two times a day (BID) | INTRAMUSCULAR | Status: DC
  Administered 2013-09-03 – 2013-09-09 (×12): 3 mL via INTRAVENOUS
  Administered 2013-09-09: 10 mL via INTRAVENOUS
  Administered 2013-09-10 – 2013-09-13 (×3): 3 mL via INTRAVENOUS

## 2013-09-02 MED ORDER — SENNA 8.6 MG PO TABS
1.0000 | ORAL_TABLET | Freq: Every day | ORAL | Status: DC
  Filled 2013-09-02 (×2): qty 1

## 2013-09-02 MED ORDER — SODIUM CHLORIDE 0.9 % IV SOLN: 19.5000 ug/min | INTRAVENOUS | Status: DC

## 2013-09-02 NOTE — Telephone Encounter (Signed)
lmolmtcb x1 for pt

## 2013-09-02 NOTE — Telephone Encounter (Signed)
Pt returning nurses call call her @ [phone] call asap.Caren Griffins

## 2013-09-02 NOTE — ED Notes (Signed)
Admitting physicians at bedside.

## 2013-09-02 NOTE — H&P (Signed)
Date: 09/02/2013               Patient Name:  Gregg [name] MRN: [id number]  DOB: [birth year] Age / Sex: 73 y.o., male   PCP: Normand Sloop, MD         Medical Service: Internal Medicine Teaching Service         Attending Physician: Dr. Scharlene Gloss, MD    First Contact: Dr. Joni Reining Pager: [phone]  Second Contact: Dr. Jessee Avers Pager: [phone]       After Hours (After 5p/  First Contact Pager: [phone]  weekends / holidays): Second Contact Pager: [phone]   Chief Complaint: worsening dry cough with vomiting   History of Present Illness:   Gregg [name] is a 73 year old man with past medical history of CHF (EF 10-15%, 09/2012) with ICD in place, on chronic outpatient milrinone, CKD Stage III, afib on coumadin, OSA on CPAP, chronic 3 month cough and past tobacco abuse who  presents with worsening cough. The patient notes a 2-week history of worsening cough, rarely productive of scant clear mucous, worse at night (awakening patient from sleep) though also present throughout the day. At symptom onset 2 weeks ago, he noted associated rhinorrhea, "sinus headache", and a sensation of post-nasal drip, though these associated symptoms have now improved. He notes some muscle and joint pains associated with vigorous coughing, and post-tussive emesis for the last 1-2 weeks, but no sick contacts, fevers, dyspnea, sinus tenderness, or chest pain. He has a history of CHF, but notes no weight gain, LE edema, change in his baseline 2-pillow orthopnea, or PND. He notes compliance with home CPAP.   Chart review reveals that the patient has had a chronic cough, waxing and waning between Aug 2013 - Nov 2014, though notably worse since Nov 2014. He has been evaluated by Pulmonology for the last 1 month for work-up of this issue, thought to be upper airway cough syndrome vs GERD, among other possibilities. Symptoms improved slightly with a short course of prednisone 08/10/13, and He has been using Zyrtec,  albuterol inhaler, and PPI. He received influenza vaccibne    Meds: Current Facility-Administered Medications  Medication Dose Route Frequency Provider Last Rate Last Dose  . vancomycin (VANCOCIN) 1,500 mg in sodium chloride 0.9 % 500 mL IVPB  1,500 mg Intravenous Once Sinda Du, MD 250 mL/hr at 09/02/13 1840 1,500 mg at 09/02/13 1840   Current Outpatient Prescriptions  Medication Sig Dispense Refill  . acetaminophen (TYLENOL) 650 MG CR tablet Take 650 mg by mouth 2 (two) times daily as needed for pain.      Marland Kitchen amiodarone (PACERONE) 400 MG tablet Take 200 mg by mouth daily.      Marland Kitchen aspirin EC 81 MG tablet Take 81 mg by mouth daily.      . B Complex-Biotin-FA (HM VITAMIN B100 COMPLEX PO) Take 1,000 mg by mouth daily with lunch.      . carvedilol (COREG) 6.25 MG tablet Take 3.125 mg by mouth at bedtime.      . cetirizine (ZYRTEC) 10 MG tablet Take 10 mg by mouth daily.      . digoxin (LANOXIN) 0.125 MG tablet Take 1 tablet (0.125 mg total) by mouth daily.  90 tablet  3  . docusate sodium (DULCOLAX) 100 MG capsule Take 100 mg by mouth at bedtime.      . Fe Fum-FA-B Cmp-C-Zn-Mg-Mn-Cu (FERROCITE PLUS PO) Take 1 tablet by mouth daily with lunch.       Marland Kitchen  furosemide (LASIX) 40 MG tablet Take 40 mg by mouth 2 (two) times daily.      . hydrocortisone cream 1 % Apply 1 application topically 2 (two) times daily as needed (for itching or tape irritation).       Marland Kitchen losartan (COZAAR) 25 MG tablet Take 1 tablet (25 mg total) by mouth daily.  90 tablet  3  . magnesium oxide (MAG-OX) 400 MG tablet Take 800-1,200 mg by mouth daily. 2 tablets (800 mg) every morning and 3 tablets (1200 mg) at lunch      . Multiple Vitamins-Minerals (MULTIVITAMIN WITH MINERALS) tablet Take 1 tablet by mouth daily with lunch.       . pyridOXINE (VITAMIN B-6) 100 MG tablet Take 100 mg by mouth daily with lunch.       . senna (SENOKOT) 8.6 MG TABS Take 1 tablet by mouth at bedtime.       . sildenafil (REVATIO) 20 MG tablet Take 1  tablet (20 mg total) by mouth 3 (three) times daily.  90 tablet  12  . sodium chloride 0.9 % SOLN with milrinone 1 MG/ML SOLN 200 mcg/mL Inject into the vein continuous.      Marland Kitchen warfarin (COUMADIN) 5 MG tablet Take 5-7.5 mg by mouth daily. 1 1/2 tablets (7.81m) Tuesdays and Fridays, 1 tablet (5 mg) on Sunday, Monday, Wednesday, Thursday, Saturday       Facility-Administered Medications Ordered in Other Encounters  Medication Dose Route Frequency Provider Last Rate Last Dose  . 0.9 %  sodium chloride infusion    Continuous [facility]N [facility] C[facility]        Allergies: Allergies as of 09/02/2013  . (No Known Allergies)   Past Medical History  Diagnosis Date  . Dysrhythmia   . ICD (implantable cardiac defibrillator) in place   . GERD (gastroesophageal reflux disease)   . Chronic kidney disease   . Anemia   . Pacemaker   . CHF (congestive heart failure)   . Sleep apnea     uses CPAP   Past Surgical History  Procedure Laterality Date  . Insert / replace / remove pacemaker    . Colon surgery      ruptured colon  . Tee without cardioversion N/A 09/16/2012    Procedure: TRANSESOPHAGEAL ECHOCARDIOGRAM (TEE);  Surgeon: DJolaine Artist MD;  [facility];  Service: Cardiovascular;  Laterality: N/A;  . Cardioversion N/A 09/16/2012    Procedure: CARDIOVERSION;  Surgeon: DJolaine Artist MD;  [facility];  Service: Cardiovascular;  Laterality: N/A;   Family History  Problem Relation Age of Onset  . Allergies Sister   . Heart disease Brother    History   Social History  . Marital Status: Married    Spouse Name: N/A    Number of Children: N/A  . Years of Education: N/A   Occupational History  . Retired    Social History Main Topics  . Smoking status: Former Smoker -- 1.00 packs/day for 20 years    Types: Cigarettes    Quit date: 07/17/1983  . Smokeless tobacco: Never Used  . Alcohol Use: No  . Drug Use: No  . Sexual Activity: Yes   Other Topics Concern    . Not on file   Social History Narrative  . No narrative on file    Review of Systems: Review of Systems   Review of Systems  Constitutional: Positive for malaise/fatigue. Negative for fever, chills, weight loss and diaphoresis.  Decreased PO intake  HENT: Positive for congestion. Negative for ear pain and sore throat.        PND, rhinorrhea  Eyes: Negative for blurred vision.  Respiratory: Positive for cough. Negative for hemoptysis, sputum production, shortness of breath and wheezing.   Cardiovascular: Negative for chest pain, palpitations, orthopnea, leg swelling and PND.  Gastrointestinal: Positive for nausea, vomiting (after coughing  for past 1-2 weeks), abdominal pain (with coughing and eating) and constipation (chronic). Negative for heartburn, diarrhea, blood in stool and melena.  Genitourinary: Negative for dysuria, urgency, frequency and hematuria.  Musculoskeletal: Positive for back pain (with coughing), joint pain (knees ), myalgias and neck pain (with coughing).  Skin: Negative for rash.  Neurological: Negative for dizziness, sensory change, weakness and headaches.  Endo/Heme/Allergies: Does not bruise/bleed easily.  Psychiatric/Behavioral: Negative for substance abuse.    Physical Exam: Blood pressure 91/71, pulse 91, temperature 98.4 F (36.9 C), temperature source Oral, resp. rate 22, SpO2 97.00%. Physical Exam  Constitutional: He is oriented to person, place, and time. He appears well-developed and well-nourished. He appears distressed (coughing and then vomiting ).  HENT:  Head: Normocephalic and atraumatic.  Right Ear: External ear normal.  Left Ear: External ear normal.  Nose: Nose normal.  Mouth/Throat: Oropharynx is clear and moist. No oropharyngeal exudate.  Eyes: Conjunctivae and EOM are normal. Pupils are equal, round, and reactive to light. Right eye exhibits no discharge. Left eye exhibits no discharge. No scleral icterus.  Neck: Normal range  of motion. Neck supple.  Cardiovascular: Normal rate, regular rhythm and normal heart sounds.   Pulmonary/Chest: No respiratory distress. He has no wheezes. He exhibits no tenderness.  Decreased breath sounds with coarse sound at bases  Abdominal: Soft. Bowel sounds are normal. He exhibits no distension. There is no tenderness. There is no rebound and no guarding.  Musculoskeletal: Normal range of motion. He exhibits no edema and no tenderness.  Lymphadenopathy:    He has no cervical adenopathy.  Neurological: He is alert and oriented to person, place, and time. No cranial nerve deficit.  Skin: Skin is warm and dry. No rash noted. He is not diaphoretic. No erythema. No pallor.  Psychiatric: He has a normal mood and affect. His behavior is normal. Thought content normal.     Lab results: Basic Metabolic Panel:  Recent Labs  09/02/13 1409  NA 140  K 3.9  CL 100  CO2 26  GLUCOSE 109*  BUN 18  CREATININE 1.37*  CALCIUM 10.6*   CBC:  Recent Labs  09/02/13 1409  WBC 14.2*  NEUTROABS 10.4*  HGB 12.2*  HCT 36.1*  MCV 92.6  PLT 165   BNP:  Recent Labs  09/02/13 1409  PROBNP 5151.0*   Coagulation:  Recent Labs  09/02/13 1750  LABPROT 19.0*  INR 1.64*    Imaging results:  Dg Chest 2 View  09/02/2013   CLINICAL DATA:  Cough and emesis.  Joint pain  EXAM: CHEST  2 VIEW  COMPARISON:  07/21/2013  FINDINGS: Stable appearance of dual-chamber left approach ICD/pacer. Right IJ central venous catheter is in stable position. Cardiomegaly which is also stable. New perihilar airspace disease, most dense on the right. No effusion or interstitial coarsening. No pneumothorax. No acute osseous findings.  IMPRESSION: Perihilar airspace disease is concerning for pneumonia given the focal appearance. Pulmonary edema can also have this appearance.   Electronically Signed   By: Jorje Guild M.D.   On: 09/02/2013 14:13    Other results: EKG:  HR 91, sinus rhythm, prolonged PR interval  (275), nonspecific IVCD with LAD, borderline T abnormalities in lateral leads   Assessment: 73 year old with past medical history of CHF w/icd on milidrone, afib on coumadin, OSA on CPAP, allergic rhinitis, and GERD who presents with worsening cough for past month and post-tussive emesis for the past 1-2 weeks found to have leukocytosis and chest xray image findings suggestive of pneumonia.       Plan:  Acute on Chronic cough with post-tussive emesis most likely secondary to CAP - Pt presents with chronic dry for the past 3 months (hemoptysis on 07/21/13 ED visit) worse in the past month with recent post-tussive emesis for the past week without fever (howver later with T 101.5), dyspnea, or hypoxia but with leukocytosis (14.2 with neutrophilia) and chest-xray with possible pneumonia concerning for CAP. Pt with possible bacterial on viral respiratory infection considering cold symptoms 2 weeks ago and PND with sinus symptoms (currently without sinus tenderness). Also with history of allergic rhinitis controlled on anti-histamine and GERD controlled with PPI therapy. Etiology of chronic cough possible due to chronic bronchitis, asthma, post-nasal drip, chronic sinusitis, pulmonary edema, pertussis, ILD, atypical/aspiration PNA,  hypersensitivity, autoimmune disease (sarcoid, RA, SLE), bronchiectasis, or medication induced (ARB). Pt is a past smoker (1 pack daily x 20 yrs) with no personal or FH of pulmonmary disease. ESR on 06/2013 was normal. Pt failed antibiotic therapy  with cephalexin  (07/21/13 ED) and corticosteroid therapy (1/26). Albuterol use also did not produce relief. Pt's lisinopril was changed to losartan on 07/03/13 with no improvement in cough. Pt received influenza vaccination 06/2013.  Pt received IV vancomycin and zosyn for HCAP in ED (however receives milrinone via home health services).   -Obtain blood cultures x 2 (drawn after antibiotics started)  -Obtain respiratory panel and sputum  culture  -Obtain HIV Ab, urinary Strep. Pneumoniae and Legionella Ag's  -Consider pertussis serology and CT chest w/contrast and sinuses (if no improvement)   -Continue home cetirizine 10 mg daily -Consider ICS (flovent) and nasal corticosteroid (flonase) for inflammation -Consider alternative to losartan  -Administer albuterol nebulizer PRN Q 6 hrs for acute bronchospasm -Protonix 40 mg daily for acid reflux -Phenergan 12.5 mg Q 6 hr prn nausea/vomiting  -Mucinex 600 mg BID and tessalon 200 mg TID  for cough -IV 1 g ceftriaxone and azithromycin (z-pack) beginning tomm for CAP  -Pt scheduled to have outpatient PFT (09/14/13) to assess for obstructive/restrictive disease  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 TEE on 3/414 with EF 10-15% with diffuse hypokinesis and moderate mitral valve regurgitation. Pt with pro-BNP elevated at 5151 (1 month ago was 22K) and possible pulmonary edema on chest xray. Pt with weight on admission of 185 above reported baseline dry wt 175-180lb. Pt appears euvolemic on exam. No recent ICD shocks.  -Monitor daily weight and volume status  -Continue home digoxin 0.125 mg daily  -Obtain digoxin level --> wnl -Continue home carvedilol 3.125 mg daily  -continue home furosemide 40 mg BID  -Continue home losartan 25 mg daily  -Continue home magnesium oxide daily  -Continue outpatient milrinone   -Continue home slidenafil 20 mg TID  -Continue aspirin 81 mg daily -Pt follows with heart failure clincic (Dr. Haroldine Laws)  Paroxysmal Atrial Fibrillation - Pt currently in sinus rhythm with normal HR. Pt at home on amiodarone 200 mg daily for rhythm control and warfarin for [facility] therapy. Pt with subtherapeutic INR  (1.64)  on admission.   -Warfarin per pharmacy  -Continue home amiodarone 200 mg daily  -Continue to monitor INR with goal 2-3.  Chronic Kidney Disease Stage II - Pt with Cr on admission of 1.37, above baseline  of 1-1.3.   -Monitor volume status -Avoid nephrotoxins  -Monitor BMP  Normocytic Anemia  - Pt with history of since 1 month ago. Etiology most likely due to CKD.  -Consider anemia panel -Monitor for bleeding  Obstructive Sleep Apnea  - Pt reports compliance with CPAP at home. -Continue CPAP as tolerated at bedtime  Allergic Rhinitis - Pt reports symptoms controlled on home anti-histamine therapy (home cetirizine 10 mg daily). -Loratadine 10 mg daily (forumalary)   -Consider nasal corticosteroid, 1st line for AR  GERD - currently without reflux symptoms. Pt on PPI (protonix) therapy at home.  -Protonix 40 mg daily  Constipation - Currently without symptoms -Continue home senna 8.6 mg daily   Diet: Heart healthy DVT Ppx: Coumadin per pharmacy, SCD's Code: Full  Dispo: Disposition is deferred at this time, awaiting improvement of current medical problems. Anticipated discharge in approximately 2-3day(s).   The patient does have a current PCP Normand Sloop, MD) and does need an [facility] hospital follow-up appointment after discharge.  The patient does not have transportation limitations that hinder transportation to clinic appointments.  Signed: Juluis Mire, MD 09/02/2013, 8:30 PM

## 2013-09-02 NOTE — Telephone Encounter (Signed)
Needs ov  LMTCB 

## 2013-09-02 NOTE — ED Notes (Signed)
Pt to ED for evaluation of ongoing dry cough X 3 weeks, pt family reports he is supposed to have pulmonary function test completed but has not had it scheduled yet.  Pt is on continuous Milirone drip managed by Dr. Milas Kocher.  Alert and oriented X4, denies chest pain at this time.  Pt coughing at present.

## 2013-09-02 NOTE — Telephone Encounter (Signed)
pls call pt @ [facility] [phone] asap

## 2013-09-02 NOTE — ED Notes (Addendum)
Pt reporting coughing, emesis, joints aching, diarrhea. Pt denies CP. Pt is a x 4. Mask in place. Reports hx of CHF, sent here from MD for evaluation of fluid overload. Pt denies SOB. Speech is clear and concise. In NAD

## 2013-09-02 NOTE — ED Provider Notes (Signed)
CSN: [id number]     Arrival date & time 09/02/13  1314 History   First MD Initiated Contact with Patient 09/02/13 1659     Chief Complaint  Patient presents with  . Cough  . Emesis  . Joint Pain   HPI Comments: 73 yo M hx of CHF s/p ICD placement, CKD, GERD, 20 pack/year smoking hx presents with CC of cough.  Pt states symptoms started back in November 2014, and he has had multiple PCP and ED visits for this.  Last month he was diagnosed with bronchitis, and is supposed to have pulmonology f/u for further testing.  He had been treated with prednisone, albuterol inhaler recently, with mild benefit.  However, he states this has worsened in the last week.  He endorses nonproductive cough, with associated mild dyspnea, chills, nausea, post-tussive emesis, myalgias.  He denies fever, CP, abdominal pain, diarrhea, rash, or any other symptoms.  Pt is previous 20 pack/year smoker, but quit 20 years previous.    Patient is a 73 y.o. male presenting with cough and vomiting. The history is provided by the patient. No language interpreter was used.  Cough Cough characteristics:  Dry, non-productive, hacking and vomit-inducing Severity:  Severe Onset quality:  Gradual Duration:  1 week Timing:  Constant Progression:  Worsening Chronicity:  Recurrent Smoker: no (Quit 20 years ago, prior 20 pack/year smoker)   Context: upper respiratory infection   Context: not animal exposure, not exposure to allergens, not fumes, not occupational exposure, not sick contacts, not smoke exposure, not weather changes and not with activity   Relieved by:  Nothing Worsened by:  Nothing tried Ineffective treatments:  Rest, cough suppressants, decongestant and beta-agonist inhaler Associated symptoms: chills, myalgias, rhinorrhea and shortness of breath   Associated symptoms: no chest pain, no diaphoresis, no ear fullness, no ear pain, no eye discharge, no fever, no headaches, no rash, no sinus congestion, no sore throat, no  weight loss and no wheezing   Associated symptoms comment:  Nausea, post-tussive emesis Risk factors: recent infection   Risk factors: no chemical exposure and no recent travel   Emesis Associated symptoms: chills and myalgias   Associated symptoms: no abdominal pain, no diarrhea, no headaches and no sore throat     Past Medical History  Diagnosis Date  . Dysrhythmia   . ICD (implantable cardiac defibrillator) in place   . GERD (gastroesophageal reflux disease)   . Chronic kidney disease   . Anemia   . Pacemaker   . CHF (congestive heart failure)   . Sleep apnea     uses CPAP   Past Surgical History  Procedure Laterality Date  . Insert / replace / remove pacemaker    . Colon surgery      ruptured colon  . Tee without cardioversion N/A 09/16/2012    Procedure: TRANSESOPHAGEAL ECHOCARDIOGRAM (TEE);  Surgeon: Dolores Patty, MD;  Location: [facility] ENDOSCOPY;  Service: Cardiovascular;  Laterality: N/A;  . Cardioversion N/A 09/16/2012    Procedure: CARDIOVERSION;  Surgeon: Dolores Patty, MD;  Location: [facility] ENDOSCOPY;  Service: Cardiovascular;  Laterality: N/A;   Family History  Problem Relation Age of Onset  . Allergies Sister   . Heart disease Brother    History  Substance Use Topics  . Smoking status: Former Smoker -- 1.00 packs/day for 20 years    Types: Cigarettes    Quit date: 07/17/1983  . Smokeless tobacco: Never Used  . Alcohol Use: No    Review of Systems  Constitutional:  Positive for chills. Negative for fever, weight loss and diaphoresis.  HENT: Positive for congestion and rhinorrhea. Negative for ear pain, sore throat, trouble swallowing and voice change.   Eyes: Negative for discharge.  Respiratory: Positive for cough and shortness of breath. Negative for wheezing.   Cardiovascular: Negative for chest pain.  Gastrointestinal: Positive for nausea and vomiting. Negative for abdominal pain, diarrhea and constipation.  Musculoskeletal: Positive for myalgias.   Skin: Negative for rash.  Neurological: Negative for dizziness, weakness, light-headedness, numbness and headaches.  Hematological: Negative for adenopathy. Does not bruise/bleed easily.  All other systems reviewed and are negative.   Allergies  Review of patient's allergies indicates no known allergies.  Home Medications   Current Outpatient Rx  Name  Route  Sig  Dispense  Refill  . acetaminophen (TYLENOL) 650 MG CR tablet   Oral   Take 650 mg by mouth 2 (two) times daily as needed for pain.         Marland Kitchen amiodarone (PACERONE) 400 MG tablet   Oral   Take 200 mg by mouth daily.         Marland Kitchen aspirin EC 81 MG tablet   Oral   Take 81 mg by mouth daily.         . B Complex-Biotin-FA (HM VITAMIN B100 COMPLEX PO)   Oral   Take 1,000 mg by mouth daily with lunch.         . carvedilol (COREG) 6.25 MG tablet   Oral   Take 3.125 mg by mouth at bedtime.         . cetirizine (ZYRTEC) 10 MG tablet   Oral   Take 10 mg by mouth daily.         . digoxin (LANOXIN) 0.125 MG tablet   Oral   Take 1 tablet (0.125 mg total) by mouth daily.   90 tablet   3   . docusate sodium (DULCOLAX) 100 MG capsule   Oral   Take 100 mg by mouth at bedtime.         . Fe Fum-FA-B Cmp-C-Zn-Mg-Mn-Cu (FERROCITE PLUS PO)   Oral   Take 1 tablet by mouth daily with lunch.          . furosemide (LASIX) 40 MG tablet   Oral   Take 40 mg by mouth 2 (two) times daily.         . hydrocortisone cream 1 %   Topical   Apply 1 application topically 2 (two) times daily as needed (for itching or tape irritation).          Marland Kitchen losartan (COZAAR) 25 MG tablet   Oral   Take 1 tablet (25 mg total) by mouth daily.   90 tablet   3   . magnesium oxide (MAG-OX) 400 MG tablet   Oral   Take 800-1,200 mg by mouth daily. 2 tablets (800 mg) every morning and 3 tablets (1200 mg) at lunch         . Multiple Vitamins-Minerals (MULTIVITAMIN WITH MINERALS) tablet   Oral   Take 1 tablet by mouth daily with  lunch.          . pyridOXINE (VITAMIN B-6) 100 MG tablet   Oral   Take 100 mg by mouth daily with lunch.          . senna (SENOKOT) 8.6 MG TABS   Oral   Take 1 tablet by mouth at bedtime.          Marland Kitchen  sildenafil (REVATIO) 20 MG tablet   Oral   Take 1 tablet (20 mg total) by mouth 3 (three) times daily.   90 tablet   12   . sodium chloride 0.9 % SOLN with milrinone 1 MG/ML SOLN 200 mcg/mL   Intravenous   Inject into the vein continuous.         Marland Kitchen. warfarin (COUMADIN) 5 MG tablet   Oral   Take 5-7.5 mg by mouth daily. 1 1/2 tablets (7.5mg ) Tuesdays and Fridays, 1 tablet (5 mg) on Sunday, Monday, Wednesday, Thursday, Saturday          BP 121/87  Pulse 80  Temp(Src) 98.4 F (36.9 C) (Oral)  Resp 24  SpO2 92% Physical Exam  Nursing note and vitals reviewed. Constitutional: He is oriented to person, place, and time. He appears well-developed and well-nourished.  HENT:  Head: Normocephalic and atraumatic.  Right Ear: External ear normal.  Left Ear: External ear normal.  Nose: Nose normal.  Mouth/Throat: Oropharynx is clear and moist.  Eyes: Conjunctivae and EOM are normal. Pupils are equal, round, and reactive to light.  Neck: Normal range of motion. Neck supple. No JVD present.  Cardiovascular: Normal rate, regular rhythm, normal heart sounds and intact distal pulses.   Trace pitting edema bilaterally.   Pulmonary/Chest: Effort normal. No respiratory distress. He has no wheezes. He has no rales. He exhibits no tenderness.  Course breath sounds bilaterally.  Abdominal: Soft. Bowel sounds are normal. He exhibits no distension and no mass. There is no tenderness. There is no rebound and no guarding.  Musculoskeletal: Normal range of motion.  Neurological: He is alert and oriented to person, place, and time.  Skin: Skin is warm and dry.    ED Course  Procedures (including critical care time) Labs Review Labs Reviewed  CBC WITH DIFFERENTIAL - Abnormal; Notable for  the following:    WBC 14.2 (*)    RBC 3.90 (*)    Hemoglobin 12.2 (*)    HCT 36.1 (*)    Neutro Abs 10.4 (*)    Monocytes Relative 13 (*)    Monocytes Absolute 1.8 (*)    All other components within normal limits  PRO B NATRIURETIC PEPTIDE - Abnormal; Notable for the following:    Pro B Natriuretic peptide (BNP) 5151.0 (*)    All other components within normal limits  BASIC METABOLIC PANEL - Abnormal; Notable for the following:    Glucose, Bld 109 (*)    Creatinine, Ser 1.37 (*)    Calcium 10.6 (*)    GFR calc non Af Amer 50 (*)    GFR calc Af Amer 58 (*)    All other components within normal limits   Imaging Review Dg Chest 2 View  09/02/2013   CLINICAL DATA:  Cough and emesis.  Joint pain  EXAM: CHEST  2 VIEW  COMPARISON:  07/21/2013  FINDINGS: Stable appearance of dual-chamber left approach ICD/pacer. Right IJ central venous catheter is in stable position. Cardiomegaly which is also stable. New perihilar airspace disease, most dense on the right. No effusion or interstitial coarsening. No pneumothorax. No acute osseous findings.  IMPRESSION: Perihilar airspace disease is concerning for pneumonia given the focal appearance. Pulmonary edema can also have this appearance.   Electronically Signed   By: Tiburcio PeaJonathan  Watts M.D.   On: 09/02/2013 14:13    EKG Interpretation   None      MDM   Final diagnoses:  None   73 yo M hx of  CHF s/p ICD placement, CKD, GERD, 20 pack/year smoking hx presents with CC of cough.   Filed Vitals:   09/02/13 1317  BP: 121/87  Pulse: 80  Temp: 98.4 F (36.9 C)  Resp: 24   Physical exam as above.  Pt with harsh, nonproductive cough, with course breath sounds bilaterally, no wheeze, rales, or rhonci.  Pt has only trace pitting edema bilateral lower extremity edema, with no JVD.  EKG NSR, nonspecific ICVD w/ LAD.  Labs dmonstrate leukocytosis 14.2 with left shift.  Troponin WNL.  BNP slightly elevated 5151, although this lab is chronically elevated.   Cr slightly elevated from baseline.  CXR shows perihilar airspace disease concerning for PNA.    Concern for HCAP, given pt receiving home milrinone infusion in the last 30 days.  Vanc and Zosyn given in ED X 1 dose each.  Robitussin, Hycodan, albuterol given for cough, with some benefit.  CXR read also mentions possibility of pulmonary edema.  However pt euvolemic on exam, and CXR more concerning for infiltrate, with leukocytosis.    Medicine called, and pt to be admitted to their service for further evaluation and management.  Pt understands and agrees with plan.  Pt's care plan discussed with Dr. Bebe Shaggy.  Jon Gills, MD    Jon Gills, MD 09/03/13 [phone]

## 2013-09-02 NOTE — Progress Notes (Signed)
ANTICOAGULATION + ANTIBIOTIC CONSULT NOTE - Initial Consult  Pharmacy Consult for warfarin & vancomycin + zosyn Indication: atrial fibrillation & HCAP  No Known Allergies  Patient Measurements:    Vital Signs: Temp: 98.4 F (36.9 C) (02/18 1317) Temp src: Oral (02/18 1317) BP: 91/71 mmHg (02/18 1856) Pulse Rate: 91 (02/18 1856)  Labs:  Recent Labs  09/02/13 1409 09/02/13 1750  HGB 12.2*  --   HCT 36.1*  --   PLT 165  --   LABPROT  --  19.0*  INR  --  1.64*  CREATININE 1.37*  --     The CrCl is unknown because both a height and weight (above a minimum accepted value) are required for this calculation.   Medical History: Past Medical History  Diagnosis Date  . Dysrhythmia   . ICD (implantable cardiac defibrillator) in place   . GERD (gastroesophageal reflux disease)   . Chronic kidney disease   . Anemia   . Pacemaker   . CHF (congestive heart failure)   . Sleep apnea     uses CPAP    Medications:  Anti-infectives   Start     Dose/Rate Route Frequency Ordered Stop   09/03/13 0800  vancomycin (VANCOCIN) IVPB 1000 mg/200 mL premix     1,000 mg 200 mL/hr over 60 Minutes Intravenous Every 12 hours 09/02/13 2135     09/03/13 0000  piperacillin-tazobactam (ZOSYN) IVPB 3.375 g     3.375 g 12.5 mL/hr over 240 Minutes Intravenous Every 8 hours 09/02/13 2135     09/02/13 1800  vancomycin (VANCOCIN) 1,500 mg in sodium chloride 0.9 % 500 mL IVPB     1,500 mg 250 mL/hr over 120 Minutes Intravenous  Once 09/02/13 1751 09/02/13 2040   09/02/13 1800  piperacillin-tazobactam (ZOSYN) IVPB 3.375 g  Status:  Discontinued     3.375 g 12.5 mL/hr over 240 Minutes Intravenous  Once 09/02/13 1751 09/02/13 1752   09/02/13 1800  piperacillin-tazobactam (ZOSYN) IVPB 3.375 g     3.375 g 100 mL/hr over 30 Minutes Intravenous  Once 09/02/13 1753 09/02/13 1831      Assessment: 72 yom presented to the ED with cough. He is on chronic coumadin for afib. INR is subtherapeutic at 1.64  and CBC is WNL. No bleeding noted.   Also to start empiric vancomycin + zosyn for pneumonia. Pt is afebrile and WBC is elevated at 14.2. Scr is also elevated at 1.37. Received first doses of zosyn and vanc in the ED.   Vanc 2/18>> Zosyn 2/18>>  Goal of Therapy:  INR 2-3 Vanc trough 15-20   Plan:  1. Warfarin 7.5mg  PO x 1 tonight 2. Daily INR 3. Vancomycin 1gm IV Q12H 4. Zosyn 3.375gm IV Q8H (4 hr inf) 5. F/u renal fxn, C&S, clinical status and trough at [facility], Drake Leach 09/02/2013,9:37 PM

## 2013-09-02 NOTE — ED Provider Notes (Signed)
Patient seen/examined in the Emergency Department in conjunction with Resident Physician Provider Hyman Hopes  Patient reports cough Exam : awake/alert, frequent coughing noted Plan: will admit for pneumonia    Joya Gaskins, MD 09/02/13 1816

## 2013-09-03 ENCOUNTER — Encounter (HOSPITAL_COMMUNITY): Payer: Self-pay | Admitting: General Practice

## 2013-09-03 ENCOUNTER — Encounter: Payer: Self-pay | Admitting: Internal Medicine

## 2013-09-03 DIAGNOSIS — K219 Gastro-esophageal reflux disease without esophagitis: Secondary | ICD-10-CM

## 2013-09-03 DIAGNOSIS — N182 Chronic kidney disease, stage 2 (mild): Secondary | ICD-10-CM

## 2013-09-03 DIAGNOSIS — K5900 Constipation, unspecified: Secondary | ICD-10-CM

## 2013-09-03 DIAGNOSIS — J309 Allergic rhinitis, unspecified: Secondary | ICD-10-CM

## 2013-09-03 DIAGNOSIS — A419 Sepsis, unspecified organism: Secondary | ICD-10-CM

## 2013-09-03 DIAGNOSIS — R059 Cough, unspecified: Secondary | ICD-10-CM

## 2013-09-03 DIAGNOSIS — I509 Heart failure, unspecified: Secondary | ICD-10-CM

## 2013-09-03 DIAGNOSIS — N179 Acute kidney failure, unspecified: Secondary | ICD-10-CM

## 2013-09-03 DIAGNOSIS — G4733 Obstructive sleep apnea (adult) (pediatric): Secondary | ICD-10-CM

## 2013-09-03 DIAGNOSIS — I5020 Unspecified systolic (congestive) heart failure: Secondary | ICD-10-CM

## 2013-09-03 DIAGNOSIS — R509 Fever, unspecified: Secondary | ICD-10-CM

## 2013-09-03 DIAGNOSIS — D649 Anemia, unspecified: Secondary | ICD-10-CM

## 2013-09-03 DIAGNOSIS — R05 Cough: Secondary | ICD-10-CM

## 2013-09-03 DIAGNOSIS — R1110 Vomiting, unspecified: Secondary | ICD-10-CM

## 2013-09-03 LAB — COMPREHENSIVE METABOLIC PANEL
ALK PHOS: 57 U/L (ref 39–117)
ALT: 33 U/L (ref 0–53)
AST: 68 U/L — ABNORMAL HIGH (ref 0–37)
Albumin: 3.1 g/dL — ABNORMAL LOW (ref 3.5–5.2)
BUN: 26 mg/dL — ABNORMAL HIGH (ref 6–23)
CO2: 23 meq/L (ref 19–32)
Calcium: 9.9 mg/dL (ref 8.4–10.5)
Chloride: 98 mEq/L (ref 96–112)
Creatinine, Ser: 1.9 mg/dL — ABNORMAL HIGH (ref 0.50–1.35)
GFR, EST AFRICAN AMERICAN: 39 mL/min — AB (ref >90)
GFR, EST NON AFRICAN AMERICAN: 34 mL/min — AB (ref >90)
Glucose, Bld: 112 mg/dL — ABNORMAL HIGH (ref 70–99)
POTASSIUM: 3.7 meq/L (ref 3.7–5.3)
SODIUM: 137 meq/L (ref 137–147)
TOTAL PROTEIN: 6.4 g/dL (ref 6.0–8.3)
Total Bilirubin: 2.2 mg/dL — ABNORMAL HIGH (ref 0.3–1.2)

## 2013-09-03 LAB — CBC
HEMATOCRIT: 31.9 % — AB (ref 39.0–52.0)
Hemoglobin: 10.7 g/dL — ABNORMAL LOW (ref 13.0–17.0)
MCH: 31.1 pg (ref 26.0–34.0)
MCHC: 33.5 g/dL (ref 30.0–36.0)
MCV: 92.7 fL (ref 78.0–100.0)
Platelets: 151 10*3/uL (ref 150–400)
RBC: 3.44 MIL/uL — ABNORMAL LOW (ref 4.22–5.81)
RDW: 15.7 % — ABNORMAL HIGH (ref 11.5–15.5)
WBC: 20.9 10*3/uL — ABNORMAL HIGH (ref 4.0–10.5)

## 2013-09-03 LAB — PROTIME-INR
INR: 1.8 — ABNORMAL HIGH (ref 0.00–1.49)
Prothrombin Time: 20.4 seconds — ABNORMAL HIGH (ref 11.6–15.2)

## 2013-09-03 LAB — HEPATITIS PANEL, ACUTE
HCV Ab: NEGATIVE
HEP A IGM: NONREACTIVE
Hep B C IgM: NONREACTIVE
Hepatitis B Surface Ag: NEGATIVE

## 2013-09-03 LAB — STREP PNEUMONIAE URINARY ANTIGEN: Strep Pneumo Urinary Antigen: NEGATIVE

## 2013-09-03 LAB — HIV ANTIBODY (ROUTINE TESTING W REFLEX): HIV: NONREACTIVE

## 2013-09-03 MED ORDER — COUMADIN BOOK
1.0000 | Freq: Once | Status: AC
  Administered 2013-09-03: 1
  Filled 2013-09-03: qty 1

## 2013-09-03 MED ORDER — RACEPINEPHRINE HCL 2.25 % IN NEBU
0.5000 mL | INHALATION_SOLUTION | Freq: Once | RESPIRATORY_TRACT | Status: AC
  Administered 2013-09-03: 0.5 mL via RESPIRATORY_TRACT
  Filled 2013-09-03: qty 0.5

## 2013-09-03 MED ORDER — WARFARIN SODIUM 7.5 MG PO TABS
7.5000 mg | ORAL_TABLET | Freq: Once | ORAL | Status: AC
  Administered 2013-09-03: 7.5 mg via ORAL
  Filled 2013-09-03: qty 1

## 2013-09-03 MED ORDER — SODIUM CHLORIDE 0.9 % IV SOLN: INTRAVENOUS | Status: AC

## 2013-09-03 MED ORDER — WARFARIN VIDEO: 1.0000 | Freq: Once | Status: DC

## 2013-09-03 MED ORDER — MILRINONE IN DEXTROSE 20 MG/100ML IV SOLN
0.2500 ug/kg/min | INTRAVENOUS | Status: DC
  Administered 2013-09-03 – 2013-09-14 (×16): 0.25 ug/kg/min via INTRAVENOUS
  Filled 2013-09-03 (×16): qty 100

## 2013-09-03 MED ORDER — SODIUM CHLORIDE 0.9 % IV SOLN: INTRAVENOUS | Status: DC

## 2013-09-03 MED ORDER — SODIUM CHLORIDE 0.9 % IV SOLN
INTRAVENOUS | Status: DC
  Administered 2013-09-03: 07:00:00 via INTRAVENOUS

## 2013-09-03 MED ORDER — MILRINONE IN DEXTROSE 20 MG/100ML IV SOLN: 0.2500 ug/kg/min | INTRAVENOUS | Status: DC

## 2013-09-03 MED ORDER — PROMETHAZINE HCL 25 MG/ML IJ SOLN: 6.2500 mg | Freq: Four times a day (QID) | INTRAMUSCULAR | Status: DC | PRN

## 2013-09-03 NOTE — Telephone Encounter (Signed)
lmtcb x1 

## 2013-09-03 NOTE — ED Provider Notes (Signed)
I have personally seen and examined the patient.  I have discussed the plan of care with the resident.  I have reviewed the documentation on PMH/FH/Soc. History.  I have reviewed the documentation of the resident and agree.      Joya Gaskins, MD 09/03/13 [phone]

## 2013-09-03 NOTE — Progress Notes (Signed)
Subjective: Doing fine, coughing less, still feels very weak  Objective: Vital signs in last 24 hours: Filed Vitals:   09/03/13 0551 09/03/13 0647 09/03/13 0658 09/03/13 0949  BP: 81/44  95/36   Pulse: 83  78 77  Temp: 100.5 F (38.1 C)     TempSrc: Oral     Resp: 18     Height:      Weight:      SpO2:  100%     Weight change:  No intake or output data in the 24 hours ending 09/03/13 1043 General: resting in bed, no acute distress Cardiac: RRR, no rubs, murmurs or gallops Pulm: clear to auscultation bilaterally, with decreased air mvmt Abd: soft, nontender, nondistended, BS normoactive Ext: warm and well perfused, no pedal edema Neuro: alert and oriented X3, cranial nerves II-XII grossly intact  Lab Results: Basic Metabolic Panel:  Recent Labs Lab 09/02/13 1409 09/03/13 0458  NA 140 137  K 3.9 3.7  CL 100 98  CO2 26 23  GLUCOSE 109* 112*  BUN 18 26*  CREATININE 1.37* 1.90*  CALCIUM 10.6* 9.9   Liver Function Tests:  Recent Labs Lab 09/03/13 0458  AST 68*  ALT 33  ALKPHOS 57  BILITOT 2.2*  PROT 6.4  ALBUMIN 3.1*   CBC:  Recent Labs Lab 09/02/13 1409 09/03/13 0458  WBC 14.2* 20.9*  NEUTROABS 10.4*  --   HGB 12.2* 10.7*  HCT 36.1* 31.9*  MCV 92.6 92.7  PLT 165 151   BNP:  Recent Labs Lab 09/02/13 1409  PROBNP 5151.0*   Coagulation:  Recent Labs Lab 09/02/13 1750 09/03/13 0458  LABPROT 19.0* 20.4*  INR 1.64* 1.80*   Studies/Results: Dg Chest 2 View  09/02/2013   CLINICAL DATA:  Cough and emesis.  Joint pain  EXAM: CHEST  2 VIEW  COMPARISON:  07/21/2013  FINDINGS: Stable appearance of dual-chamber left approach ICD/pacer. Right IJ central venous catheter is in stable position. Cardiomegaly which is also stable. New perihilar airspace disease, most dense on the right. No effusion or interstitial coarsening. No pneumothorax. No acute osseous findings.  IMPRESSION: Perihilar airspace disease is concerning for pneumonia given the focal  appearance. Pulmonary edema can also have this appearance.   Electronically Signed   By: Tiburcio PeaJonathan  Watts M.D.   On: 09/02/2013 14:13   Medications: I have reviewed the patient's current medications. Scheduled Meds: . amiodarone  200 mg Oral Daily  . aspirin EC  81 mg Oral Daily  . [START ON 09/04/2013] azithromycin  250 mg Oral Daily  . benzonatate  200 mg Oral TID  . carvedilol  3.125 mg Oral QHS  . cefTRIAXone (ROCEPHIN)  IV  1 g Intravenous Q24H  . digoxin  0.125 mg Oral Daily  . docusate sodium  100 mg Oral QHS  . ferrous fumarate-b12-vitamic C-folic acid  1 capsule Oral Q lunch  . furosemide  40 mg Oral BID  . guaiFENesin  600 mg Oral BID  . loratadine  10 mg Oral Daily  . losartan  25 mg Oral Daily  . magnesium oxide  1,000 mg Oral BID  . pantoprazole (PROTONIX) IV  40 mg Intravenous QHS  . pyridOXINE  100 mg Oral Q lunch  . senna  1 tablet Oral QHS  . sildenafil  20 mg Oral TID  . sodium chloride  3 mL Intravenous Q12H  . warfarin  7.5 mg Oral Once  . Warfarin - Pharmacist Dosing Inpatient   Does not apply [phone]  Continuous Infusions: . sodium chloride 75 mL/hr at 09/03/13 0656  . milrinone    . sodium chloride 0.9 % 220 mL with milrinone (PRIMACOR) 220,000 mcg infusion     PRN Meds:.acetaminophen, albuterol, alum & mag hydroxide-simeth, promethazine  Assessment/Plan: Gregg [name] is a 73 yo M with sCHF, GERD, CKD, and OSA on CPAP who was admitted on 09/02/13 with worsening cough.  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 No in/outs recorded. Weight 185 --> 198.  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  Anticipated discharge in approximately 2-3 day(s).   The patient does have a current PCP Exie Parody, MD) and does not need an [facility] hospital follow-up appointment after discharge.  The patient does not have transportation limitations that hinder transportation to clinic appointments.  .Services Needed at time of discharge: Y = Yes, Blank = No PT:   OT:   RN:   Equipment:   Other:     LOS: 1 day   Belia Heman, MD 09/03/2013, 10:43 AM

## 2013-09-03 NOTE — Progress Notes (Signed)
Brief Interval Progress Note  S: I was called to the patient's room around 9:30.  After walking to the bathroom, the patient began coughing, and subsequently had desaturations to the 70's on [place].  The patient was placed on NRB mask, but O2 sats rose only to the 80's.  The patient was given albuterol, with minimal improvement.  After a few minutes, the patients sats slowly climbed to 100%.  However, while in the room, the patient would have occasional episodes of coughing, during which sats would drop to the 80's, despite NRB.  The patient noted subjective dyspnea during these episodes, but remained alert and oriented (though stated month as Jan 2015).   OCeasar Mons Vitals:   09/03/13 0658 09/03/13 0949 09/03/13 1355 09/03/13 2108  BP: 95/36  103/53 113/64  Pulse: 78 77 82 93  Temp:   99.1 F (37.3 C) 101.1 F (38.4 C)  TempSrc:   Oral Oral  Resp:   23 22  Height:      Weight:      SpO2:   95% 94%  Gen: Lying in bed, appears mildly uncomfortable Lungs: tachypneic, mild crackles at bilateral bases, no wheezes, speaks only a few words at a time CV: RRR Ext: only trace LE edema  A/P: [name] desaturations with coughing likely represent an upper airway process (ie vocal cord dysfunction, etc.).  No wheezes to suggest obstructive intrapulmonary process.  Mild crackles at lung bases but no LE edema, and good UOP today (reported by pt, not recorded), so CHF unlikely contributing. -give racemic epi x1, assess response -transfer to stepdown -if no improvement with racemic epi, start BiPap -place bedside commode, to limit exertion, which seems to trigger episodes of coughing -continue tessalon -consider pulmonology vs ENT consult in AM   Signed, Janalyn Harder, PGY3 09/03/2013, 9:55 PM

## 2013-09-03 NOTE — Progress Notes (Signed)
[facility]  Patient Status: Active pt with [facility]  [facility] is providing the following services:  Gregg [name] has [name] RN with [facility] services for home [place] with [facility].  [name] Coordinator will follow and support transition home when needed.  If patient discharges after hours, please call [phone].   [name] 09/03/2013, 4:57 PM

## 2013-09-03 NOTE — Progress Notes (Signed)
Utilization review completed. Yarely Bebee, RN, BSN. 

## 2013-09-03 NOTE — Progress Notes (Signed)
Pt up to BR upon return c/o SOB SAO2 55% O2 reapplied per N/C at 5l SAO2 back up to 100% after approx, 5 min. Slow recovery noted O2 down to 2L which is what he was on prior to BR SAO2 immediately dropped to 80% O2 back to 5L, pt doing better after . O2 turned down to 4L will cont to monitor and observe. Pt c/o of loose stools today. Has noted 4 episodes. TC to MD to update.

## 2013-09-03 NOTE — Progress Notes (Signed)
Medical Student Daily Progress Note  Subjective: Nurse reports that patient usually uses CPAP but was somehow not given last given. He had one desaturating episode to 80%, but improved with 6L O2. Patient is currently on 2L O2 with O2 sats in 90's.  One episode of fever to 101.5.   Patient reports feeling better with fewer coughs.   Code status: DNI only, Power of Gregg [name] is wife.   Objective: Vital signs in last 24 hours: Filed Vitals:   09/03/13 0500 09/03/13 0551 09/03/13 0647 09/03/13 0658  BP:  81/44  95/36  Pulse:  83  78  Temp:  100.5 F (38.1 C)    TempSrc:  Oral    Resp:  18    Height:      Weight: 89.858 kg (198 lb 1.6 oz)     SpO2:   100%    Weight change:  No intake or output data in the 24 hours ending 09/03/13 0928 Physical Exam: BP 95/36  Pulse 78  Temp(Src) 100.5 F (38.1 C) (Oral)  Resp 18  Ht 5\' 10"  (1.778 m)  Wt 89.858 kg (198 lb 1.6 oz)  BMI 28.42 kg/m2  SpO2 100% General appearance: cooperative, no distress and eyes closed, speaking in complete sentences, no accessory muscles used Neck: JVD - several cm above sternal notch and no carotid bruit; Hard to appreciate JVD as it is believed that JVD is above the mandible.  Lungs: Crackles heard in bilateral lower bases and faint crackles in bilateral upper and mid lung fields, tympanic to percussion bilaterally Heart: regular rate and rhythm, S1, S2 normal, no murmur, click, rub or gallop, Central line placed for outpatient IV milrinone Abdomen: soft, non-tender; bowel sounds normal; no masses,  no organomegaly Extremities: No pedal edema bilaterally Pulses: 2+ and symmetric Lab Results: BMET    Component Value Date/Time   NA 137 09/03/2013 0458   K 3.7 09/03/2013 0458   CL 98 09/03/2013 0458   CO2 23 09/03/2013 0458   GLUCOSE 112* 09/03/2013 0458   BUN 26* 09/03/2013 0458   CREATININE 1.90* 09/03/2013 0458   CALCIUM 9.9 09/03/2013 0458   GFRNONAA 34* 09/03/2013 0458   GFRAA 39* 09/03/2013 0458   Lab  Results  Component Value Date   ALT 33 09/03/2013   AST 68* 09/03/2013   ALKPHOS 57 09/03/2013   BILITOT 2.2* 09/03/2013     CBC    Component Value Date/Time   WBC 20.9* 09/03/2013 0458   RBC 3.44* 09/03/2013 0458   HGB 10.7* 09/03/2013 0458   HCT 31.9* 09/03/2013 0458   PLT 151 09/03/2013 0458   MCV 92.7 09/03/2013 0458   MCH 31.1 09/03/2013 0458   MCHC 33.5 09/03/2013 0458   RDW 15.7* 09/03/2013 0458   LYMPHSABS 1.8 09/02/2013 1409   MONOABS 1.8* 09/02/2013 1409   EOSABS 0.2 09/02/2013 1409   BASOSABS 0.0 09/02/2013 1409   Lab Results  Component Value Date   INR 1.80* 09/03/2013   INR 1.64* 09/02/2013   INR 2.58* 07/21/2013     Micro Results: No results found for this or any previous visit (from the past 240 hour(s)). Studies/Results: Dg Chest 2 View  09/02/2013   CLINICAL DATA:  Cough and emesis.  Joint pain  EXAM: CHEST  2 VIEW  COMPARISON:  07/21/2013  FINDINGS: Stable appearance of dual-chamber left approach ICD/pacer. Right IJ central venous catheter is in stable position. Cardiomegaly which is also stable. New perihilar airspace disease, most dense on the right. No effusion  or interstitial coarsening. No pneumothorax. No acute osseous findings.  IMPRESSION: Perihilar airspace disease is concerning for pneumonia given the focal appearance. Pulmonary edema can also have this appearance.   Electronically Signed   By: Tiburcio Pea M.D.   On: 09/02/2013 14:13   Medications: I have reviewed the patient's current medications. Scheduled Meds: . amiodarone  200 mg Oral Daily  . aspirin EC  81 mg Oral Daily  . azithromycin  500 mg Oral Daily   Followed by  . [START ON 09/04/2013] azithromycin  250 mg Oral Daily  . benzonatate  200 mg Oral TID  . carvedilol  3.125 mg Oral QHS  . cefTRIAXone (ROCEPHIN)  IV  1 g Intravenous Q24H  . digoxin  0.125 mg Oral Daily  . docusate sodium  100 mg Oral QHS  . ferrous fumarate-b12-vitamic C-folic acid  1 capsule Oral Q lunch  . furosemide  40 mg Oral  BID  . guaiFENesin  600 mg Oral BID  . loratadine  10 mg Oral Daily  . losartan  25 mg Oral Daily  . magnesium oxide  1,000 mg Oral BID  . pantoprazole (PROTONIX) IV  40 mg Intravenous QHS  . pyridOXINE  100 mg Oral Q lunch  . senna  1 tablet Oral QHS  . sildenafil  20 mg Oral TID  . sodium chloride  3 mL Intravenous Q12H  . warfarin  7.5 mg Oral Once  . Warfarin - Pharmacist Dosing Inpatient   Does not apply q1800   Continuous Infusions: . sodium chloride 75 mL/hr at 09/03/13 0656  . milrinone    . sodium chloride 0.9 % 220 mL with milrinone (PRIMACOR) 220,000 mcg infusion     PRN Meds:.acetaminophen, albuterol, alum & mag hydroxide-simeth, promethazine Assessment/Plan: Principal Problem:   Chronic cough Active Problems:   Atrial fibrillation   Chronic systolic heart failure   Sleep apnea   Pneumonia   LOS: 1 day  Will continue on antibiotics for CAP. WBC is increased to 20  Gregg [name] is a 73yo man with h/o CHF w/ icd on milrinone, Afib on warfarin, OSA on CPAP, allergic rhinitis, and GERD who presents with worsening cough with elevated WBC, likely from CAP.   # Acute on chronic cough with post-tussive emesis most likely secondary to CAP: Patient receives milrinone IV from home health, but has been having worse cough for the past week without fever. However, patient is septic with fever of 101.5 overnight, leukocytosis of 20.9 from 14.2 on admission, and possible CXR infiltration either for PNA or pulm edema. CAP with atypical organism is the most likely cause. Other possible causes include GERD, post-nasal drip cough, chronic bronchitis (former smoker), pulmonary edema from heart failure. However, HEENT exam is normal, and patient is on PPI.  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 TEE on 3/414 with EF 10-15% with diffuse hypokinesis and moderate mitral valve regurgitation. Pt with pro-BNP elevated at 5151 (1 month ago was 22K) and possible pulmonary edema on chest xray. Pt with weight on admission of 185 above reported baseline dry wt 175-180lb. Current weight  is 198lb. Cr 1.90 (baseline of 0.9 - 1.2) Pt appears slightly volume overloaded with JVD. No recent ICD shocks.  -Stop 1ml/hr IVF -Digoxin 0.125 mg qD -Continue furosemide 40mg  PO BID and consider increase if CHF worsens  -Hold ARB while patient has AKI due to Cr from 1.3 --> 1.9 -Magnesium oxide daily -Outpatient milrinone per home health -Aspirin 81mg  qD -Daily weights -Patient follows with heart failure clinic (Dr. Marica Otter)  # Paroxysmal Atrial Fibrillation:  Pt currently in sinus rhythm with normal HR. Pt at home on amiodarone 200 mg daily for rhythm control and warfarin for [facility] therapy. Pt with subtherapeutic today INR (1.80). -Warfarin per pharmacy  -Continue home amiodarone 200 mg daily  -Continue to monitor INR with goal 2-3.  # AKI on CKD stage 2 - Current Cr 1.90. Pt with Cr on admission of 1.37, above baseline of 1-1.3.  -Furosemide to40mg  PO BID -Hold ARB while in AKI -Monitor volume status  -Avoid nephrotoxins  -Monitor BMP  # Normocytic Anemia - Hgb of 10.7 from 12.2. Likely hemodilution from acute CHF. Pt with history of since 1 month ago. Etiology most likely due to CKD.  -Consider anemia panel  -Monitor for bleeding  -Monitor CBC  # Obstructive Sleep Apnea - Pt reports compliance with CPAP at home.  -Continue CPAP as tolerated at bedtime   # Allergic Rhinitis - Pt reports symptoms controlled on home anti-histamine  therapy (home cetirizine 10 mg daily).  -Loratadine 10 mg daily (forumalary)  -Consider nasal corticosteroid, 1st line for AR   # GERD - currently without reflux symptoms. Pt on PPI (protonix) therapy at home.  -Protonix 40 mg daily   # Constipation - Currently without symptoms  -Continue home senna 8.6 mg daily   # Diet: Heart healthy  # DVT Ppx: Coumadin per pharmacy, SCD's  # Code: DNR only, POA is wife   This is a Psychologist, occupational Note.  The care of the patient was discussed with Dr. Luciana Axe and the assessment and plan formulated with their assistance.  Please see their attached note for official documentation of the daily encounter.  Rhea Pink 09/03/2013, 9:28 AM

## 2013-09-03 NOTE — ED Provider Notes (Signed)
Date: 09/02/2013  Rate: 91  Rhythm: normal sinus rhythm  QRS Axis: normal  Intervals: PR prolonged  ST/T Wave abnormalities: nonspecific ST changes  Conduction Disutrbances:nonspecific intraventricular conduction delay     Joya Gaskins, MD 09/03/13 0107

## 2013-09-03 NOTE — H&P (Signed)
  Date: 09/03/2013  Patient name: Gregg [name]  Medical record number: [id number]  Date of birth: 10-07-1940   I have seen and evaluated Gregg [name] and discussed their care with the Residency Team.   Assessment and Plan: I have seen and evaluated the patient as outlined above. I agree with the formulated Assessment and Plan as detailed in the residents' admission note, with the following changes:   1. CAP.  Febrile and WBC up.  Will continue with current plan.    Gardiner Barefoot, MD 2/19/20159:19 AM

## 2013-09-03 NOTE — Progress Notes (Signed)
[facility] Center[facility]ilIT consultan0454Dwana Melenaargaret P[facility] Special[facility] Surgery Laser CenterLennette 09[address] TAG>XTTAG> ADTEXTTAG>untant  Scheduled:  Scheduled:  . amiodarone  200 mg Oral Daily  . aspirin EC  81 mg Oral Daily  . [START ON 09/04/2013] azithromycin  250 mg Oral Daily  . benzonatate  200 mg Oral TID  . carvedilol  3.125 mg Oral QHS  . cefTRIAXone (ROCEPHIN)  IV  1 g Intravenous Q24H  . digoxin  0.125 mg Oral Daily  . docusate sodium  100 mg Oral QHS  . ferrous fumarate-b12-vitamic C-folic acid  1 capsule Oral Q lunch  . furosemide  40 mg Oral BID  . guaiFENesin  600 mg Oral BID  . loratadine  10 mg Oral Daily  . magnesium oxide  1,000 mg Oral BID  . pantoprazole (PROTONIX) IV  40 mg Intravenous QHS  . pyridOXINE  100 mg Oral Q lunch  . senna  1 tablet Oral QHS  . sildenafil  20 mg Oral TID  . sodium chloride  3 mL Intravenous Q12H  . warfarin  7.5 mg Oral Once  . Warfarin - Pharmacist Dosing Inpatient   Does not apply q1800    Infusion[s]: Infusions:  . milrinone    . sodium chloride 0.9 % 220 mL with milrinone (PRIMACOR) 220,000 mcg infusion      Antibiotic[s]: Anti-infectives   Start     Dose/Rate Route Frequency Ordered Stop   09/04/13 1000  azithromycin (ZITHROMAX) tablet  250 mg     250 mg Oral Daily 09/02/13 2250 09/08/13 0959   09/03/13 1000  azithromycin (ZITHROMAX) tablet 500 mg     500 mg Oral Daily 09/02/13 2250 09/03/13 0951   09/03/13 0000  cefTRIAXone (ROCEPHIN) 1 g in dextrose 5 % 50 mL IVPB     1 g 100 mL/hr over 30 Minutes Intravenous Every 24 hours 09/02/13 2250        Assessment :  Today's INR is 1.8.   INR is Subtherapeutic.    Total Bilirubin is 2.2. Patient is on Phenergan 12.5 mg IV q 6 hrs prn.  With Bilirubin > 2, Phenergan dose will be adjusted per [facility] P [facility] Med Safety Protocol.  No bleeding complications observed.  Goal :  INR goal is 2-3    Plan : 1. Coumadin 7.5 mg po today.  2. Daily INR's, CBC. Monitor for bleeding complications 3. Reduce Phenergan to 6.25 mg IV q 6 hours prn as per [facility] Med Safety Policy.  Kylyn Mcdade, Elisha Headland, Pharm.D. 09/03/2013  10:58 AM

## 2013-09-04 ENCOUNTER — Inpatient Hospital Stay (HOSPITAL_COMMUNITY): Payer: Medicare Other

## 2013-09-04 DIAGNOSIS — J810 Acute pulmonary edema: Secondary | ICD-10-CM | POA: Diagnosis present

## 2013-09-04 DIAGNOSIS — I5023 Acute on chronic systolic (congestive) heart failure: Principal | ICD-10-CM | POA: Diagnosis present

## 2013-09-04 DIAGNOSIS — J9600 Acute respiratory failure, unspecified whether with hypoxia or hypercapnia: Secondary | ICD-10-CM | POA: Diagnosis present

## 2013-09-04 DIAGNOSIS — R570 Cardiogenic shock: Secondary | ICD-10-CM

## 2013-09-04 DIAGNOSIS — I5021 Acute systolic (congestive) heart failure: Secondary | ICD-10-CM

## 2013-09-04 LAB — BASIC METABOLIC PANEL
BUN: 37 mg/dL — ABNORMAL HIGH (ref 6–23)
CO2: 24 meq/L (ref 19–32)
Calcium: 10 mg/dL (ref 8.4–10.5)
Chloride: 101 mEq/L (ref 96–112)
Creatinine, Ser: 1.65 mg/dL — ABNORMAL HIGH (ref 0.50–1.35)
GFR calc Af Amer: 47 mL/min — ABNORMAL LOW (ref >90)
GFR calc non Af Amer: 40 mL/min — ABNORMAL LOW (ref >90)
Glucose, Bld: 133 mg/dL — ABNORMAL HIGH (ref 70–99)
Potassium: 3.8 mEq/L (ref 3.7–5.3)
SODIUM: 139 meq/L (ref 137–147)

## 2013-09-04 LAB — CBC
HCT: 32.5 % — ABNORMAL LOW (ref 39.0–52.0)
Hemoglobin: 11.1 g/dL — ABNORMAL LOW (ref 13.0–17.0)
MCH: 31.2 pg (ref 26.0–34.0)
MCHC: 34.2 g/dL (ref 30.0–36.0)
MCV: 91.3 fL (ref 78.0–100.0)
Platelets: 180 10*3/uL (ref 150–400)
RBC: 3.56 MIL/uL — AB (ref 4.22–5.81)
RDW: 15.3 % (ref 11.5–15.5)
WBC: 19.2 10*3/uL — AB (ref 4.0–10.5)

## 2013-09-04 LAB — CLOSTRIDIUM DIFFICILE BY PCR: Toxigenic C. Difficile by PCR: NEGATIVE

## 2013-09-04 LAB — RESPIRATORY VIRUS PANEL
ADENOVIRUS: NOT DETECTED
Influenza A H1: NOT DETECTED
Influenza A H3: NOT DETECTED
Influenza A: NOT DETECTED
Influenza B: NOT DETECTED
METAPNEUMOVIRUS: NOT DETECTED
PARAINFLUENZA 2 A: NOT DETECTED
PARAINFLUENZA 3 A: NOT DETECTED
Parainfluenza 1: NOT DETECTED
Respiratory Syncytial Virus A: NOT DETECTED
Respiratory Syncytial Virus B: NOT DETECTED
Rhinovirus: NOT DETECTED

## 2013-09-04 LAB — BLOOD GAS, ARTERIAL
ACID-BASE EXCESS: 4 mmol/L — AB (ref 0.0–2.0)
BICARBONATE: 27.6 meq/L — AB (ref 20.0–24.0)
Collection site: 28701
EXPIRATORY PAP: 8
FIO2: 100 %
INSPIRATORY PAP: 18
Mode: POSITIVE
O2 SAT: 89.6 %
Patient temperature: 98.6
TCO2: 28.8 mmol/L (ref 0–100)
pCO2 arterial: 38.7 mmHg (ref 35.0–45.0)
pH, Arterial: 7.467 — ABNORMAL HIGH (ref 7.350–7.450)
pO2, Arterial: 57.8 mmHg — ABNORMAL LOW (ref 80.0–100.0)

## 2013-09-04 LAB — LEGIONELLA ANTIGEN, URINE: Legionella Antigen, Urine: NEGATIVE

## 2013-09-04 LAB — PROTIME-INR
INR: 2.24 — AB (ref 0.00–1.49)
PROTHROMBIN TIME: 24.1 s — AB (ref 11.6–15.2)

## 2013-09-04 LAB — PROCALCITONIN: PROCALCITONIN: 8.12 ng/mL

## 2013-09-04 LAB — CARBOXYHEMOGLOBIN
CARBOXYHEMOGLOBIN: 2 % — AB (ref 0.5–1.5)
Methemoglobin: 0.8 % (ref 0.0–1.5)
O2 SAT: 65.1 %
TOTAL HEMOGLOBIN: 11.6 g/dL — AB (ref 13.5–18.0)

## 2013-09-04 LAB — GLUCOSE, CAPILLARY: GLUCOSE-CAPILLARY: 91 mg/dL (ref 70–99)

## 2013-09-04 LAB — MRSA PCR SCREENING: MRSA BY PCR: NEGATIVE

## 2013-09-04 MED ORDER — PIPERACILLIN-TAZOBACTAM 3.375 G IVPB
3.3750 g | Freq: Three times a day (TID) | INTRAVENOUS | Status: DC
  Filled 2013-09-04 (×2): qty 50

## 2013-09-04 MED ORDER — VANCOMYCIN HCL IN DEXTROSE 750-5 MG/150ML-% IV SOLN
750.0000 mg | Freq: Two times a day (BID) | INTRAVENOUS | Status: DC
  Administered 2013-09-04 – 2013-09-06 (×6): 750 mg via INTRAVENOUS
  Filled 2013-09-04 (×8): qty 150

## 2013-09-04 MED ORDER — DIGOXIN 0.0625 MG HALF TABLET
0.0625 mg | ORAL_TABLET | Freq: Every day | ORAL | Status: DC
  Administered 2013-09-05 – 2013-09-06 (×2): 0.0625 mg via ORAL
  Filled 2013-09-04 (×3): qty 1

## 2013-09-04 MED ORDER — METOLAZONE 2.5 MG PO TABS
2.5000 mg | ORAL_TABLET | Freq: Once | ORAL | Status: AC
  Administered 2013-09-04: 2.5 mg via ORAL
  Filled 2013-09-04: qty 1

## 2013-09-04 MED ORDER — WARFARIN SODIUM 7.5 MG PO TABS
7.5000 mg | ORAL_TABLET | ORAL | Status: DC
  Administered 2013-09-04: 7.5 mg via ORAL
  Filled 2013-09-04: qty 1

## 2013-09-04 MED ORDER — DEXTROSE 5 % IV SOLN
1.0000 g | Freq: Three times a day (TID) | INTRAVENOUS | Status: DC
  Administered 2013-09-04 – 2013-09-06 (×7): 1 g via INTRAVENOUS
  Filled 2013-09-04 (×9): qty 1

## 2013-09-04 MED ORDER — FUROSEMIDE 10 MG/ML IJ SOLN
80.0000 mg | Freq: Two times a day (BID) | INTRAMUSCULAR | Status: DC
  Administered 2013-09-04 – 2013-09-05 (×2): 80 mg via INTRAVENOUS
  Filled 2013-09-04 (×6): qty 8

## 2013-09-04 MED ORDER — FUROSEMIDE 10 MG/ML IJ SOLN
80.0000 mg | Freq: Once | INTRAMUSCULAR | Status: AC
  Administered 2013-09-04: 80 mg via INTRAVENOUS
  Filled 2013-09-04: qty 8

## 2013-09-04 MED ORDER — FUROSEMIDE 10 MG/ML IJ SOLN
INTRAMUSCULAR | Status: AC
  Filled 2013-09-04: qty 4

## 2013-09-04 MED ORDER — WARFARIN SODIUM 5 MG PO TABS
5.0000 mg | ORAL_TABLET | ORAL | Status: DC
  Filled 2013-09-04: qty 1

## 2013-09-04 MED ORDER — FUROSEMIDE 10 MG/ML IJ SOLN
40.0000 mg | Freq: Two times a day (BID) | INTRAMUSCULAR | Status: DC
  Administered 2013-09-04: 40 mg via INTRAVENOUS

## 2013-09-04 MED ORDER — PANTOPRAZOLE SODIUM 40 MG PO TBEC
40.0000 mg | DELAYED_RELEASE_TABLET | Freq: Every day | ORAL | Status: DC
Start: 2013-09-04 — End: 2013-09-14
  Administered 2013-09-04 – 2013-09-14 (×11): 40 mg via ORAL
  Filled 2013-09-04 (×12): qty 1

## 2013-09-04 NOTE — Telephone Encounter (Signed)
lmtcb x2 

## 2013-09-04 NOTE — Progress Notes (Addendum)
ANTICOAGULATION/ANTIBIOTIC CONSULT NOTE   Pharmacy Consult for Coumadin and Vancomycin and Zosyn Indication: atrial fibrillation and PNA  No Known Allergies  Patient Measurements: Height: 5\' 10"  (177.8 cm) Weight: 190 lb 14.7 oz (86.6 kg) IBW/kg (Calculated) : 73  Vital Signs: Temp: 98.9 F (37.2 C) (02/20 0741) Temp src: Axillary (02/20 0741) BP: 94/46 mmHg (02/20 0741) Pulse Rate: 85 (02/20 0741)  Labs:  Recent Labs  09/02/13 1409 09/02/13 1750 09/03/13 0458  HGB 12.2*  --  10.7*  HCT 36.1*  --  31.9*  PLT 165  --  151  LABPROT  --  19.0* 20.4*  INR  --  1.64* 1.80*  CREATININE 1.37*  --  1.90*    Estimated Creatinine Clearance: 36.8 ml/min (by C-G formula based on Cr of 1.9).  Assessment: AC: Warfarin PTA for afib. INR therapeutic. CBC stable. No bleeding noted. Home dose: 5 mg daily x 7.5 mg Tues, Fri.    ID: Antibiotics rebroadened today to Vancomycin and Cefepime Day#1 for HCAP. Tm 101.1. WBC 19.2. PCT 8.12. HIV, Hepatitis panel neg.   Vanc 2/18>>2/19; restart 2/20>> Zosyn 2/18>>2/19 Cefepime 2/20>> Roceph 2/19>>2/20 Azith 2/19>>2/20  2/18 Bld x2>>ngtd 2/19 Cdiff PCR neg RSV neg MRSA PCR neg   Goal of Therapy:  INR 2-3; Vancomycin trough 15-20 mcg/ml Monitor platelets by anticoagulation protocol: Yes   Plan:  1. Restart home coumadin 7.5mg  Tues and Fri; 5mg  all other days 2. Daily INR 3. Vancomycin 750mg  IV q12h 4. Cefepime 1gm IV q8h 5. F/u renal function, micro data, clinical condition, trough prn  Christoper Fabian, PharmD, BCPS Clinical pharmacist, pager [phone] 09/04/2013,8:39 AM

## 2013-09-04 NOTE — Progress Notes (Signed)
  I have seen and examined the patient, and reviewed the daily progress note by Rhea Pink, MS IV and discussed the care of the patient with them. Please see my progress note from 09/03/2013 for further details regarding assessment and plan. To note, need to clarify code status, noted DNI in MSIV note.   Signed:  Belia Heman, MD 09/04/2013, 9:14 AM

## 2013-09-04 NOTE — Care Management Note (Unsigned)
    Page 1 of 1   09/04/2013     4:17:09 PM   CARE MANAGEMENT NOTE 09/04/2013  Patient:  Gregg [name], Gregg [name]   Account Number:  [id number]  Date Initiated:  09/04/2013  Documentation initiated by:  GRAVES-BIGELOW,Parker Wherley  Subjective/Objective Assessment:   Pt admitted for worsening dry cough with vomiting. Pt is a continuous milrinone gtt. He is active with [facility] for RN services.     Action/Plan:   CM will continue to monitor for additional disposition needs.   Anticipated DC Date:  09/08/2013   Anticipated DC Plan:  HOME W HOME HEALTH SERVICES      DC Planning Services  CM consult      Choice offered to / List presented to:             Status of service:  In process, will continue to follow Medicare Important Message given?   (If response is "NO", the following Medicare IM given date fields will be blank) Date Medicare IM given:   Date Additional Medicare IM given:    Discharge Disposition:    Per UR Regulation:  Reviewed for med. necessity/level of care/duration of stay  If discussed at Long Length of Stay Meetings, dates discussed:    Comments:

## 2013-09-04 NOTE — Clinical Documentation Improvement (Signed)
  Patient admitted with CAP and Chronic Systolic CHF on home Milrinone received NS 36ml/hr 2/19. Transferred to [facility] for Bipap. "Patient with pro-BNP elevated at 5151 and possible pulmonary edema on chest xray. Pt with weight on admission of 185 above reported baseline dry wt 175-180lb. Current weight is 198lb. Episodes of desats to 70's. Neck: JVD - several cm above sternal notch and no carotid bruit; hard to appreciate JVD as it is believed that JVD is above the mandible. Crackles heard in bilateral lower bases and faint crackles in bilateral upper and mid lung fields, tympanic to percussion bilaterally." PO Lasix changed to IV.  Please Clarify in Notes and DC summary if this patient is being treated for decompensated CHF. Thank you.  Thank You, Beverley Fiedler ,RN Clinical Documentation Specialist:  [phone]  [facility]- Health Information Management

## 2013-09-04 NOTE — Progress Notes (Signed)
   CVP 13 Co-ox 65%  Suggests volume overload and normal cardiac output. Would continue IV lasix for diuresis as tolerated.   Gregg Pepperman,MD 4:56 PM

## 2013-09-04 NOTE — Progress Notes (Signed)
eLink Physician-Brief Progress Note Patient Name: Gregg [name] DOB: September 19, 1940 MRN: [id number]  Date of Service  09/04/2013   HPI/Events of Note   Increased work of breathing on BIPAP Multi-organ failure End stage CHF Family desires full code  eICU Interventions  Discussed with internal medicine teaching service; they will transfer to ICU Will have bedside MD clarify code status again with them ABG now   Intervention Category Major Interventions: Respiratory failure - evaluation and management  Aws Shere 09/04/2013, 6:28 PM

## 2013-09-04 NOTE — Progress Notes (Signed)
Patient removed BIPAP mask, got out of bed, and had witnessed descent to floor at about 0550. He became unresponsive to verbal stimulation and sternal rub until RN was able to re-administer BIPAP mask.  Patient quickly regained consciousness after oxygen administered. All details of incident reported to on-call MD and critical care team.  Patient remains confused post fall, but is verbal and following commands.  Order obtained for wrist restraints as patient continues to want to remove mask and get out of bed. Patient will continue to be monitored.

## 2013-09-04 NOTE — Progress Notes (Signed)
I discussed with family about Gregg [name] code status. Specifically, I explained to the wife, over the phone, that if Gregg [name] ends up on a ventilator, it will be very difficult for him to get weaned off due to his multiple medical issues. She verbalized understanding but in the she is not yet fully decided and therefore wishes him to be full code for now. I discussed with Dr Kendrick Fries. I will transfer him to the ICU as suggested earlier by Dr Molli Knock.

## 2013-09-04 NOTE — Consult Note (Signed)
Name: Gregg [name] MRN: [id number] DOB: 1941-07-10    ADMISSION DATE:  09/02/2013 CONSULTATION DATE:  09/04/2013  REFERRING MD :  Luciana Axe PRIMARY SERVICE: Internal Med  CHIEF COMPLAINT:  Dyspnea/Cough  BRIEF PATIENT DESCRIPTION: 73 year old male with CHF (LVEF 10-15%) admitted 2/18 for dyspnea, CAP vs CHF exacerbation. Hypoxic morning of 2/20 PCCM asked to see  SIGNIFICANT EVENTS / STUDIES:  09/16/2012 - Echo 2 > LVEF 10-15%. 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 Has also been evaluated recently by Dr. Delton Coombes in the office for persistent dry cough that started in the end of 2013. 2 weeks prior to admission he noted associated rhinorrhea, "sinus headache", and a sensation of post-nasal drip, though these associated symptoms have now improved. He notes some muscle and joint pains associated with vigorous coughing, and post-tussive emesis for the last 1-2 weeks, but no sick contacts, fevers, dyspnea, sinus tenderness, or chest pain. He notes no weight gain, LE edema, change in his baseline 2-pillow orthopnea, or PND. He notes compliance with home CPAP. He was admitted 2/18. 2/20 early am he had a coughing episode while ambulating to the restroom which caused . He is currently requiring bipap.     PAST MEDICAL HISTORY :  Past Medical History  Diagnosis Date  . ICD (implantable cardiac defibrillator) in place   . GERD (gastroesophageal reflux disease)   . Chronic kidney disease   . Anemia   . Pacemaker   . CHF (congestive heart failure)   . Sleep apnea     uses CPAP  . Dysrhythmia   . Pneumonia 08/2013   Past Surgical History    Procedure Laterality Date  . Insert / replace / remove pacemaker    . Colon surgery      ruptured colon  . Tee without cardioversion N/A 09/16/2012    Procedure: TRANSESOPHAGEAL ECHOCARDIOGRAM (TEE);  Surgeon: Dolores Patty, MD;  Location: [facility] ENDOSCOPY;  Service: Cardiovascular;  Laterality: N/A;  . Cardioversion N/A 09/16/2012    Procedure: CARDIOVERSION;  Surgeon: Dolores Patty, MD;  Location: [facility] ENDOSCOPY;  Service: Cardiovascular;  Laterality: N/A;  . Peripherally inserted central catheter insertion     Prior to Admission medications   Medication Sig Start Date End Date Taking? Authorizing Provider  acetaminophen (TYLENOL) 650 MG CR tablet Take 650 mg by mouth 2 (two) times daily as needed for pain.   Yes Historical Provider, MD  amiodarone (PACERONE) 400 MG tablet Take 200 mg by mouth daily. 10/16/12  Yes Amy D Clegg, NP  aspirin EC 81 MG tablet Take 81 mg by mouth daily.   Yes Historical Provider, MD  B Complex-Biotin-FA (HM VITAMIN B100 COMPLEX PO) Take 1,000 mg by mouth daily with lunch.   Yes Historical Provider, MD  carvedilol (COREG) 6.25 MG tablet Take 3.125 mg by mouth at bedtime.   Yes Historical Provider, MD  cetirizine (ZYRTEC) 10 MG tablet Take 10 mg by mouth daily.   Yes Historical Provider, MD  digoxin (LANOXIN) 0.125 MG tablet Take 1 tablet (0.125 mg total) by mouth daily. 04/16/13 04/16/14 Yes Dolores Patty, MD  docusate sodium (DULCOLAX) 100 MG capsule Take 100 mg by mouth at bedtime.   Yes Historical Provider, MD  Fe Fum-FA-B Cmp-C-Zn-Mg-Mn-Cu (FERROCITE PLUS PO) Take 1 tablet by mouth daily with lunch.    Yes Historical Provider, MD  furosemide (LASIX) 40 MG tablet Take 40 mg by mouth 2 (two) times daily.   Yes Historical Provider, MD  hydrocortisone cream 1 % Apply 1 application topically 2 (two) times daily as needed (for itching or tape irritation).    Yes Historical Provider, MD  losartan (COZAAR) 25 MG tablet Take 1 tablet (25 mg total) by mouth daily.  08/19/13  Yes Dolores Pattyaniel R Bensimhon, MD  magnesium oxide (MAG-OX) 400 MG tablet Take 800-1,200 mg by mouth daily. 2 tablets (800 mg) every morning and 3 tablets (1200 mg) at lunch   Yes Historical Provider, MD  Multiple Vitamins-Minerals (MULTIVITAMIN WITH MINERALS) tablet Take 1 tablet by mouth daily with lunch.    Yes Historical Provider, MD  pyridOXINE (VITAMIN B-6) 100 MG tablet Take 100 mg by mouth daily with lunch.    Yes Historical Provider, MD  senna (SENOKOT) 8.6 MG TABS Take 1 tablet by mouth at bedtime.    Yes Historical Provider, MD  sildenafil (REVATIO) 20 MG tablet Take 1 tablet (20 mg total) by mouth 3 (three) times daily. 02/18/13 02/18/14 Yes Amy D Clegg, NP  sodium chloride 0.9 % SOLN with milrinone 1 MG/ML SOLN 200 mcg/mL Inject 0.25 mcg/kg/min into the vein continuous. Dose verified from [facility] IV bag Concentration: 1mg /mL for total volume for 220mg  in 220mL Dosing wt: 81.8 kg Dose: 1.2 mL/hr   Yes Historical Provider, MD  warfarin (COUMADIN) 5 MG tablet Take 5-7.5 mg by mouth daily. 1 1/2 tablets (7.5mg ) Tuesdays and Fridays, 1 tablet (5 mg) on Sunday, Monday, Wednesday, Thursday, Saturday   Yes Historical Provider, MD   No Known Allergies  FAMILY HISTORY:  Family History  Problem Relation Age of Onset  . Allergies Sister   . Heart disease Brother    SOCIAL HISTORY:  reports that he quit smoking about 30 years ago. His smoking use included Cigarettes. He has a 20 pack-year smoking history. He has never used smokeless tobacco. He reports that he does not drink alcohol or use illicit drugs.  REVIEW OF SYSTEMS:  Difficult due to BiPAP Bolds are positive  Constitutional: weight loss, gain, night sweats, Fevers, chills, fatigue .  HEENT: headaches, Sore throat, sneezing, nasal congestion, post nasal drip, Difficulty swallowing, Tooth/dental problems, visual complaints visual changes, ear ache CV:  chest pain, radiates: ,Orthopnea, PND, swelling in lower extremities,  dizziness, palpitations, syncope.  GI  heartburn, indigestion, abdominal pain, nausea, vomiting, diarrhea, change in bowel habits, loss of appetite, bloody stools.  Resp: cough, productive: , hemoptysis, dyspnea, chest pain, pleuritic.  Skin: rash or itching or icterus GU: dysuria, change in color of urine, urgency or frequency. flank pain, hematuria  MS: joint pain or swelling. decreased range of motion  Psych: change in mood or affect. depression or anxiety.  Neuro: difficulty with speech, weakness, numbness, ataxia   SUBJECTIVE:   VITAL SIGNS: Temp:  [97.5 F (36.4 C)-101.1 F (38.4 C)] 98.9 F (37.2 C) (02/20 0741) Pulse Rate:  [77-106] 85 (02/20 0741) Resp:  [22-44] 32 (02/20 0741) BP: (94-125)/(46-72) 94/46 mmHg (02/20 0741) SpO2:  [79 %-95 %] 95 % (02/20 0741) FiO2 (%):  [100 %] 100 % (02/20 0731) Weight:  [86.6 kg (190 lb 14.7 oz)] 86.6 kg (190 lb 14.7 oz) (02/20  6314) HEMODYNAMICS:   VENTILATOR SETTINGS: Vent Mode:  [-] BIPAP FiO2 (%):  [100 %] 100 % INTAKE / OUTPUT: Intake/Output     02/19 0701 - 02/20 0700 02/20 0701 - 02/21 0700   P.O. 160    I.V. (mL/kg) 111.5 (1.3)    IV Piggyback 50    Total Intake(mL/kg) 321.5 (3.7)    Urine (mL/kg/hr) 150 (0.1)    Total Output 150     Net +171.5          Stool Occurrence 1 x      PHYSICAL EXAMINATION: General:  Acutely ill appearing male, comfortable on BiPAP Neuro:  Alert, oriented x 3.  HEENT:  Bee/AT, no jvd noted Cardiovascular:  RRR Lungs:  Crackles throughout Abdomen: soft, non-tender, non-distended Musculoskeletal:  Intact, moves all extremities.  Skin:  Intact  LABS:  CBC  Recent Labs Lab 09/02/13 1409 09/03/13 0458  WBC 14.2* 20.9*  HGB 12.2* 10.7*  HCT 36.1* 31.9*  PLT 165 151   Coag's  Recent Labs Lab 09/02/13 1750 09/03/13 0458  INR 1.64* 1.80*   BMET  Recent Labs Lab 09/02/13 1409 09/03/13 0458  NA 140 137  K 3.9 3.7  CL 100 98  CO2 26 23  BUN 18 26*  CREATININE 1.37*  1.90*  GLUCOSE 109* 112*   Electrolytes  Recent Labs Lab 09/02/13 1409 09/03/13 0458  CALCIUM 10.6* 9.9   Sepsis Markers No results found for this basename: LATICACIDVEN, PROCALCITON, O2SATVEN,  in the last 168 hours ABG No results found for this basename: PHART, PCO2ART, PO2ART,  in the last 168 hours Liver Enzymes  Recent Labs Lab 09/03/13 0458  AST 68*  ALT 33  ALKPHOS 57  BILITOT 2.2*  ALBUMIN 3.1*   Cardiac Enzymes  Recent Labs Lab 09/02/13 1409  PROBNP 5151.0*   Glucose No results found for this basename: GLUCAP,  in the last 168 hours  Imaging Dg Chest 2 View  09/02/2013   CLINICAL DATA:  Cough and emesis.  Joint pain  EXAM: CHEST  2 VIEW  COMPARISON:  07/21/2013  FINDINGS: Stable appearance of dual-chamber left approach ICD/pacer. Right IJ central venous catheter is in stable position. Cardiomegaly which is also stable. New perihilar airspace disease, most dense on the right. No effusion or interstitial coarsening. No pneumothorax. No acute osseous findings.  IMPRESSION: Perihilar airspace disease is concerning for pneumonia given the focal appearance. Pulmonary edema can also have this appearance.   Electronically Signed   By: Tiburcio Pea M.D.   On: 09/02/2013 14:13     CXR: 2/20 > bilateral airspace opacification - extends throughout entire lung fields.   ASSESSMENT / PLAN:  A:  Acute Respiratory Failure - 2/2 pulmonary edema. He has a known history of advanced systolic CHF, on milrinone drip at home. BNP on admission >5000.  May be some CAP component as well given history and leukocytosis.  Plan:   Continue BiPAP Low threshold for intubation in setting of respiratory decompensation.  Rec discussing goals of care with family now, in case intubation is required.  Change to IV Lasix 40 mg BID, first dose now and assess response.  F/u CXR in Am Check PCT  Continue antibiotics Recommend consulting cardiology/heart failure, as this acute episode is  likely related to CHF.   Joneen Roach, ACNP [facility] Pager [phone] or [phone]  09/04/2013, 8:56 AM  Saturation of 94% on 100% FiO2 via BiPAP but patient appears very comfortable.  This is due to pulmonary  edema from heart failure.  [name] SBP is 105 which precludes aggressive diureses.  Also has renal failure that I imagine will only get worse.  If any decompensation occurs, however minute, the patient will need intubation.  However, given over all health I approached the topic of goals of care and patient wanted his wife present for the discussion.  Given that, continue BiPAP for now, recommend changing lasix to IV.  May ultimately need a drip.  If upon arrival, the family wishes for full support then will likely need to move to the ICU.  Recommend calling the heart failure team for consultation and palliative care involvement.  Will come check on patient again later on today.  CC time 45 min.  Patient seen and examined, agree with above note.  I dictated the care and orders written for this patient under my direction.  Alyson Reedy, MD [phone]

## 2013-09-04 NOTE — Progress Notes (Signed)
  Date: 09/04/2013  Patient name: Gregg [name]  Medical record number: [id number]  Date of birth: 06/26/1941   This patient has been seen and the plan of care was discussed with the house staff. Please see their note for complete details. I concur with their findings with the following additions/corrections:  Have broadened antibiotics with tenuous state with fever, elevated WBC.  Appreciate heart failure team input.  Patient with cognitive delay as well.    Gardiner Barefoot, MD 09/04/2013, 2:07 PM

## 2013-09-04 NOTE — Consult Note (Addendum)
Advanced Heart Failure Team Consult Note  Referring Physician: CCM Primary Physician: Dr. Allene Dillonorres (manages INR) Primary Cardiologist:  Dr. Gala RomneyBensimhon  Reason for Consultation: HF  HPI:    Gregg [name] is a 73 y.o. 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 We felt not a good candidate for LVAD d/t congnitive issues and  sought a second opinion with Dr Adriana Simasook in [place]. On 04/02/12 underwent neurocognitive testing.and is not a candidate for advanced therapies due to results for neurocognitive results.   ECHO 09/2012: EF 10-15% RV moderately HK moderate MR  Has been on home milrinone since 2013 and tolerated well. Dry weight is about 180 lbs. Presented to the hospital 2/18 with complaints of worsening nonproductive cough which is worse at night . He was admitted for possible CAP. Since stay O2 down to 80% and had to increase O2. Placed on abx. Pro-BNP on admission 5151, Cr 1.37 and dig level 1.0. Weight  190 lbs.  CXR bilateral airspace disease ?CHF vs PNA. PCT > 8. WBC 19k   Review of Systems: [y] = yes, [ ]  = no   General: Weight gain Cove.Etienne[y ]; Weight loss [ ] ; Anorexia [ ] ; Fatigue Cove.Etienne[y ]; Fever [ ] ; Chills [ ] ; Weakness [ ]   Cardiac: Chest pain/pressure [ ] ; Resting SOB Cove.Etienne[y ]; Exertional SOB Cove.Etienne[y ]; Orthopnea [ y]; Pedal Edema [ ] ; Palpitations [ ] ; Syncope [ ] ; Presyncope [ ] ; Paroxysmal nocturnal dyspnea[y ]  Pulmonary: Cough Cove.Etienne[y ]; Wheezing[ ] ; Hemoptysis[ ] ; Sputum [ ] ; Snoring [ ]   GI: Vomiting[ ] ; Dysphagia[ ] ; Melena[ ] ; Hematochezia [ ] ; Heartburn[ ] ; Abdominal pain [ ] ; Constipation [ ] ; Diarrhea [ ] ; BRBPR [ ]   GU: Hematuria[ ] ; Dysuria [ ] ; Nocturia[ ]   Vascular: Pain in legs with walking [ ] ; Pain in feet with lying flat [ ] ;  Non-healing sores [ ] ; Stroke [ ] ; TIA [ ] ; Slurred speech [ ] ;  Neuro: Headaches[ ] ; Vertigo[ ] ; Seizures[ ] ; Paresthesias[ ] ;Blurred vision [ ] ; Diplopia [ ] ; Vision changes [ ]   Ortho/Skin: Arthritis [ ] ; Joint pain Cove.Etienne[y ]; Muscle pain [ ] ; Joint swelling [ ] ; Back Pain [ ] ; Rash [ ]   Psych: Depression[ ] ; Anxiety[ ]   Heme: Bleeding problems [ ] ; Clotting disorders [ ] ; Anemia [ ]   Endocrine: Diabetes [ ] ; Thyroid dysfunction[ ]   Home Medications Prior to Admission medications   Medication Sig Start Date End Date Taking? Authorizing Provider  acetaminophen (TYLENOL) 650 MG CR tablet Take 650 mg by mouth 2 (two) times daily as needed for pain.   Yes Historical Provider, MD  amiodarone (PACERONE) 400 MG tablet Take 200 mg by mouth daily. 10/16/12  Yes Amy D Clegg, NP  aspirin EC 81 MG tablet Take 81 mg by mouth daily.   Yes Historical Provider, MD  B Complex-Biotin-FA (HM VITAMIN B100 COMPLEX PO) Take 1,000 mg by mouth daily with lunch.   Yes Historical Provider, MD  carvedilol (COREG) 6.25 MG tablet Take 3.125 mg by mouth at bedtime.   Yes Historical Provider, MD  cetirizine (ZYRTEC) 10 MG tablet Take 10 mg by mouth daily.   Yes Historical Provider, MD  digoxin (LANOXIN) 0.125 MG tablet Take 1 tablet (0.125 mg total) by mouth daily. 04/16/13 04/16/14  Yes Dolores Patty, MD  docusate sodium (DULCOLAX) 100 MG capsule Take 100 mg by mouth at bedtime.   Yes Historical Provider, MD  Fe Fum-FA-B Cmp-C-Zn-Mg-Mn-Cu (FERROCITE PLUS PO) Take 1 tablet by mouth daily with lunch.    Yes Historical Provider, MD  furosemide (LASIX) 40 MG tablet Take 40 mg by mouth 2 (two) times daily.   Yes Historical Provider, MD  hydrocortisone cream 1 % Apply 1 application topically 2 (two) times daily as needed (for itching or tape irritation).    Yes Historical Provider, MD  losartan (COZAAR) 25 MG tablet Take 1 tablet (25 mg total) by mouth daily. 08/19/13  Yes Dolores Patty, MD  magnesium oxide (MAG-OX) 400 MG  tablet Take 800-1,200 mg by mouth daily. 2 tablets (800 mg) every morning and 3 tablets (1200 mg) at lunch   Yes Historical Provider, MD  Multiple Vitamins-Minerals (MULTIVITAMIN WITH MINERALS) tablet Take 1 tablet by mouth daily with lunch.    Yes Historical Provider, MD  pyridOXINE (VITAMIN B-6) 100 MG tablet Take 100 mg by mouth daily with lunch.    Yes Historical Provider, MD  senna (SENOKOT) 8.6 MG TABS Take 1 tablet by mouth at bedtime.    Yes Historical Provider, MD  sildenafil (REVATIO) 20 MG tablet Take 1 tablet (20 mg total) by mouth 3 (three) times daily. 02/18/13 02/18/14 Yes Amy D Clegg, NP  sodium chloride 0.9 % SOLN with milrinone 1 MG/ML SOLN 200 mcg/mL Inject 0.25 mcg/kg/min into the vein continuous. Dose verified from [facility] IV bag Concentration: 1mg /mL for total volume for 220mg  in Dosing wt: 81.8 kg Dose: 1.2 mL/hr   Yes Historical Provider, MD  warfarin (COUMADIN) 5 MG tablet Take 5-7.5 mg by mouth daily. 1 1/2 tablets (7.5mg ) Tuesdays and Fridays, 1 tablet (5 mg) on Sunday, Monday, Wednesday, Thursday, Saturday   Yes Historical Provider, MD    Past Medical History: Past Medical History  Diagnosis Date  . ICD (implantable cardiac defibrillator) in place   . GERD (gastroesophageal reflux disease)   . Chronic kidney disease   . Anemia   . Pacemaker   . CHF (congestive heart failure)   . Sleep apnea     uses CPAP  . Dysrhythmia   . Pneumonia 08/2013    Past Surgical History: Past Surgical History  Procedure Laterality Date  . Insert / replace / remove pacemaker    . Colon surgery      ruptured colon  . Tee without cardioversion N/A 09/16/2012    Procedure: TRANSESOPHAGEAL ECHOCARDIOGRAM (TEE);  Surgeon: Dolores Patty, MD;  Location[facility] ENDOSCOPY;  Service: Cardiovascular;  Laterality: N/A;  . Cardioversion N/A 09/16/2012    Procedure: CARDIOVERSION;  Surgeon: Dolores Patty, MD;  Location[facility] ENDOSCOPY;  Service: Cardiovascular;  Laterality:  N/A;  . Peripherally inserted central catheter insertion      Family History: Family History  Problem Relation Age of Onset  . Allergies Sister   . Heart disease Brother     Social History: History   Social History  . Marital Status: Married    Spouse Name: N/A    Number of Children: N/A  . Years of Education: N/A   Occupational History  . Retired    Social History Main Topics  . Smoking status: Former Smoker -- 1.00 packs/day for 20 years    Types: Cigarettes    Quit date: 07/17/1983  . Smokeless tobacco: Never Used  . Alcohol Use: No  . Drug  Use: No  . Sexual Activity: Yes   Other Topics Concern  . None   Social History Narrative  . None    Allergies:  No Known Allergies  Objective:    Vital Signs:   Temp:  [97.5 F (36.4 C)-101.1 F (38.4 C)] 98.9 F (37.2 C) (02/20 0741) Pulse Rate:  [82-106] 82 (02/20 1118) Resp:  [22-44] 27 (02/20 1118) BP: (94-125)/(46-72) 106/54 mmHg (02/20 1118) SpO2:  [79 %-95 %] 95 % (02/20 1118) FiO2 (%):  [100 %] 100 % (02/20 1118) Weight:  [190 lb 14.7 oz (86.6 kg)] 190 lb 14.7 oz (86.6 kg) (02/20 0355) Last BM Date: 09/03/13  Weight change: Filed Weights   09/03/13 0500 09/03/13 2223 09/04/13 0355  Weight: 198 lb 1.6 oz (89.858 kg) 190 lb 14.7 oz (86.6 kg) 190 lb 14.7 oz (86.6 kg)    Intake/Output:   Intake/Output Summary (Last 24 hours) at 09/04/13 1249 Last data filed at 09/04/13 1003  Gross per 24 hour  Intake 324.53 ml  Output    150 ml  Net 174.53 ml     Physical Exam: General:  Ill appearing. On bipap HEENT: normal Neck: supple. JVP to jaw . Carotids 2+ bilat; no bruits. No lymphadenopathy or thryomegaly appreciated. Cor: PMI laterally displaced. No S3. Regular rate & rhythm. R upper chest hickman. No rubs. 2/6 MR Lungs: crackles throughout Abdomen: soft, nontender, + distended. No hepatosplenomegaly. No bruits or masses. Good bowel sounds. Extremities: no cyanosis, clubbing, rash, edema Neuro:  alert & orientedx3, cranial nerves grossly intact. moves all 4 extremities w/o difficulty. Affect pleasant  Telemetry: SR 80s  Labs: Basic Metabolic Panel:  Recent Labs Lab 09/02/13 1409 09/03/13 0458 09/04/13 0501  NA 140 137 139  K 3.9 3.7 3.8  CL 100 98 101  CO2 26 23 24   GLUCOSE 109* 112* 133*  BUN 18 26* 37*  CREATININE 1.37* 1.90* 1.65*  CALCIUM 10.6* 9.9 10.0    Liver Function Tests:  Recent Labs Lab 09/03/13 0458  AST 68*  ALT 33  ALKPHOS 57  BILITOT 2.2*  PROT 6.4  ALBUMIN 3.1*   No results found for this basename: LIPASE, AMYLASE,  in the last 168 hours No results found for this basename: AMMONIA,  in the last 168 hours  CBC:  Recent Labs Lab 09/02/13 1409 09/03/13 0458 09/04/13 0501  WBC 14.2* 20.9* 19.2*  NEUTROABS 10.4*  --   --   HGB 12.2* 10.7* 11.1*  HCT 36.1* 31.9* 32.5*  MCV 92.6 92.7 91.3  PLT 165 151 180    Cardiac Enzymes: No results found for this basename: CKTOTAL, CKMB, CKMBINDEX, TROPONINI,  in the last 168 hours  BNP: BNP (last 3 results)  Recent Labs  07/21/13 1159 09/02/13 1409  PROBNP 1898.0* 5151.0*    CBG: No results found for this basename: GLUCAP,  in the last 168 hours  Coagulation Studies:  Recent Labs  09/02/13 1750 09/03/13 0458 09/04/13 0501  LABPROT 19.0* 20.4* 24.1*  INR 1.64* 1.80* 2.24*     Imaging: Dg Chest 2 View  09/02/2013   CLINICAL DATA:  Cough and emesis.  Joint pain  EXAM: CHEST  2 VIEW  COMPARISON:  07/21/2013  FINDINGS: Stable appearance of dual-chamber left approach ICD/pacer. Right IJ central venous catheter is in stable position. Cardiomegaly which is also stable. New perihilar airspace disease, most dense on the right. No effusion or interstitial coarsening. No pneumothorax. No acute osseous findings.  IMPRESSION: Perihilar airspace disease is concerning for pneumonia  given the focal appearance. Pulmonary edema can also have this appearance.   Electronically Signed   By: Tiburcio Pea M.D.   On: 09/02/2013 14:13   Dg Chest Port 1 View  09/04/2013   CLINICAL DATA:  Evaluate pulmonary edema ; leukocytosis, fever  EXAM: PORTABLE CHEST - 1 VIEW  COMPARISON:  Portable exam 0907 hr compared to 09/02/2013  FINDINGS: Right jugular central venous catheter with tip projecting over SVC.  Left subclavian AICD leads project over right atrium and right ventricle.  Enlargement of cardiac silhouette with pulmonary vascular congestion.  Diffuse bilateral airspace infiltrates significantly increased since previous exam could represent pulmonary edema or pneumonia.  No gross pleural effusion or pneumothorax.  IMPRESSION: Enlargement of cardiac silhouette with pulmonary vascular congestion post AICD.  Significantly increased bilateral airspace infiltrates which could represent pulmonary edema or pneumonia.   Electronically Signed   By: Ulyses Southward M.D.   On: 09/04/2013 09:21      Medications:     Current Medications: . amiodarone  200 mg Oral Daily  . aspirin EC  81 mg Oral Daily  . benzonatate  200 mg Oral TID  . carvedilol  3.125 mg Oral QHS  . ceFEPime (MAXIPIME) IV  1 g Intravenous 3 times per day  . digoxin  0.125 mg Oral Daily  . ferrous fumarate-b12-vitamic C-folic acid  1 capsule Oral Q lunch  . furosemide      . furosemide  40 mg Intravenous BID  . guaiFENesin  600 mg Oral BID  . loratadine  10 mg Oral Daily  . magnesium oxide  1,000 mg Oral BID  . pantoprazole  40 mg Oral Daily  . pyridOXINE  100 mg Oral Q lunch  . sildenafil  20 mg Oral TID  . sodium chloride  3 mL Intravenous Q12H  . vancomycin  750 mg Intravenous Q12H  . [START ON 09/05/2013] warfarin  5 mg Oral Once per day on Sun Mon Wed Thu Sat  . warfarin  7.5 mg Oral Once  . warfarin  7.5 mg Oral Once per day on Tue Fri  . warfarin  1 each Does not apply Once  . Warfarin - Pharmacist Dosing Inpatient   Does not apply q1800     Infusions: . milrinone 0.25 mcg/kg/min (09/04/13 0158)      Assessment:    1) A/C systolic HF - EF 50-72% (09/2012) - on home milrinone 0.25 2) A/C respiratory failure 3) CAP ? 4) Afib s/p DC-CV 3/14 - on Coumadin 5) DNR  Plan/Discussion:    Mr. Ansari is well known to the HF team. He has a long-standing history of chronic systolic HF with EF 10-15% and has been on home milrinone since 2013. Unfortunately he is not a transplant/VAD candidate d/t age and cognitive issues.   His volume status appears elevated. He did not have much UOP with lasix 40 mg. Will increase to 80 mg IV BID. Will hook up CVPs to [facility] for hemodynamics and assess co-ox. Stop BB.  Continue abx and awaiting blood cultures.  Agree with goals of care discussions.   Length of Stay: 2  Aundria Rud NP-C 09/04/2013, 12:49 PM  Advanced Heart Failure Team Pager [phone] (M-F; 7a - 4p)  Please contact [facility] for night-coverage after hours (4p -7a ) and weekends on [email or web address]  Patient seen and examined with Ulla Potash, NP. We discussed all aspects of the encounter. I agree with the assessment and plan as stated above.  Given elevated WBC and PCT suspect main issue may be PNA. However weight and BNP are elevated over baseline. Agree with trial of IV diuresis. Will check CVP and co-ox to help further guide decision making as exam difficult. Continue home milrinone.   In NSR. Continue coumadin.   Appreciate PCCM and primary care team's care.   Brooklen Runquist,MD 4:02 PM

## 2013-09-04 NOTE — Progress Notes (Addendum)
Spoke to patient and his wife concerning [name] goals of care. We discussed his poor long-term prognosis and the severity of his current illness. We discussed the potential role of invasive mechanical ventilation and after considering everything he has decided that he would like to continue our current interventions and be made DNR/DNI. He and his wife would still like to be transferred to the ICU for more aggressive nursing care.   CRITICAL CARE: The patient is critically ill with multiple organ systems failure and requires high complexity decision making for assessment and support, frequent evaluation and titration of therapies, application of advanced monitoring technologies and extensive interpretation of multiple databases. Critical Care Time devoted to patient care services described in this note is 30 minutes.

## 2013-09-04 NOTE — Progress Notes (Signed)
Medical Student Daily Progress Note  Subjective: Patient had desaturation with coughing on multiple occasions that lead to use of BiPAP and being on 15L of oxygen. Patient also had a fall at night and reported being confused during the episode. Patient had no injuries from the fall.   Fever of 101 overnight despite having been on antibiotics for one day.   Patient had 4 loose stools, but C diff PCR was negative.   Code Status: Wife will be coming to the hospital in PM and wants to be updated on husband's hospital course and talk with patient and med team about code status. Currently full code until discussion is complete.   Objective: Vital signs in last 24 hours: Filed Vitals:   09/04/13 0355 09/04/13 0731 09/04/13 0741 09/04/13 1118  BP: 109/59 110/57 94/46 106/54  Pulse: 88  85 82  Temp: 97.5 F (36.4 C)  98.9 F (37.2 C) 98.7 F (37.1 C)  TempSrc: Axillary  Axillary Axillary  Resp: 34 28 32 27  Height:      Weight: 86.6 kg (190 lb 14.7 oz)     SpO2: 95%  95% 95%   Weight change: 2.458 kg (5 lb 6.7 oz)  Intake/Output Summary (Last 24 hours) at 09/04/13 1323 Last data filed at 09/04/13 1003  Gross per 24 hour  Intake 164.53 ml  Output    150 ml  Net  14.53 ml   Physical Exam: BP 106/54  Pulse 82  Temp(Src) 98.7 F (37.1 C) (Axillary)  Resp 27  Ht 5' 10"  (1.778 m)  Wt 86.6 kg (190 lb 14.7 oz)  BMI 27.39 kg/m2  SpO2 95% General appearance: alert and on BiPAP, lying in bed Lungs: Crackles heard in bilateral lung bases Heart: Does not hear both S1 and S2, but one heart sound per pulse, regular rate, ICD placed in left chest Abdomen: soft, non-tender; bowel sounds normal; no masses,  no organomegaly Extremities: no pedal edema Pulses: 2+ and symmetric Lab Results: BMET    Component Value Date/Time   NA 139 09/04/2013 0501   K 3.8 09/04/2013 0501   CL 101 09/04/2013 0501   CO2 24 09/04/2013 0501   GLUCOSE 133* 09/04/2013 0501   BUN 37* 09/04/2013 0501   CREATININE  1.65* 09/04/2013 0501   CALCIUM 10.0 09/04/2013 0501   GFRNONAA 40* 09/04/2013 0501   GFRAA 47* 09/04/2013 0501    CBC    Component Value Date/Time   WBC 19.2* 09/04/2013 0501   RBC 3.56* 09/04/2013 0501   HGB 11.1* 09/04/2013 0501   HCT 32.5* 09/04/2013 0501   PLT 180 09/04/2013 0501   MCV 91.3 09/04/2013 0501   MCH 31.2 09/04/2013 0501   MCHC 34.2 09/04/2013 0501   RDW 15.3 09/04/2013 0501   LYMPHSABS 1.8 09/02/2013 1409   MONOABS 1.8* 09/02/2013 1409   EOSABS 0.2 09/02/2013 1409   BASOSABS 0.0 09/02/2013 1409    Procalcitonin: 8.12  C diff: negative PCR MRSA: negative  Lab Results  Component Value Date   HEPAIGM NON REACTIVE 09/03/2013   HEPBIGM NON REACTIVE 09/03/2013     Lab Results  Component Value Date   INR 2.24* 09/04/2013   INR 1.80* 09/03/2013   INR 1.64* 09/02/2013    Micro Results: Recent Results (from the past 240 hour(s))  CULTURE, BLOOD (ROUTINE X 2)     Status: None   Collection Time    09/02/13  9:45 PM      Result Value Ref Range Status  Specimen Description BLOOD RIGHT ARM   Final   Special Requests BOTTLES DRAWN AEROBIC AND ANAEROBIC 10CC EACH   Final   Culture  Setup Time     Final   Value: 09/03/2013 00:33     Performed at [facility]   Culture     Final   Value:        BLOOD CULTURE RECEIVED NO GROWTH TO DATE CULTURE WILL BE HELD FOR 5 DAYS BEFORE ISSUING A FINAL NEGATIVE REPORT     Performed at [facility]   Report Status PENDING   Incomplete  CULTURE, BLOOD (ROUTINE X 2)     Status: None   Collection Time    09/02/13  9:55 PM      Result Value Ref Range Status   Specimen Description BLOOD RIGHT HAND   Final   Special Requests BOTTLES DRAWN AEROBIC ONLY 10CC   Final   Culture  Setup Time     Final   Value: 09/03/2013 00:33     Performed at [facility]   Culture     Final   Value:        BLOOD CULTURE RECEIVED NO GROWTH TO DATE CULTURE WILL BE HELD FOR 5 DAYS BEFORE ISSUING A FINAL NEGATIVE REPORT     Performed at  [facility]   Report Status PENDING   Incomplete  RESPIRATORY VIRUS PANEL     Status: None   Collection Time    09/02/13  9:55 PM      Result Value Ref Range Status   Source - RVPAN NASAL SWAB   Corrected   Comment: CORRECTED ON 02/20 AT 0923: PREVIOUSLY REPORTED AS NASAL SWAB   Respiratory Syncytial Virus A NOT DETECTED   Final   Respiratory Syncytial Virus B NOT DETECTED   Final   Influenza A NOT DETECTED   Final   Influenza B NOT DETECTED   Final   Parainfluenza 1 NOT DETECTED   Final   Parainfluenza 2 NOT DETECTED   Final   Parainfluenza 3 NOT DETECTED   Final   Metapneumovirus NOT DETECTED   Final   Rhinovirus NOT DETECTED   Final   Adenovirus NOT DETECTED   Final   Influenza A H1 NOT DETECTED   Final   Influenza A H3 NOT DETECTED   Final   Comment: (NOTE)           Normal Reference Range for each Analyte: NOT DETECTED     Testing performed using the Luminex xTAG Respiratory Viral Panel test     kit.     This test was developed and its performance characteristics determined     by [facility]. It has not been cleared or approved by the [place]     Food and Drug Administration. This test is used for clinical purposes.     It should not be regarded as investigational or for research. This     laboratory is certified under the [place] (CLIA) as qualified to perform high complexity     clinical laboratory testing.     Performed at [facility] DIFFICILE BY PCR     Status: None   Collection Time    09/03/13  5:47 PM      Result Value Ref Range Status   C difficile by pcr NEGATIVE  NEGATIVE Final  MRSA PCR SCREENING     Status: None  Collection Time    09/03/13 10:34 PM      Result Value Ref Range Status   MRSA by PCR NEGATIVE  NEGATIVE Final   Comment:            The GeneXpert MRSA Assay (FDA     approved for NASAL specimens     only), is one component of a     comprehensive MRSA  colonization     surveillance program. It is not     intended to diagnose MRSA     infection nor to guide or     monitor treatment for     MRSA infections.   Studies/Results: Dg Chest 2 View  09/02/2013   CLINICAL DATA:  Cough and emesis.  Joint pain  EXAM: CHEST  2 VIEW  COMPARISON:  07/21/2013  FINDINGS: Stable appearance of dual-chamber left approach ICD/pacer. Right IJ central venous catheter is in stable position. Cardiomegaly which is also stable. New perihilar airspace disease, most dense on the right. No effusion or interstitial coarsening. No pneumothorax. No acute osseous findings.  IMPRESSION: Perihilar airspace disease is concerning for pneumonia given the focal appearance. Pulmonary edema can also have this appearance.   Electronically Signed   By: Jorje Guild M.D.   On: 09/02/2013 14:13   Dg Chest Port 1 View  09/04/2013   CLINICAL DATA:  Evaluate pulmonary edema ; leukocytosis, fever  EXAM: PORTABLE CHEST - 1 VIEW  COMPARISON:  Portable exam 0907 hr compared to 09/02/2013  FINDINGS: Right jugular central venous catheter with tip projecting over SVC.  Left subclavian AICD leads project over right atrium and right ventricle.  Enlargement of cardiac silhouette with pulmonary vascular congestion.  Diffuse bilateral airspace infiltrates significantly increased since previous exam could represent pulmonary edema or pneumonia.  No gross pleural effusion or pneumothorax.  IMPRESSION: Enlargement of cardiac silhouette with pulmonary vascular congestion post AICD.  Significantly increased bilateral airspace infiltrates which could represent pulmonary edema or pneumonia.   Electronically Signed   By: Lavonia Dana M.D.   On: 09/04/2013 09:21   Medications: I have reviewed the patient's current medications. Scheduled Meds: . amiodarone  200 mg Oral Daily  . aspirin EC  81 mg Oral Daily  . benzonatate  200 mg Oral TID  . ceFEPime (MAXIPIME) IV  1 g Intravenous 3 times per day  . [START ON  09/05/2013] digoxin  0.0625 mg Oral Daily  . ferrous [id number] C-folic acid  1 capsule Oral Q lunch  . furosemide      . furosemide  80 mg Intravenous Once  . furosemide  80 mg Intravenous BID  . guaiFENesin  600 mg Oral BID  . loratadine  10 mg Oral Daily  . magnesium oxide  1,000 mg Oral BID  . metolazone  2.5 mg Oral Once  . pantoprazole  40 mg Oral Daily  . pyridOXINE  100 mg Oral Q lunch  . sildenafil  20 mg Oral TID  . sodium chloride  3 mL Intravenous Q12H  . vancomycin  750 mg Intravenous Q12H  . [START ON 09/05/2013] warfarin  5 mg Oral Once per day on Sun Mon Wed Thu Sat  . warfarin  7.5 mg Oral Once  . warfarin  7.5 mg Oral Once per day on Tue Fri  . warfarin  1 each Does not apply Once  . Warfarin - Pharmacist Dosing Inpatient   Does not apply q1800   Continuous Infusions: . milrinone 0.25 mcg/kg/min (09/04/13 0158)  PRN Meds:.albuterol, alum & mag hydroxide-simeth, promethazine Assessment/Plan: Principal Problem:   Chronic cough Active Problems:   Atrial fibrillation   Chronic systolic heart failure   Sleep apnea   Pneumonia   Acute respiratory failure   Acute pulmonary edema   LOS: 2 days   Mr. Dack is a 73yo man with h/o CHF w/ ICD on milrinone, Afib on warfarin, OSA, allergic rhinitis, and GERD who presents with worsening cough with elevated WBC and acute on chronic CHF.   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 TEE on 3/414 with EF 10-15% with diffuse hypokinesis and moderate mitral valve regurgitation. Pt with pro-BNP elevated at 5151 (1 month ago was 22K) and significant pulmonary edema on chest xray (2/20). Pt with weight on admission of 185 above reported baseline dry wt 175-180lb. Current weight is 190lb. Cr 1.65 (baseline of 0.9 - 1.2) Patient has crackles on lung exam with no pedal edema, indicating LHF.  -HF team recs appreciated (HF doc is Dr.  Lucille Passy) -Increase furosemide from 59m PO BID to 864mIV BID -Stop Coreg due to acute CHF that could decompensate -Digoxin 0.125 mg qD  -Continuous milrinone per pharmacy -Sildenafil 2026mID -Hold ARB while patient has AKI due to Cr from 1.3 --> 1.9  -Aspirin 7m23m  -Daily weights   # Acute on chronic cough with post-tussive emesis most likely secondary to CAP: Patient receives milrinone IV from home health, but has been having worse cough for the past week without fever. His previous hospitalization was in March 2014, but he has been to the ED in Jan 2015 for cough symptoms. With fever of 101 despite being on antibiotics for CAP and WBC elevated to 19.2, HCAP and HAP are suspected. Elevated procalcitonin (~8) suggests infection. Patient has negative HIV antibody, negative legionella Ag and negative Ur strep pneumo Ag.   Antibiotics:  Azithro 2/19 >>> 2/20 Ceftriaxone 2/19 >>> 2/20 Cefepime 2/20 >>> Vanc 2/20 >>>  Plan:  -Broaden antibiotics to vanc/cefepime cover MRSA and Pseudomonas; anaerobe coverage not considered at this time due to no clinical indications for aspiration; can consider adding anaerobic coverage if indicated.  -PCCM recs appreciated as they think that patient may need ICU level care for any decompensation if patient is full code -Need to confirm code status with wife  -Albuterol Nebs q6H  -Mucinex 600mg44m and tessalon 200 TID for cough -Loratadine 10mg 17mallergies  -Protonix 40mg q24mPromethazine 6.25mg in71mions q6H PRN   -Outpaitent PFT (09/14/13) to assess for obstructive/restrictive disease  -CBC in AM  # Paroxysmal Atrial Fibrillation:  Pt currently in sinus rhythm with normal HR. Pt at home on amiodarone 200 mg daily for rhythm control [facility] Pt is on therapeutic dose today, INR (2.24).  -Warfarin per pharmacy  -Continue home amiodarone 200 mg daily  -Continue to monitor INR with goal 2-3.   # AKI on CKD stage 2 - Current  Cr 1.65. Pt with Cr on admission of 1.37, above baseline of 1-1.3.  -Treat CHF as outlined above -Avoid nephrotoxins   # Normocytic Anemia - Hgb stable at 11.1 from 12.2. Likely hemodilution from acute CHF. Pt with history of since 1 month ago. Etiology most likely due to CKD.  -Consider anemia panel  -Monitor for bleeding  -Monitor CBC   # Obstructive Sleep Apnea - Pt reports compliance with CPAP at home.  -Currently  require BiPAP  # Allergic Rhinitis - Pt reports symptoms controlled on home anti-histamine therapy (home cetirizine 10 mg daily).  -Loratadine 10 mg daily (forumalary)  -Consider nasal corticosteroid, 1st line for AR   # GERD - currently without reflux symptoms. Pt on PPI (protonix) therapy at home.  -Protonix 40 mg daily   # Constipation - Currently without symptoms  -Continue home senna 8.6 mg daily  -Magnesium oxide daily  -Maalox 10m q6H PRN  # Diet: Heart healthy  # DVT Ppx: Coumadin per pharmacy, SCD's  # Code: DNR only, POA is wife  This is a MCareers information officerNote.  The care of the patient was discussed with Dr. CLinus Salmonsand the assessment and plan formulated with their assistance.  Please see their attached note for official documentation of the daily encounter.  JKonrad Saha2/20/2015, 1:23 PM   I have seen the patient and reviewed the daily progress note by RElvin SoMS IV and discussed the care of the patient with them.  I agree with the plans as documented.   Signed:  RJessee Avers MD PGY-2 Internal Medicine Teaching Service Pager: [phone]

## 2013-09-05 ENCOUNTER — Inpatient Hospital Stay (HOSPITAL_COMMUNITY): Payer: Medicare Other

## 2013-09-05 DIAGNOSIS — J189 Pneumonia, unspecified organism: Secondary | ICD-10-CM

## 2013-09-05 DIAGNOSIS — I4891 Unspecified atrial fibrillation: Secondary | ICD-10-CM

## 2013-09-05 DIAGNOSIS — I5023 Acute on chronic systolic (congestive) heart failure: Principal | ICD-10-CM

## 2013-09-05 DIAGNOSIS — R0989 Other specified symptoms and signs involving the circulatory and respiratory systems: Secondary | ICD-10-CM

## 2013-09-05 DIAGNOSIS — J810 Acute pulmonary edema: Secondary | ICD-10-CM

## 2013-09-05 DIAGNOSIS — J9600 Acute respiratory failure, unspecified whether with hypoxia or hypercapnia: Secondary | ICD-10-CM

## 2013-09-05 DIAGNOSIS — R0609 Other forms of dyspnea: Secondary | ICD-10-CM

## 2013-09-05 LAB — GLUCOSE, CAPILLARY
GLUCOSE-CAPILLARY: 91 mg/dL (ref 70–99)
GLUCOSE-CAPILLARY: 96 mg/dL (ref 70–99)
Glucose-Capillary: 93 mg/dL (ref 70–99)
Glucose-Capillary: 94 mg/dL (ref 70–99)
Glucose-Capillary: 96 mg/dL (ref 70–99)

## 2013-09-05 LAB — CARBOXYHEMOGLOBIN
Carboxyhemoglobin: 1.8 % — ABNORMAL HIGH (ref 0.5–1.5)
Methemoglobin: 0.8 % (ref 0.0–1.5)
O2 Saturation: 65 %
Total hemoglobin: 12.5 g/dL — ABNORMAL LOW (ref 13.5–18.0)

## 2013-09-05 LAB — BASIC METABOLIC PANEL
BUN: 44 mg/dL — AB (ref 6–23)
CALCIUM: 10.3 mg/dL (ref 8.4–10.5)
CO2: 28 mEq/L (ref 19–32)
CREATININE: 1.64 mg/dL — AB (ref 0.50–1.35)
Chloride: 98 mEq/L (ref 96–112)
GFR calc Af Amer: 47 mL/min — ABNORMAL LOW (ref >90)
GFR calc non Af Amer: 40 mL/min — ABNORMAL LOW (ref >90)
GLUCOSE: 99 mg/dL (ref 70–99)
Potassium: 3.1 mEq/L — ABNORMAL LOW (ref 3.7–5.3)
Sodium: 140 mEq/L (ref 137–147)

## 2013-09-05 LAB — PROTIME-INR
INR: 3.27 — ABNORMAL HIGH (ref 0.00–1.49)
Prothrombin Time: 32.1 seconds — ABNORMAL HIGH (ref 11.6–15.2)

## 2013-09-05 LAB — CBC
HCT: 34.4 % — ABNORMAL LOW (ref 39.0–52.0)
Hemoglobin: 11.9 g/dL — ABNORMAL LOW (ref 13.0–17.0)
MCH: 31.2 pg (ref 26.0–34.0)
MCHC: 34.6 g/dL (ref 30.0–36.0)
MCV: 90.3 fL (ref 78.0–100.0)
PLATELETS: 187 10*3/uL (ref 150–400)
RBC: 3.81 MIL/uL — ABNORMAL LOW (ref 4.22–5.81)
RDW: 15.2 % (ref 11.5–15.5)
WBC: 18.2 10*3/uL — ABNORMAL HIGH (ref 4.0–10.5)

## 2013-09-05 LAB — MAGNESIUM: Magnesium: 2.3 mg/dL (ref 1.5–2.5)

## 2013-09-05 LAB — PHOSPHORUS: PHOSPHORUS: 3.1 mg/dL (ref 2.3–4.6)

## 2013-09-05 MED ORDER — POTASSIUM CHLORIDE 10 MEQ/50ML IV SOLN
10.0000 meq | INTRAVENOUS | Status: AC
  Administered 2013-09-05 (×4): 10 meq via INTRAVENOUS
  Filled 2013-09-05 (×2): qty 50

## 2013-09-05 MED ORDER — CHLORHEXIDINE GLUCONATE 0.12 % MT SOLN
15.0000 mL | Freq: Two times a day (BID) | OROMUCOSAL | Status: DC
  Administered 2013-09-05 – 2013-09-14 (×18): 15 mL via OROMUCOSAL
  Filled 2013-09-05 (×20): qty 15

## 2013-09-05 MED ORDER — BIOTENE DRY MOUTH MT LIQD
15.0000 mL | Freq: Two times a day (BID) | OROMUCOSAL | Status: DC
  Administered 2013-09-06 – 2013-09-13 (×14): 15 mL via OROMUCOSAL

## 2013-09-05 NOTE — Progress Notes (Signed)
Subjective: Feels better today. Wife at bedside. Patient is currently DNR  Objective: Vital signs in last 24 hours: Filed Vitals:   09/05/13 1156 09/05/13 1200 09/05/13 1245 09/05/13 1300  BP:  86/39  98/37  Pulse:  77 77 78  Temp:      TempSrc:      Resp:  22 33 30  Height:      Weight:      SpO2: 88% 89% 88% 88%   Weight change: -16 lb 8.6 oz (-7.5 kg)  Intake/Output Summary (Last 24 hours) at 09/05/13 1342 Last data filed at 09/05/13 1300  Gross per 24 hour  Intake  799.4 ml  Output   3200 ml  Net -2400.6 ml   General: appears less dyspneic, on BiPAP Cardiac: RRR, no rubs, murmurs or gallops Pulm: decreased air mvmt bilaterally  Abd: soft, nontender, nondistended, BS normoactive Ext: warm and well perfused, no pedal edema Neuro: alert and oriented X3, cranial nerves II-XII grossly intact  Lab Results: Basic Metabolic Panel:  Recent Labs Lab 09/04/13 0501 09/05/13 0301  NA 139 140  K 3.8 3.1*  CL 101 98  CO2 24 28  GLUCOSE 133* 99  BUN 37* 44*  CREATININE 1.65* 1.64*  CALCIUM 10.0 10.3   Liver Function Tests:  Recent Labs Lab 09/03/13 0458  AST 68*  ALT 33  ALKPHOS 57  BILITOT 2.2*  PROT 6.4  ALBUMIN 3.1*   CBC:  Recent Labs Lab 09/02/13 1409  09/04/13 0501 09/05/13 0301  WBC 14.2*  < > 19.2* 18.2*  NEUTROABS 10.4*  --   --   --   HGB 12.2*  < > 11.1* 11.9*  HCT 36.1*  < > 32.5* 34.4*  MCV 92.6  < > 91.3 90.3  PLT 165  < > 180 187  < > = values in this interval not displayed. BNP:  Recent Labs Lab 09/02/13 1409  PROBNP 5151.0*   Coagulation:  Recent Labs Lab 09/02/13 1750 09/03/13 0458 09/04/13 0501 09/05/13 0301  LABPROT 19.0* 20.4* 24.1* 32.1*  INR 1.64* 1.80* 2.24* 3.27*   Studies/Results: Dg Chest Port 1 View  09/04/2013   CLINICAL DATA:  Evaluate pulmonary edema ; leukocytosis, fever  EXAM: PORTABLE CHEST - 1 VIEW  COMPARISON:  Portable exam 0907 hr compared to 09/02/2013  FINDINGS: Right jugular central venous  catheter with tip projecting over SVC.  Left subclavian AICD leads project over right atrium and right ventricle.  Enlargement of cardiac silhouette with pulmonary vascular congestion.  Diffuse bilateral airspace infiltrates significantly increased since previous exam could represent pulmonary edema or pneumonia.  No gross pleural effusion or pneumothorax.  IMPRESSION: Enlargement of cardiac silhouette with pulmonary vascular congestion post AICD.  Significantly increased bilateral airspace infiltrates which could represent pulmonary edema or pneumonia.   Electronically Signed   By: Ulyses SouthwardMark  Boles M.D.   On: 09/04/2013 09:21   Medications: I have reviewed the patient's current medications. Scheduled Meds: . amiodarone  200 mg Oral Daily  . aspirin EC  81 mg Oral Daily  . benzonatate  200 mg Oral TID  . ceFEPime (MAXIPIME) IV  1 g Intravenous 3 times per day  . digoxin  0.0625 mg Oral Daily  . ferrous fumarate-b12-vitamic C-folic acid  1 capsule Oral Q lunch  . furosemide  80 mg Intravenous BID  . guaiFENesin  600 mg Oral BID  . loratadine  10 mg Oral Daily  . magnesium oxide  1,000 mg Oral BID  . pantoprazole  40 mg Oral Daily  . pyridOXINE  100 mg Oral Q lunch  . sildenafil  20 mg Oral TID  . sodium chloride  3 mL Intravenous Q12H  . vancomycin  750 mg Intravenous Q12H  . warfarin  1 each Does not apply Once  . Warfarin - Pharmacist Dosing Inpatient   Does not apply q1800   Continuous Infusions: . milrinone 0.25 mcg/kg/min (09/05/13 1240)   PRN Meds:.albuterol, alum & mag hydroxide-simeth, promethazine  Assessment/Plan: [name] is a 73 yo M with sCHF, GERD, CKD, and OSA on CPAP who was admitted on 09/02/13 with worsening cough.  # Presumed CAP: Improved. Previously febrile, but now afebrile. Likely Main component of his dyspnea is resulting from pulmonary edema. s/p vanc/zosyn x 1 in ED; to note, pt scehduled for outpt PFTs 09/14/13 Plan -continue with empiric antibiotics with cefepime  and Vanco. -Tessalon tid, Gauifenesin bid -albuterol prn - cont with BiPAP  # Acute on chronic Systolic CHF:  Status post AICD placement. ECHO 09/2012: EF 10-15% RV moderately HK moderate MR. Patient follows with Dr. Gala Romney as outpatient. Has been Milrinone since 2013. Plan  - per heart failure team - hold lasix  - cont with Milrinone  #Afib: rate controlled, anticoagulated -cont coumadin per pharm -cont coreg & amiodarone   #Acute on Chronic Renal Failure (CKD II/III): Unchanged. Baseline Cr ~1, but up up to 1.37 at admission the 1.9 >>1.64. Likely due to prerenal  Plan  - BMTs in am  #Normocytic Anemia: Hb 12.2 --> 10.7, no obvious s/s of bleeding, may be dilutional -monitor am CBC   #VTE ppx: coumadin per pharmacy  #Code: full code   Dispo: Disposition is deferred at this time, awaiting improvement of current medical problems.  Anticipated discharge in approximately 2-3 day(s).   The patient does have a current PCP Exie Parody, MD) and does not need an [facility] hospital follow-up appointment after discharge.  The patient does not have transportation limitations that hinder transportation to clinic appointments.  .Services Needed at time of discharge: Y = Yes, Blank = No PT:   OT:   RN:   Equipment:   Other:     LOS: 3 days   Dow Adolph, MD 09/05/2013, 1:42 PM

## 2013-09-05 NOTE — Progress Notes (Addendum)
ANTICOAGULATION CONSULT NOTE - Follow Up Consult  Pharmacy Consult for warfarin Indication: atrial fibrillation  No Known Allergies  Patient Measurements: Height: 5\' 10"  (177.8 cm) Weight: 174 lb 6.1 oz (79.1 kg) IBW/kg (Calculated) : 73   Vital Signs: Temp: 98.6 F (37 C) (02/21 0739) Temp src: Axillary (02/21 0739) BP: 94/45 mmHg (02/21 0806) Pulse Rate: 76 (02/21 0806)  Labs:  Recent Labs  09/03/13 0458 09/04/13 0501 09/05/13 0301  HGB 10.7* 11.1* 11.9*  HCT 31.9* 32.5* 34.4*  PLT 151 180 187  LABPROT 20.4* 24.1* 32.1*  INR 1.80* 2.24* 3.27*  CREATININE 1.90* 1.65* 1.64*    Estimated Creatinine Clearance: 42.7 ml/min (by C-G formula based on Cr of 1.64).   Medications:  Scheduled:  . amiodarone  200 mg Oral Daily  . aspirin EC  81 mg Oral Daily  . benzonatate  200 mg Oral TID  . ceFEPime (MAXIPIME) IV  1 g Intravenous 3 times per day  . digoxin  0.0625 mg Oral Daily  . ferrous fumarate-b12-vitamic C-folic acid  1 capsule Oral Q lunch  . furosemide  80 mg Intravenous BID  . guaiFENesin  600 mg Oral BID  . loratadine  10 mg Oral Daily  . magnesium oxide  1,000 mg Oral BID  . pantoprazole  40 mg Oral Daily  . pyridOXINE  100 mg Oral Q lunch  . sildenafil  20 mg Oral TID  . sodium chloride  3 mL Intravenous Q12H  . vancomycin  750 mg Intravenous Q12H  . warfarin  7.5 mg Oral Once  . warfarin  1 each Does not apply Once  . Warfarin - Pharmacist Dosing Inpatient   Does not apply q1800    Assessment: 72 YOM on warfarin PTA for AFib. His home dose was 5mg  daily except 7.5mg  on Tuesdays and Fridays. That home dosing was resumed here with daily INRs. INR this morning elevated at 3.27. Hgb and plts stable, no bleeding noted.  Goal of Therapy:  INR 2-3 Monitor platelets by anticoagulation protocol: Yes   Plan:  1. Hold tonight's warfarin dose 2. Follow up tomorrow morning's INR to see if home regimen can be resumed 3. Continue daily INRs 4. Follow for s/s  bleeding  Arbie Blankley D. Natasha Burda, PharmD, BCPS Clinical Pharmacist Pager: [phone] 09/05/2013 11:43 AM

## 2013-09-05 NOTE — Progress Notes (Signed)
[place] ICU Electrolyte Replacement Protocol  Patient Name: Gregg [name] DOB: 1941/06/10 MRN: [id number]  Date of Service  09/05/2013   HPI/Events of Note    Recent Labs Lab 09/02/13 1409 09/03/13 0458 09/04/13 0501 09/05/13 0301  NA 140 137 139 140  K 3.9 3.7 3.8 3.1*  CL 100 98 101 98  CO2 _0 GLUCOSE 109* 112* 133* 99  BUN 18 26* 37* 44*  CREATININE 1.37* 1.90* 1.65* 1.64*  CALCIUM 10.6* 9.9 10.0 10.3    Estimated Creatinine Clearance: 42.7 ml/min (by C-G formula based on Cr of 1.64).  Intake/Output     02/20 0701 - 02/21 0700   I.V. (mL/kg) 146.3 (1.8)   IV Piggyback 450   Total Intake(mL/kg) 596.3 (7.5)   Urine (mL/kg/hr) 3200 (1.7)   Total Output 3200   Net -2603.7       Stool Occurrence 3 x    - I/O DETAILED x24h    Total I/O In: 520.1 [I.V.:70.1; IV Piggyback:450] Out: 2300 [Urine:2300] - I/O THIS SHIFT    ASSESSMENT   eICURN Interventions  Electrolyte protocol criteria met. Value replaced per protocol. MD notified.   ASSESSMENT: MAJOR ELECTROLYTE    Lorene Dy 09/05/2013, 6:39 AM

## 2013-09-05 NOTE — Progress Notes (Signed)
RT returned pt to Bipap with his previous settings. No complications. Vital signs stable at this time. RT will continue to monitor.

## 2013-09-05 NOTE — Progress Notes (Signed)
RT removed pt from Bipap and placed on 100% NRB. Maintaining 02 saturations between 88-90%. Will return pt to Bipap in 30 minutes per MD order. No complications. Vital signs stable at this time. RT will continue to monitor.

## 2013-09-05 NOTE — Progress Notes (Signed)
Placed 6 liter nasal cannula with 100% NRB per MD. RT will continue to monitor.

## 2013-09-05 NOTE — Progress Notes (Addendum)
Name: Gregg [name] MRN: [id number] DOB: 09/14/1940    ADMISSION DATE:  09/02/2013 CONSULTATION DATE:  09/04/2013  REFERRING MD :  Luciana Axeomer PRIMARY SERVICE: IM  CHIEF COMPLAINT:  Dyspnea/cough  BRIEF PATIENT DESCRIPTION: 73 year old male with CHF (LVEF 10-15%) admitted 2/18 for dyspnea, CAP vs CHF exacerbation. Hypoxic morning of 2/20 PCCM asked to see  SIGNIFICANT EVENTS / STUDIES:  09/16/2012 - Echo 2 > LVEF 10-15%.  2/18 Admitted 2/21 remains on BiPAP  LINES / TUBES: Tunneled subclavian CVL - from home 10/07/2012 >>>  CULTURES: 2/19 C-diff > neg  2/18 Blood >>>  Resp 2/18 >neg  ANTIBIOTICS: azithro 2/18 >>>  Rocephin 2/18 >>>  SUBJECTIVE: patient states feeling better this morning.  VITAL SIGNS: Temp:  [98 F (36.7 C)-99.1 F (37.3 C)] 98.6 F (37 C) (02/21 0739) Pulse Rate:  [75-87] 76 (02/21 0806) Resp:  [22-40] 32 (02/21 0806) BP: (85-108)/(40-64) 94/45 mmHg (02/21 0806) SpO2:  [91 %-100 %] 95 % (02/21 0806) FiO2 (%):  [100 %] 100 % (02/21 0807) Weight:  [79.1 kg (174 lb 6.1 oz)] 79.1 kg (174 lb 6.1 oz) (02/21 0500) HEMODYNAMICS: CVP:  [6 mmHg-20 mmHg] 6 mmHg VENTILATOR SETTINGS: Vent Mode:  [-] BIPAP;PCV FiO2 (%):  [100 %] 100 % Set Rate:  [10 bmp] 10 bmp PEEP:  [8 cmH20] 8 cmH20 INTAKE / OUTPUT: Intake/Output     02/20 0701 - 02/21 0700 02/21 0701 - 02/22 0700   P.O.     I.V. (mL/kg) 146.3 (1.8)    IV Piggyback 450    Total Intake(mL/kg) 596.3 (7.5)    Urine (mL/kg/hr) 3200 (1.7)    Total Output 3200     Net -2603.7          Stool Occurrence 3 x      PHYSICAL EXAMINATION: General: NAD comfortable on BiPAP  Neuro: Alert, answers questions appropriately  HEENT: [place]/AT, BiPAP in place Cardiovascular: RRR  Lungs: Crackles throughout  Abdomen: soft, non-tender, non-distended  Musculoskeletal: Intact, moves all extremities.  Skin: Intact, no LE edema  LABS:  CBC  Recent Labs Lab 09/03/13 0458 09/04/13 0501 09/05/13 0301  WBC 20.9* 19.2*  18.2*  HGB 10.7* 11.1* 11.9*  HCT 31.9* 32.5* 34.4*  PLT 151 180 187   Coag's  Recent Labs Lab 09/03/13 0458 09/04/13 0501 09/05/13 0301  INR 1.80* 2.24* 3.27*   BMET  Recent Labs Lab 09/03/13 0458 09/04/13 0501 09/05/13 0301  NA 137 139 140  K 3.7 3.8 3.1*  CL 98 101 98  CO2 23 24 28   BUN 26* 37* 44*  CREATININE 1.90* 1.65* 1.64*  GLUCOSE 112* 133* 99   Electrolytes  Recent Labs Lab 09/03/13 0458 09/04/13 0501 09/05/13 0301  CALCIUM 9.9 10.0 10.3   Sepsis Markers  Recent Labs Lab 09/04/13 0821  PROCALCITON 8.12   ABG  Recent Labs Lab 09/04/13 1930  PHART 7.467*  PCO2ART 38.7  PO2ART 57.8*   Liver Enzymes  Recent Labs Lab 09/03/13 0458  AST 68*  ALT 33  ALKPHOS 57  BILITOT 2.2*  ALBUMIN 3.1*   Cardiac Enzymes  Recent Labs Lab 09/02/13 1409  PROBNP 5151.0*   Glucose  Recent Labs Lab 09/04/13 2014 09/05/13 0006 09/05/13 0434 09/05/13 0708  GLUCAP 91 91 96 93    Imaging Dg Chest Port 1 View  09/04/2013   CLINICAL DATA:  Evaluate pulmonary edema ; leukocytosis, fever  EXAM: PORTABLE CHEST - 1 VIEW  COMPARISON:  Portable exam 0907 hr compared to 09/02/2013  FINDINGS: Right jugular central venous catheter with tip projecting over SVC.  Left subclavian AICD leads project over right atrium and right ventricle.  Enlargement of cardiac silhouette with pulmonary vascular congestion.  Diffuse bilateral airspace infiltrates significantly increased since previous exam could represent pulmonary edema or pneumonia.  No gross pleural effusion or pneumothorax.  IMPRESSION: Enlargement of cardiac silhouette with pulmonary vascular congestion post AICD.  Significantly increased bilateral airspace infiltrates which could represent pulmonary edema or pneumonia.   Electronically Signed   By: Ulyses Southward M.D.   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%, acute exacerbation A fib s/p dc-cv  On coumadin ?PA HTN P:  Cards following - appreciate their help Coumadin per pharmacy - hold today given elevation of INR Continue lasix -2.6 L yesterday Monitor CVP Continue milrinone Continue digoxin, amiodarone, and sildenafil Metolazone Aspirin   RENAL A:  AKI on CKD 2 - improving Hypokalemia P:   Lasix Trend Cr K repleted by elink Mag, phos in am   GASTROINTESTINAL A:  constipation P:   On home senna Mag oxide daily maalox NPO with such hypoxcia  HEMATOLOGIC A:  Leukocytosis - potentially related to PNA Anemia - improving P:  Monitor CBC  INFECTIOUS A:  ?CAP, with elevated pct, afebrile argues against this P:   Continue above antibiotics procalcitonin algorithm - goal to d/c antibiotics rapidly if trending down  ENDOCRINE A:  No history DM   P:   Monitor CBGs, if >150 start SSI  NEUROLOGIC A:  No acute issues P:   Monitor mental status  Marikay Alar, MD [facility] PGY-2  TODAY'S SUMMARY: continue diuresis, CXR today, wean off BiPAP, mandatory off 30 min , check pct to liit abx exposure, lasix  I have personally obtained a history, examined the patient, evaluated laboratory and imaging results, formulated the assessment and plan and placed orders. CRITICAL CARE: The patient is critically ill with multiple organ systems failure and requires high complexity decision making for assessment and support, frequent evaluation and titration of therapies, application of  advanced monitoring technologies and extensive interpretation of multiple databases. Critical Care Time devoted to patient care services described in this note is 30 minutes.   Mcarthur Rossetti. Tyson Alias, MD, FACP Pgr: [phone] [facility]   Pulmonary and Critical Care Medicine [facility] Pager: [phone]  09/05/2013, 10:16 AM

## 2013-09-05 NOTE — Progress Notes (Signed)
  Date: 09/05/2013  Patient name: Gregg [name]  Medical record number: [id number]  Date of birth: 04/10/1941   This patient has been seen and the plan of care was discussed with the house staff. Please see their note for complete details. I concur with their findings with the following additions/corrections:  Some improvement.  Appreciate input from heart failure team and CCM.  Possible infection and on broad spectrum antibiotics to assure he improves.  Will start tapering as appropriate.    Gardiner Barefoot, MD 09/05/2013, 11:26 AM

## 2013-09-05 NOTE — Consult Note (Signed)
Advanced Heart Failure Team Consult Note  Referring Physician: CCM Primary Physician: Dr. Allene Dillonorres (manages INR) Primary Cardiologist:  Dr. Gala RomneyBensimhon  Reason for Consultation: HF  HPI:    Gregg [name] is a 73 y.o. 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 On 04/02/12 underwent neurocognitive testing.and is not a candidate for advanced therapies due to results for neurocognitive results. Maintained on home milrinone.   ECHO 09/2012: EF 10-15% RV moderately HK moderate MR  Admitted with respiratory failure thought primarily due to PNA with small component of HF. Moved to MICU due to respiratory distress. Remains on BIPAP. Improving. Diuresed well. CVP down to 9. Renal function stable. Feeling better.    Objective:    Vital Signs:   Temp:  [98 F (36.7 C)-99.1 F (37.3 C)] 98.6 F (37 C) (02/21 0739) Pulse Rate:  [75-87] 76 (02/21 0806) Resp:  [22-40] 32 (02/21 0806) BP: (85-108)/(40-64) 94/45 mmHg (02/21 0806) SpO2:  [91 %-100 %] 95 % (02/21 0806) FiO2 (%):  [100 %] 100 % (02/21 0807) Weight:  [79.1 kg (174 lb 6.1 oz)] 79.1 kg (174 lb 6.1 oz) (02/21 0500) Last BM Date: 09/04/13  Weight change: Filed Weights   09/03/13 2223 09/04/13 0355 09/05/13 0500  Weight: 86.6 kg (190 lb 14.7 oz) 86.6 kg (190 lb 14.7 oz) 79.1 kg (174 lb 6.1 oz)    Intake/Output:   Intake/Output Summary (Last 24 hours) at 09/05/13 1023 Last data filed at 09/05/13 0600  Gross per 24 hour  Intake    575 ml  Output   3200 ml  Net  -2625 ml     Physical Exam: General: Comfortable. On bipap HEENT: normal Neck: supple. JVP 9 . Carotids 2+ bilat; no bruits. No lymphadenopathy or thryomegaly appreciated. Cor: PMI laterally displaced. No S3. Regular  rate & rhythm. R upper chest hickman. No rubs. 2/6 MR Lungs: crackles throughout Abdomen: soft, nontender, nondistended. No hepatosplenomegaly. No bruits or masses. Good bowel sounds. Extremities: no cyanosis, clubbing, rash, edema Neuro: alert & orientedx3, cranial nerves grossly intact. moves all 4 extremities w/o difficulty. Affect pleasant  Telemetry: SR 80s  Labs: Basic Metabolic Panel:  Recent Labs Lab 09/02/13 1409 09/03/13 0458 09/04/13 0501 09/05/13 0301  NA 140 137 139 140  K 3.9 3.7 3.8 3.1*  CL 100 98 101 98  CO2 26 23 24 28   GLUCOSE 109* 112* 133* 99  BUN 18 26* 37* 44*  CREATININE 1.37* 1.90* 1.65* 1.64*  CALCIUM 10.6* 9.9 10.0 10.3    Liver Function Tests:  Recent Labs Lab 09/03/13 0458  AST 68*  ALT 33  ALKPHOS 57  BILITOT 2.2*  PROT 6.4  ALBUMIN 3.1*   No results found for this basename: LIPASE, AMYLASE,  in the last 168 hours No results found for this basename: AMMONIA,  in the last 168 hours  CBC:  Recent Labs Lab 09/02/13 1409 09/03/13 0458 09/04/13 0501 09/05/13 0301  WBC 14.2* 20.9* 19.2* 18.2*  NEUTROABS 10.4*  --   --   --   HGB 12.2* 10.7* 11.1* 11.9*  HCT 36.1* 31.9* 32.5* 34.4*  MCV 92.6 92.7 91.3 90.3  PLT 165 151 180 187    Cardiac Enzymes: No results found for this basename: CKTOTAL, CKMB, CKMBINDEX, TROPONINI,  in the  last 168 hours  BNP: BNP (last 3 results)  Recent Labs  07/21/13 1159 09/02/13 1409  PROBNP 1898.0* 5151.0*    CBG:  Recent Labs Lab 09/04/13 2014 09/05/13 0006 09/05/13 0434 09/05/13 0708  GLUCAP 91 91 96 93    Coagulation Studies:  Recent Labs  09/02/13 1750 09/03/13 0458 09/04/13 0501 09/05/13 0301  LABPROT 19.0* 20.4* 24.1* 32.1*  INR 1.64* 1.80* 2.24* 3.27*     Imaging: Dg Chest Port 1 View  09/04/2013   CLINICAL DATA:  Evaluate pulmonary edema ; leukocytosis, fever  EXAM: PORTABLE CHEST - 1 VIEW  COMPARISON:  Portable exam 0907 hr compared to 09/02/2013  FINDINGS: Right  jugular central venous catheter with tip projecting over SVC.  Left subclavian AICD leads project over right atrium and right ventricle.  Enlargement of cardiac silhouette with pulmonary vascular congestion.  Diffuse bilateral airspace infiltrates significantly increased since previous exam could represent pulmonary edema or pneumonia.  No gross pleural effusion or pneumothorax.  IMPRESSION: Enlargement of cardiac silhouette with pulmonary vascular congestion post AICD.  Significantly increased bilateral airspace infiltrates which could represent pulmonary edema or pneumonia.   Electronically Signed   By: Ulyses Southward M.D.   On: 09/04/2013 09:21     Medications:     Current Medications: . amiodarone  200 mg Oral Daily  . aspirin EC  81 mg Oral Daily  . benzonatate  200 mg Oral TID  . ceFEPime (MAXIPIME) IV  1 g Intravenous 3 times per day  . digoxin  0.0625 mg Oral Daily  . ferrous fumarate-b12-vitamic C-folic acid  1 capsule Oral Q lunch  . furosemide  80 mg Intravenous BID  . guaiFENesin  600 mg Oral BID  . loratadine  10 mg Oral Daily  . magnesium oxide  1,000 mg Oral BID  . pantoprazole  40 mg Oral Daily  . potassium chloride  10 mEq Intravenous Q1 Hr x 4  . pyridOXINE  100 mg Oral Q lunch  . sildenafil  20 mg Oral TID  . sodium chloride  3 mL Intravenous Q12H  . vancomycin  750 mg Intravenous Q12H  . warfarin  7.5 mg Oral Once  . warfarin  1 each Does not apply Once  . Warfarin - Pharmacist Dosing Inpatient   Does not apply q1800    Infusions: . milrinone 0.25 mcg/kg/min (09/04/13 1905)     Assessment:   1) A/C systolic HF - EF 95-28% (09/2012) - on home milrinone 0.25 2) A/C respiratory failure 3) CAP ? 4) Afib s/p DC-CV 3/14 - on Coumadin 5) DNR  Plan/Discussion:    Improving with BIPAP, abx and diuresis. Co-ox ok. CVP now 9. Would hold lasix for CVP 8 or less. Continue milrinone.   Given that PNA is major issue and he has done well on home milrinone, I would not  be opposed to short-term course of intubation if needed but it looks like he may be improving without it.   In NSR. Continue coumadin.   Appreciate PCCM and primary care team's care.   Yandiel Bergum,MD 10:23 AM

## 2013-09-06 LAB — CARBOXYHEMOGLOBIN
CARBOXYHEMOGLOBIN: 1.6 % — AB (ref 0.5–1.5)
Carboxyhemoglobin: 1.9 % — ABNORMAL HIGH (ref 0.5–1.5)
METHEMOGLOBIN: 0.8 % (ref 0.0–1.5)
Methemoglobin: 0.8 % (ref 0.0–1.5)
O2 SAT: 63.2 %
O2 Saturation: 63.4 %
TOTAL HEMOGLOBIN: 7.7 g/dL — AB (ref 13.5–18.0)
Total hemoglobin: 11.1 g/dL — ABNORMAL LOW (ref 13.5–18.0)

## 2013-09-06 LAB — BASIC METABOLIC PANEL
BUN: 68 mg/dL — ABNORMAL HIGH (ref 6–23)
CHLORIDE: 96 meq/L (ref 96–112)
CO2: 31 mEq/L (ref 19–32)
CREATININE: 2.15 mg/dL — AB (ref 0.50–1.35)
Calcium: 10.6 mg/dL — ABNORMAL HIGH (ref 8.4–10.5)
GFR calc non Af Amer: 29 mL/min — ABNORMAL LOW (ref >90)
GFR, EST AFRICAN AMERICAN: 34 mL/min — AB (ref >90)
Glucose, Bld: 93 mg/dL (ref 70–99)
Potassium: 3 mEq/L — ABNORMAL LOW (ref 3.7–5.3)
SODIUM: 137 meq/L (ref 137–147)

## 2013-09-06 LAB — CBC
HEMATOCRIT: 33 % — AB (ref 39.0–52.0)
Hemoglobin: 11.1 g/dL — ABNORMAL LOW (ref 13.0–17.0)
MCH: 30.6 pg (ref 26.0–34.0)
MCHC: 33.6 g/dL (ref 30.0–36.0)
MCV: 90.9 fL (ref 78.0–100.0)
PLATELETS: 185 10*3/uL (ref 150–400)
RBC: 3.63 MIL/uL — ABNORMAL LOW (ref 4.22–5.81)
RDW: 15.1 % (ref 11.5–15.5)
WBC: 16.3 10*3/uL — ABNORMAL HIGH (ref 4.0–10.5)

## 2013-09-06 LAB — DIGOXIN LEVEL: Digoxin Level: 1.1 ng/mL (ref 0.8–2.0)

## 2013-09-06 LAB — PHOSPHORUS: PHOSPHORUS: 3.6 mg/dL (ref 2.3–4.6)

## 2013-09-06 LAB — PROTIME-INR
INR: 4.94 — AB (ref 0.00–1.49)
PROTHROMBIN TIME: 44 s — AB (ref 11.6–15.2)

## 2013-09-06 LAB — MAGNESIUM: Magnesium: 2.7 mg/dL — ABNORMAL HIGH (ref 1.5–2.5)

## 2013-09-06 LAB — PROCALCITONIN: Procalcitonin: 14.25 ng/mL

## 2013-09-06 MED ORDER — POTASSIUM CHLORIDE CRYS ER 20 MEQ PO TBCR
40.0000 meq | EXTENDED_RELEASE_TABLET | Freq: Four times a day (QID) | ORAL | Status: AC
  Administered 2013-09-06 (×2): 40 meq via ORAL
  Filled 2013-09-06 (×2): qty 2

## 2013-09-06 MED ORDER — DEXTROSE 5 % IV SOLN
1.0000 g | INTRAVENOUS | Status: DC
  Filled 2013-09-06: qty 1

## 2013-09-06 NOTE — Progress Notes (Signed)
Patient ID: Gaynelle CageFrank Russon, male   DOB: 10/18/1940, 73 y.o.   MRN: [id number]   SUBJECTIVE: Mr. [name] is a 73 y.o. 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 On 04/02/12 underwent neurocognitive testing.and is not a candidate for advanced therapies due to results for neurocognitive results. Maintained on home milrinone.   ECHO 09/2012: EF 10-15% RV moderately HK moderate MR   Admitted with respiratory failure thought primarily due to PNA with small component of HF. Moved to MICU due to respiratory distress. Remains on BIPAP.   Creatinine up to 2.15 from 1.6 yesterday.  CVP 4 this morning.  Diuretics held.  Afebrile, comfortable while on Bipap.  WBCs remain elevated.   Scheduled Meds: . amiodarone  200 mg Oral Daily  . antiseptic oral rinse  15 mL Mouth Rinse q12n4p  . aspirin EC  81 mg Oral Daily  . benzonatate  200 mg Oral TID  . ceFEPime (MAXIPIME) IV  1 g Intravenous 3 times per day  . chlorhexidine  15 mL Mouth Rinse BID  . digoxin  0.0625 mg Oral Daily  . ferrous fumarate-b12-vitamic C-folic acid  1 capsule Oral Q lunch  . guaiFENesin  600 mg Oral BID  . loratadine  10 mg Oral Daily  . magnesium oxide  1,000 mg Oral BID  . pantoprazole  40 mg Oral Daily  . potassium chloride  40 mEq Oral Q6H  . pyridOXINE  100 mg Oral Q lunch  . sildenafil  20 mg Oral TID  . sodium chloride  3 mL Intravenous Q12H  . vancomycin  750 mg Intravenous Q12H  . warfarin  1 each Does not apply Once  . Warfarin - Pharmacist Dosing Inpatient   Does not apply q1800   Continuous Infusions: . milrinone 0.25 mcg/kg/min (09/06/13 0621)   PRN Meds:.albuterol, alum & mag hydroxide-simeth, promethazine    Filed Vitals:   09/06/13 0600 09/06/13 0617  09/06/13 0700 09/06/13 0832  BP: 96/40  88/48 93/42  Pulse: 70 76 71 80  Temp:      TempSrc:      Resp: 25 23 26  33  Height:      Weight:      SpO2: 96% 96% 92% 92%    Intake/Output Summary (Last 24 hours) at 09/06/13 0838 Last data filed at 09/06/13 0700  Gross per 24 hour  Intake 1370.3 ml  Output   1250 ml  Net  120.3 ml    LABS: Basic Metabolic Panel:  Recent Labs  78/29/5602/21/15 0301 09/05/13 1255 09/06/13 0431  NA 140  --  137  K 3.1*  --  3.0*  CL 98  --  96  CO2 28  --  31  GLUCOSE 99  --  93  BUN 44*  --  68*  CREATININE 1.64*  --  2.15*  CALCIUM 10.3  --  10.6*  MG  --  2.3  --   PHOS  --  3.1  --    Liver Function Tests: No results found for this basename: AST, ALT, ALKPHOS, BILITOT, PROT, ALBUMIN,  in the last 72 hours No results found for this basename: LIPASE, AMYLASE,  in the last 72 hours CBC:  Recent Labs  09/05/13 0301 09/06/13 0431  WBC 18.2* 16.3*  HGB 11.9* 11.1*  HCT 34.4* 33.0*  MCV 90.3 90.9  PLT 187 185   Cardiac Enzymes: No results found for this basename: CKTOTAL, CKMB, CKMBINDEX, TROPONINI,  in the last 72 hours BNP: No components found with this basename: POCBNP,  D-Dimer: No results found for this basename: DDIMER,  in the last 72 hours Hemoglobin A1C: No results found for this basename: HGBA1C,  in the last 72 hours Fasting Lipid Panel: No results found for this basename: CHOL, HDL, LDLCALC, TRIG, CHOLHDL, LDLDIRECT,  in the last 72 hours Thyroid Function Tests: No results found for this basename: TSH, T4TOTAL, FREET3, T3FREE, THYROIDAB,  in the last 72 hours Anemia Panel: No results found for this basename: VITAMINB12, FOLATE, FERRITIN, TIBC, IRON, RETICCTPCT,  in the last 72 hours  RADIOLOGY: Dg Chest 2 View  09/02/2013   CLINICAL DATA:  Cough and emesis.  Joint pain  EXAM: CHEST  2 VIEW  COMPARISON:  07/21/2013  FINDINGS: Stable appearance of dual-chamber left approach ICD/pacer. Right IJ central venous catheter is in  stable position. Cardiomegaly which is also stable. New perihilar airspace disease, most dense on the right. No effusion or interstitial coarsening. No pneumothorax. No acute osseous findings.  IMPRESSION: Perihilar airspace disease is concerning for pneumonia given the focal appearance. Pulmonary edema can also have this appearance.   Electronically Signed   By: Tiburcio Pea M.D.   On: 09/02/2013 14:13   Dg Chest Port 1 View  09/05/2013   CLINICAL DATA:  Follow-up pulmonary edema.  EXAM: PORTABLE CHEST - 1 VIEW  COMPARISON:  DG CHEST 1V PORT dated 09/04/2013; DG CHEST 2 VIEW dated 09/02/2013; DG CHEST 2 VIEW dated 07/21/2013  FINDINGS: Interval improvement in the diffuse interstitial and airspace pulmonary edema since the examination yesterday, though moderate to severe airspace opacities persist throughout both lungs. No new pulmonary parenchymal abnormality. Right jugular tunneled central venous catheter tip projects over the lower SVC at or near the cavoatrial junction, unchanged. Left subclavian pacing defibrillator unchanged.  IMPRESSION: Improvement in the CHF since yesterday, though moderate to severe interstitial and airspace pulmonary edema persists. No new abnormalities.   Electronically Signed   By: Hulan Saas M.D.   On: 09/05/2013 16:54   Dg Chest Port 1 View  09/04/2013   CLINICAL DATA:  Evaluate pulmonary edema ; leukocytosis, fever  EXAM: PORTABLE CHEST - 1 VIEW  COMPARISON:  Portable exam 0907 hr compared to 09/02/2013  FINDINGS: Right jugular central venous catheter with tip projecting over SVC.  Left subclavian AICD leads project over right atrium and right ventricle.  Enlargement of cardiac silhouette with pulmonary vascular congestion.  Diffuse bilateral airspace infiltrates significantly increased since previous exam could represent pulmonary edema or pneumonia.  No gross pleural effusion or pneumothorax.  IMPRESSION: Enlargement of cardiac silhouette with pulmonary vascular  congestion post AICD.  Significantly increased bilateral airspace infiltrates which could represent pulmonary edema or pneumonia.   Electronically Signed   By: Ulyses Southward M.D.   On: 09/04/2013 09:21    PHYSICAL EXAM General: NAD Neck: No JVD, no thyromegaly or thyroid nodule.  Lungs: Crackles at bases bilaterally CV: Nondisplaced PMI.  Heart irregular S1/S2, no S3/S4, no murmur.  No peripheral edema.  No carotid bruit.  Normal pedal pulses.  Abdomen: Soft, nontender, no hepatosplenomegaly, no distention.  Neurologic: Alert and oriented x 3.  Psych: Normal affect. Extremities: No clubbing or cyanosis.   TELEMETRY: Reviewed telemetry pt in atrial flutter with HR 70s  Assessment:   1)  A/C systolic HF  - EF 10-15% (09/2012)  - on home milrinone 0.25  2) A/C respiratory failure  3) CAP ?  4) Afib s/p DC-CV 3/14 to NSR.  Appears to be in atrial flutter 2/22.  - on Coumadin  5) AKI 6) DNR   Plan/Discussion:   1) CHF: CVP 4 today with rising creatinine.  Diuretics held, continuing milrinone with co-ox 63% today. 2) CAP: Suspected.  WBCs 16,000; afebrile today, PCT high.  Continue antibiotics per primary team.  Still requiring Bipap. Given that PNA is major issue and he has done well on home milrinone, I would not be opposed to short-term course of intubation if needed. 3) h/o atrial fibrillation: Actually appears to be in rate-controlled atrial flutter today.  Will get ECG to confirm.  Suspect relapse triggered by acute inflammatory illness.  Continue amiodarone, consider cardioversion when he has clinically improved given current reasonable rate control. Continue coumadin.  4) AKI: Diuretics held, continue to follow creatinine.  Appreciate PCCM and primary care team's care.    Marca Ancona 09/06/2013 8:44 AM

## 2013-09-06 NOTE — Progress Notes (Signed)
ANTIBIOTIC CONSULT NOTE - FOLLOW UP  Pharmacy Consult for vancomycin and cefepime Indication: pneumonia  No Known Allergies  Patient Measurements: Height: _0  (177.8 cm) Weight: 171 lb 1.2 oz (77.6 kg) IBW/kg (Calculated) : 73  Vital Signs: Temp: 98.4 F (36.9 C) (02/22 0800) Temp src: Oral (02/22 0800) BP: 99/43 mmHg (02/22 1400) Pulse Rate: 73 (02/22 1500) Intake/Output from previous day: 02/21 0701 - 02/22 0700 In: 1376.4 [P.O.:580; I.V.:146.4; IV Piggyback:650] Out: 1250 [Urine:975; Stool:275] Intake/Output from this shift: Total I/O In: 124.4 [P.O.:100; I.V.:24.4] Out: 250 [Urine:250]  Labs:  Recent Labs  09/04/13 0501 09/05/13 0301 09/06/13 0431  WBC 19.2* 18.2* 16.3*  HGB 11.1* 11.9* 11.1*  PLT 180 187 185  CREATININE 1.65* 1.64* 2.15*   Estimated Creatinine Clearance: 32.5 ml/min (by C-G formula based on Cr of 2.15). No results found for this basename: VANCOTROUGH, Corlis Leak, VANCORANDOM, GENTTROUGH, GENTPEAK, GENTRANDOM, TOBRATROUGH, TOBRAPEAK, TOBRARND, AMIKACINPEAK, AMIKACINTROU, AMIKACIN,  in the last 72 hours   Microbiology: Recent Results (from the past 720 hour(s))  CULTURE, BLOOD (ROUTINE X 2)     Status: None   Collection Time    09/02/13  9:45 PM      Result Value Ref Range Status   Specimen Description BLOOD RIGHT ARM   Final   Special Requests BOTTLES DRAWN AEROBIC AND ANAEROBIC 10CC EACH   Final   Culture  Setup Time     Final   Value: 09/03/2013 00:33     Performed at [facility]   Culture     Final   Value:        BLOOD CULTURE RECEIVED NO GROWTH TO DATE CULTURE WILL BE HELD FOR 5 DAYS BEFORE ISSUING A FINAL NEGATIVE REPORT     Performed at [facility]   Report Status PENDING   Incomplete  CULTURE, BLOOD (ROUTINE X 2)     Status: None   Collection Time    09/02/13  9:55 PM      Result Value Ref Range Status   Specimen Description BLOOD RIGHT HAND   Final   Special Requests BOTTLES DRAWN AEROBIC ONLY 10CC   Final    Culture  Setup Time     Final   Value: 09/03/2013 00:33     Performed at [facility]   Culture     Final   Value:        BLOOD CULTURE RECEIVED NO GROWTH TO DATE CULTURE WILL BE HELD FOR 5 DAYS BEFORE ISSUING A FINAL NEGATIVE REPORT     Performed at [facility]   Report Status PENDING   Incomplete  RESPIRATORY VIRUS PANEL     Status: None   Collection Time    09/02/13  9:55 PM      Result Value Ref Range Status   Source - RVPAN NASAL SWAB   Corrected   Comment: CORRECTED ON 02/20 AT 0646: PREVIOUSLY REPORTED AS NASAL SWAB   Respiratory Syncytial Virus A NOT DETECTED   Final   Respiratory Syncytial Virus B NOT DETECTED   Final   Influenza A NOT DETECTED   Final   Influenza B NOT DETECTED   Final   Parainfluenza 1 NOT DETECTED   Final   Parainfluenza 2 NOT DETECTED   Final   Parainfluenza 3 NOT DETECTED   Final   Metapneumovirus NOT DETECTED   Final   Rhinovirus NOT DETECTED   Final   Adenovirus NOT DETECTED   Final   Influenza A H1 NOT  DETECTED   Final   Influenza A H3 NOT DETECTED   Final   Comment: (NOTE)           Normal Reference Range for each Analyte: NOT DETECTED     Testing performed using the Luminex xTAG Respiratory Viral Panel test     kit.     This test was developed and its performance characteristics determined     by [facility]. It has not been cleared or approved by the [place]     Food and Drug Administration. This test is used for clinical purposes.     It should not be regarded as investigational or for research. This     laboratory is certified under the [place] (CLIA) as qualified to perform high complexity     clinical laboratory testing.     Performed at [facility] DIFFICILE BY PCR     Status: None   Collection Time    09/03/13  5:47 PM      Result Value Ref Range Status   C difficile by pcr NEGATIVE  NEGATIVE Final  MRSA PCR SCREENING     Status: None    Collection Time    09/03/13 10:34 PM      Result Value Ref Range Status   MRSA by PCR NEGATIVE  NEGATIVE Final   Comment:            The GeneXpert MRSA Assay (FDA     approved for NASAL specimens     only), is one component of a     comprehensive MRSA colonization     surveillance program. It is not     intended to diagnose MRSA     infection nor to guide or     monitor treatment for     MRSA infections.    Anti-infectives   Start     Dose/Rate Route Frequency Ordered Stop   09/04/13 1200  ceFEPIme (MAXIPIME) 1 g in dextrose 5 % 50 mL IVPB     1 g 100 mL/hr over 30 Minutes Intravenous 3 times per day 09/04/13 1059     09/04/13 1100  vancomycin (VANCOCIN) IVPB 750 mg/150 ml premix     750 mg 150 mL/hr over 60 Minutes Intravenous Every 12 hours 09/04/13 1049     09/04/13 1100  piperacillin-tazobactam (ZOSYN) IVPB 3.375 g  Status:  Discontinued     3.375 g 12.5 mL/hr over 240 Minutes Intravenous 3 times per day 09/04/13 1049 09/04/13 1059   09/04/13 1000  azithromycin (ZITHROMAX) tablet 250 mg  Status:  Discontinued     250 mg Oral Daily 09/02/13 2250 09/04/13 0948   09/03/13 1000  azithromycin (ZITHROMAX) tablet 500 mg     500 mg Oral Daily 09/02/13 2250 09/03/13 0951   09/03/13 0800  vancomycin (VANCOCIN) IVPB 1000 mg/200 mL premix  Status:  Discontinued     1,000 mg 200 mL/hr over 60 Minutes Intravenous Every 12 hours 09/02/13 2135 09/02/13 2250   09/03/13 0000  piperacillin-tazobactam (ZOSYN) IVPB 3.375 g  Status:  Discontinued     3.375 g 12.5 mL/hr over 240 Minutes Intravenous Every 8 hours 09/02/13 2135 09/02/13 2250   09/03/13 0000  cefTRIAXone (ROCEPHIN) 1 g in dextrose 5 % 50 mL IVPB  Status:  Discontinued     1 g 100 mL/hr over 30 Minutes Intravenous Every 24 hours 09/02/13 2250 09/04/13 0948   09/02/13 1800  vancomycin (VANCOCIN) 1,500 mg in sodium chloride 0.9 % 500 mL IVPB     1,500 mg 250 mL/hr over 120 Minutes Intravenous  Once 09/02/13 1751 09/02/13 2040    09/02/13 1800  piperacillin-tazobactam (ZOSYN) IVPB 3.375 g  Status:  Discontinued     3.375 g 12.5 mL/hr over 240 Minutes Intravenous  Once 09/02/13 1751 09/02/13 1752   09/02/13 1800  piperacillin-tazobactam (ZOSYN) IVPB 3.375 g     3.375 g 100 mL/hr over 30 Minutes Intravenous  Once 09/02/13 1753 09/02/13 1831      Assessment: 72 YOM with CAP vs CHF exacerbation, started on BiPAP 2/21. Currently on vancomycin and cefepime for coverage- procalcitonin increased today. WBC 16.3, currently afebrile. SCr worsened to 2.15 this morning. Est CrCl ~51m/min.   Goal of Therapy:  Vancomycin trough level 15-20 mcg/ml  Plan:  1. Decrease cefepime dose to 1g IV q24h 2. Continue vancomycin 7535mIV q12h- checking trough tomorrow morning at 1030. 3. Continue to follow renal function, clinical progression, LOT  Mary Secord D. Shaily Librizzi, PharmD, BCPS Clinical Pharmacist Pager: [phone]/22/2015 3:08 PM

## 2013-09-06 NOTE — Progress Notes (Signed)
ANTICOAGULATION CONSULT NOTE - Follow Up Consult  Pharmacy Consult for warfarin Indication: atrial fibrillation  No Known Allergies  Patient Measurements: Height: 5\' 10"  (177.8 cm) Weight: 171 lb 1.2 oz (77.6 kg) IBW/kg (Calculated) : 73   Vital Signs: Temp: 98.4 F (36.9 C) (02/22 0800) Temp src: Oral (02/22 0800) BP: 96/43 mmHg (02/22 0900) Pulse Rate: 70 (02/22 1000)  Labs:  Recent Labs  09/04/13 0501 09/05/13 0301 09/06/13 0431  HGB 11.1* 11.9* 11.1*  HCT 32.5* 34.4* 33.0*  PLT 180 187 185  LABPROT 24.1* 32.1* 44.0*  INR 2.24* 3.27* 4.94*  CREATININE 1.65* 1.64* 2.15*    Estimated Creatinine Clearance: 32.5 ml/min (by C-G formula based on Cr of 2.15).   Medications:  Scheduled:  . amiodarone  200 mg Oral Daily  . antiseptic oral rinse  15 mL Mouth Rinse q12n4p  . aspirin EC  81 mg Oral Daily  . benzonatate  200 mg Oral TID  . ceFEPime (MAXIPIME) IV  1 g Intravenous 3 times per day  . chlorhexidine  15 mL Mouth Rinse BID  . digoxin  0.0625 mg Oral Daily  . ferrous fumarate-b12-vitamic C-folic acid  1 capsule Oral Q lunch  . guaiFENesin  600 mg Oral BID  . loratadine  10 mg Oral Daily  . magnesium oxide  1,000 mg Oral BID  . pantoprazole  40 mg Oral Daily  . potassium chloride  40 mEq Oral Q6H  . pyridOXINE  100 mg Oral Q lunch  . sildenafil  20 mg Oral TID  . sodium chloride  3 mL Intravenous Q12H  . vancomycin  750 mg Intravenous Q12H  . warfarin  1 each Does not apply Once  . Warfarin - Pharmacist Dosing Inpatient   Does not apply q1800    Assessment: 72 YOM on warfarin PTA for AFib. His home dose was 5mg  daily except 7.5mg  on Tuesdays and Fridays. That home dosing was resumed here with daily INRs. INR has increased to 4.94 this morning despite not receiving any warfarin yesterday d/t INR of 3.27. Hgb and plts have remained stable, no bleeding noted. Patient is on amiodarone, however this is a home medication which should not be acutely affecting  INR.  Goal of Therapy:  INR 2-3 Monitor platelets by anticoagulation protocol: Yes   Plan:  1. Hold tonight's warfarin dose 2. Continue daily INRs 3. Follow for s/s bleeding  Iver Miklas D. Batul Diego, PharmD, BCPS Clinical Pharmacist Pager: [phone] 09/06/2013 10:12 AM

## 2013-09-06 NOTE — Progress Notes (Signed)
Name: Gregg [name] MRN: [id number] DOB: 1940-10-08    ADMISSION DATE:  09/02/2013 CONSULTATION DATE:  09/04/2013  REFERRING MD :  Luciana Axe PRIMARY SERVICE: IM  CHIEF COMPLAINT:  Dyspnea/cough  BRIEF PATIENT DESCRIPTION: 73 year old male with CHF (LVEF 10-15%) admitted 2/18 for dyspnea, CAP vs CHF exacerbation. Hypoxic morning of 2/20 PCCM asked to see  SIGNIFICANT EVENTS / STUDIES:  09/16/2012 - Echo 2 > LVEF 10-15%.  2/18 Admitted 2/21 remains on BiPAP 2/22- improved resp status  LINES / TUBES: Tunneled subclavian CVL - from home 10/07/2012 >>>  CULTURES: 2/19 C-diff > neg  2/18 Blood >>>  Resp 2/18 >neg  ANTIBIOTICS: azithro 2/18 >>>  Rocephin 2/18 >>>  SUBJECTIVE: feeling worse this morning, when asked what he means he states he feels like he's not going to be around for much longer.  VITAL SIGNS: Temp:  [98 F (36.7 C)-98.8 F (37.1 C)] 98 F (36.7 C) (02/22 0410) Pulse Rate:  [70-108] 76 (02/22 0617) Resp:  [16-33] 23 (02/22 0617) BP: (60-100)/(31-66) 96/40 mmHg (02/22 0600) SpO2:  [75 %-98 %] 96 % (02/22 0617) FiO2 (%):  [60 %-100 %] 65 % (02/22 0600) Weight:  [77.6 kg (171 lb 1.2 oz)] 77.6 kg (171 lb 1.2 oz) (02/22 0400) HEMODYNAMICS: CVP:  [3 mmHg-6 mmHg] 5 mmHg VENTILATOR SETTINGS: Vent Mode:  [-] BIPAP;PCV FiO2 (%):  [60 %-100 %] 65 % Set Rate:  [10 bmp] 10 bmp PEEP:  [8 cmH20-10 cmH20] 10 cmH20 INTAKE / OUTPUT: Intake/Output     02/21 0701 - 02/22 0700 02/22 0701 - 02/23 0700   P.O. 580    I.V. (mL/kg) 140.3 (1.8)    IV Piggyback 650    Total Intake(mL/kg) 1370.3 (17.7)    Urine (mL/kg/hr) 975 (0.5)    Stool 275 (0.1)    Total Output 1250     Net +120.3          Stool Occurrence 2 x      PHYSICAL EXAMINATION: General: NAD comfortable on mask  Neuro: Alert, answers questions appropriately  HEENT: [place]/AT, mask in place Cardiovascular: RRR  Lungs: coarse throughout  Abdomen: soft, non-tender, non-distended  Musculoskeletal: Intact, moves all  extremities.  Skin: Intact, no LE edema  LABS:  CBC  Recent Labs Lab 09/04/13 0501 09/05/13 0301 09/06/13 0431  WBC 19.2* 18.2* 16.3*  HGB 11.1* 11.9* 11.1*  HCT 32.5* 34.4* 33.0*  PLT 180 187 185   Coag's  Recent Labs Lab 09/04/13 0501 09/05/13 0301 09/06/13 0431  INR 2.24* 3.27* 4.94*   BMET  Recent Labs Lab 09/04/13 0501 09/05/13 0301 09/06/13 0431  NA 139 140 137  K 3.8 3.1* 3.0*  CL 101 98 96  CO2 24 28 31   BUN 37* 44* 68*  CREATININE 1.65* 1.64* 2.15*  GLUCOSE 133* 99 93   Electrolytes  Recent Labs Lab 09/04/13 0501 09/05/13 0301 09/05/13 1255 09/06/13 0431  CALCIUM 10.0 10.3  --  10.6*  MG  --   --  2.3  --   PHOS  --   --  3.1  --    Sepsis Markers  Recent Labs Lab 09/04/13 0821 09/06/13 0431  PROCALCITON 8.12 14.25   ABG  Recent Labs Lab 09/04/13 1930  PHART 7.467*  PCO2ART 38.7  PO2ART 57.8*   Liver Enzymes  Recent Labs Lab 09/03/13 0458  AST 68*  ALT 33  ALKPHOS 57  BILITOT 2.2*  ALBUMIN 3.1*   Cardiac Enzymes  Recent Labs Lab 09/02/13 1409  PROBNP 5151.0*   Glucose  Recent Labs Lab 09/04/13 2014 09/05/13 0006 09/05/13 0434 09/05/13 0708 09/05/13 1102 09/05/13 1504  GLUCAP 91 91 96 93 94 96    Imaging Dg Chest Port 1 View  09/05/2013   CLINICAL DATA:  Follow-up pulmonary edema.  EXAM: PORTABLE CHEST - 1 VIEW  COMPARISON:  DG CHEST 1V PORT dated 09/04/2013; DG CHEST 2 VIEW dated 09/02/2013; DG CHEST 2 VIEW dated 07/21/2013  FINDINGS: Interval improvement in the diffuse interstitial and airspace pulmonary edema since the examination yesterday, though moderate to severe airspace opacities persist throughout both lungs. No new pulmonary parenchymal abnormality. Right jugular tunneled central venous catheter tip projects over the lower SVC at or near the cavoatrial junction, unchanged. Left subclavian pacing defibrillator unchanged.  IMPRESSION: Improvement in the CHF since yesterday, though moderate to severe  interstitial and airspace pulmonary edema persists. No new abnormalities.   Electronically Signed   By: Hulan Saashomas  Lawrence M.D.   On: 09/05/2013 16:54   Dg Chest Port 1 View  09/04/2013   CLINICAL DATA:  Evaluate pulmonary edema ; leukocytosis, fever  EXAM: PORTABLE CHEST - 1 VIEW  COMPARISON:  Portable exam 0907 hr compared to 09/02/2013  FINDINGS: Right jugular central venous catheter with tip projecting over SVC.  Left subclavian AICD leads project over right atrium and right ventricle.  Enlargement of cardiac silhouette with pulmonary vascular congestion.  Diffuse bilateral airspace infiltrates significantly increased since previous exam could represent pulmonary edema or pneumonia.  No gross pleural effusion or pneumothorax.  IMPRESSION: Enlargement of cardiac silhouette with pulmonary vascular congestion post AICD.  Significantly increased bilateral airspace infiltrates which could represent pulmonary edema or pneumonia.   Electronically Signed   By: Ulyses SouthwardMark  Boles M.D.   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 Intravascular depletion likely 2/22 P:  Cards following - appreciate their help Coumadin per pharmacy - elevated again, hold further, see heme Hold lasix today with CVP 5 and arf Monitor CVP further Continue milrinone per cards Continue digoxin, amiodarone, and sildenafil HR 76, arf, assess dig level Metolazone dc Aspirin  If MAp less 55 add levophed  RENAL A:  AKI on CKD 2 -  worsening with lasix Hypokalemia P:   Lasix - dc Dc metalazone Trend Cr Replete K F/u mag, phos  Allow pos balance  GASTROINTESTINAL A:  constipation P:   On home senna Mag oxide daily maalox Start full liquids  HEMATOLOGIC A:  Leukocytosis - potentially related to PNA - improving Anemia - improving Super there coumadin, no bleeding P:  Monitor CBC No role vit k today, repeat coags in am   INFECTIOUS A:  CAP, with elevated pct as crt rising, improve clinically P:   pcxr in am  procalcitonin algorithm - increased to 14.25 today - would continue antibiotics  ENDOCRINE A:  No history DM   P:   Monitor CBGs, if >150 start SSI  NEUROLOGIC A:  No acute issues P:  Monitor mental status  Marikay Alar, MD [facility] PGY-2  TODAY'S SUMMARY: hold lasix, wean off BiPAP, mandatory off time, keep abx, keep in icu , monitor BP, family requests continues aggressive medical care    I have personally obtained a history, examined the patient, evaluated laboratory and imaging results, formulated the assessment and plan and placed orders. CRITICAL CARE: The patient is critically ill with multiple organ systems failure and requires high complexity decision making for assessment and support, frequent evaluation and titration of therapies, application of advanced monitoring technologies and extensive interpretation of multiple databases. Critical Care Time devoted to patient care services described in this note is 30 minutes.   Mcarthur Rossetti. Tyson Alias, MD, FACP Pgr: [phone] [facility]   Pulmonary and Critical Care Medicine [facility] Pager: [phone]  09/06/2013, 7:01 AM

## 2013-09-06 NOTE — Progress Notes (Signed)
Rt placed pt on bipap with previous settings. No complications. Vital signs stable at this time. RT will continue to monitor.

## 2013-09-07 ENCOUNTER — Telehealth: Payer: Self-pay | Admitting: Internal Medicine

## 2013-09-07 ENCOUNTER — Inpatient Hospital Stay (HOSPITAL_COMMUNITY): Payer: Medicare Other

## 2013-09-07 LAB — CBC
HCT: 30.3 % — ABNORMAL LOW (ref 39.0–52.0)
Hemoglobin: 10.4 g/dL — ABNORMAL LOW (ref 13.0–17.0)
MCH: 31 pg (ref 26.0–34.0)
MCHC: 34.3 g/dL (ref 30.0–36.0)
MCV: 90.2 fL (ref 78.0–100.0)
Platelets: 225 10*3/uL (ref 150–400)
RBC: 3.36 MIL/uL — AB (ref 4.22–5.81)
RDW: 14.8 % (ref 11.5–15.5)
WBC: 13.5 10*3/uL — AB (ref 4.0–10.5)

## 2013-09-07 LAB — PROTIME-INR
INR: 5.85 (ref 0.00–1.49)
Prothrombin Time: 47 seconds — ABNORMAL HIGH (ref 11.6–15.2)

## 2013-09-07 LAB — BASIC METABOLIC PANEL
BUN: 89 mg/dL — ABNORMAL HIGH (ref 6–23)
CHLORIDE: 100 meq/L (ref 96–112)
CO2: 28 meq/L (ref 19–32)
Calcium: 10.9 mg/dL — ABNORMAL HIGH (ref 8.4–10.5)
Creatinine, Ser: 2.27 mg/dL — ABNORMAL HIGH (ref 0.50–1.35)
GFR calc Af Amer: 32 mL/min — ABNORMAL LOW (ref >90)
GFR calc non Af Amer: 27 mL/min — ABNORMAL LOW (ref >90)
GLUCOSE: 80 mg/dL (ref 70–99)
POTASSIUM: 3.8 meq/L (ref 3.7–5.3)
SODIUM: 139 meq/L (ref 137–147)

## 2013-09-07 LAB — CARBOXYHEMOGLOBIN
CARBOXYHEMOGLOBIN: 1.7 % — AB (ref 0.5–1.5)
Methemoglobin: 1.4 % (ref 0.0–1.5)
O2 SAT: 76.6 %
Total hemoglobin: 19.9 g/dL — ABNORMAL HIGH (ref 13.5–18.0)

## 2013-09-07 LAB — VANCOMYCIN, TROUGH: VANCOMYCIN TR: 26.5 ug/mL — AB (ref 10.0–20.0)

## 2013-09-07 MED ORDER — DEXTROSE 5 % IV SOLN
1.0000 g | INTRAVENOUS | Status: AC
  Administered 2013-09-07 – 2013-09-08 (×2): 1 g via INTRAVENOUS
  Filled 2013-09-07 (×2): qty 1

## 2013-09-07 NOTE — Telephone Encounter (Signed)
Pt currently admitted.

## 2013-09-07 NOTE — Progress Notes (Signed)
UR Completed.  Marigold Mom Jane [phone] 09/07/2013  

## 2013-09-07 NOTE — Progress Notes (Signed)
Patient ID: Gregg [name], male   DOB: [birth year], 73 y.o.   MRN: [id number]   SUBJECTIVE: [name] is a 73 y.o. 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 On 04/02/12 underwent neurocognitive testing.and is not a candidate for advanced therapies due to results for neurocognitive results. Maintained on home milrinone.   ECHO 09/2012: EF 10-15% RV moderately HK moderate MR   Admitted with respiratory failure thought primarily due to PNA with component of HF. Moved to MICU due to respiratory distress. Off BipAP this am. Now on FM. Breathing better. + cough.  Over the weekend diuretics have been held due to elevated creatinine.   Creatinine 1.6>2.15>2.27 Blood Cultures NGTD C-diff negative   CVP 5 Co-ox 77%. CXR clearing  Scheduled Meds: . amiodarone  200 mg Oral Daily  . antiseptic oral rinse  15 mL Mouth Rinse q12n4p  . aspirin EC  81 mg Oral Daily  . benzonatate  200 mg Oral TID  . ceFEPime (MAXIPIME) IV  1 g Intravenous Q24H  . chlorhexidine  15 mL Mouth Rinse BID  . digoxin  0.0625 mg Oral Daily  . ferrous fumarate-b12-vitamic C-folic acid  1 capsule Oral Q lunch  . guaiFENesin  600 mg Oral BID  . loratadine  10 mg Oral Daily  . magnesium oxide  1,000 mg Oral BID  . pantoprazole  40 mg Oral Daily  . pyridOXINE  100 mg Oral Q lunch  . sildenafil  20 mg Oral TID  . sodium chloride  3 mL Intravenous Q12H  . vancomycin  750 mg Intravenous Q12H  . warfarin  1 each Does not apply Once  . Warfarin - Pharmacist Dosing Inpatient   Does not apply q1800   Continuous Infusions: . milrinone 0.25 mcg/kg/min (09/07/13 0042)   PRN Meds:.albuterol, alum & mag hydroxide-simeth, promethazine    Filed Vitals:   09/07/13 0440 09/07/13  0500 09/07/13 0600 09/07/13 0700  BP:  104/48  104/54  Pulse:  69 70 70  Temp: 98 F (36.7 C)     TempSrc: Oral     Resp:  25 27 23   Height:      Weight:  174 lb 13.2 oz (79.3 kg)    SpO2:  99% 96% 100%    Intake/Output Summary (Last 24 hours) at 09/07/13 0733 Last data filed at 09/07/13 0700  Gross per 24 hour  Intake 1019.4 ml  Output    775 ml  Net  244.4 ml    LABS: Basic Metabolic Panel:  Recent Labs  31/59/45 1255 09/06/13 0431 09/06/13 1003 09/07/13 0237  NA  --  137  --  139  K  --  3.0*  --  3.8  CL  --  96  --  100  CO2  --  31  --  28  GLUCOSE  --  93  --  80  BUN  --  68*  --  89*  CREATININE  --  2.15*  --  2.27*  CALCIUM  --  10.6*  --  10.9*  MG 2.3  --  2.7*  --   PHOS 3.1  --  3.6  --    Liver Function Tests: No results found for this basename: AST, ALT, ALKPHOS, BILITOT, PROT, ALBUMIN,  in the last 72 hours No results found for this basename: LIPASE, AMYLASE,  in the last 72 hours CBC:  Recent Labs  09/06/13 0431 09/07/13 0237  WBC 16.3* 13.5*  HGB 11.1* 10.4*  HCT 33.0* 30.3*  MCV 90.9 90.2  PLT 185 225   Cardiac Enzymes: No results found for this basename: CKTOTAL, CKMB, CKMBINDEX, TROPONINI,  in the last 72 hours BNP: No components found with this basename: POCBNP,  D-Dimer: No results found for this basename: DDIMER,  in the last 72 hours Hemoglobin A1C: No results found for this basename: HGBA1C,  in the last 72 hours Fasting Lipid Panel: No results found for this basename: CHOL, HDL, LDLCALC, TRIG, CHOLHDL, LDLDIRECT,  in the last 72 hours Thyroid Function Tests: No results found for this basename: TSH, T4TOTAL, FREET3, T3FREE, THYROIDAB,  in the last 72 hours Anemia Panel: No results found for this basename: VITAMINB12, FOLATE, FERRITIN, TIBC, IRON, RETICCTPCT,  in the last 72 hours  RADIOLOGY: Dg Chest 2 View  09/02/2013   CLINICAL DATA:  Cough and emesis.  Joint pain  EXAM: CHEST  2 VIEW  COMPARISON:  07/21/2013   FINDINGS: Stable appearance of dual-chamber left approach ICD/pacer. Right IJ central venous catheter is in stable position. Cardiomegaly which is also stable. New perihilar airspace disease, most dense on the right. No effusion or interstitial coarsening. No pneumothorax. No acute osseous findings.  IMPRESSION: Perihilar airspace disease is concerning for pneumonia given the focal appearance. Pulmonary edema can also have this appearance.   Electronically Signed   By: Tiburcio PeaJonathan  Watts M.D.   On: 09/02/2013 14:13   Dg Chest Port 1 View  09/05/2013   CLINICAL DATA:  Follow-up pulmonary edema.  EXAM: PORTABLE CHEST - 1 VIEW  COMPARISON:  DG CHEST 1V PORT dated 09/04/2013; DG CHEST 2 VIEW dated 09/02/2013; DG CHEST 2 VIEW dated 07/21/2013  FINDINGS: Interval improvement in the diffuse interstitial and airspace pulmonary edema since the examination yesterday, though moderate to severe airspace opacities persist throughout both lungs. No new pulmonary parenchymal abnormality. Right jugular tunneled central venous catheter tip projects over the lower SVC at or near the cavoatrial junction, unchanged. Left subclavian pacing defibrillator unchanged.  IMPRESSION: Improvement in the CHF since yesterday, though moderate to severe interstitial and airspace pulmonary edema persists. No new abnormalities.   Electronically Signed   By: Hulan Saashomas  Lawrence M.D.   On: 09/05/2013 16:54   Dg Chest Port 1 View  09/04/2013   CLINICAL DATA:  Evaluate pulmonary edema ; leukocytosis, fever  EXAM: PORTABLE CHEST - 1 VIEW  COMPARISON:  Portable exam 0907 hr compared to 09/02/2013  FINDINGS: Right jugular central venous catheter with tip projecting over SVC.  Left subclavian AICD leads project over right atrium and right ventricle.  Enlargement of cardiac silhouette with pulmonary vascular congestion.  Diffuse bilateral airspace infiltrates significantly increased since previous exam could represent pulmonary edema or pneumonia.  No gross  pleural effusion or pneumothorax.  IMPRESSION: Enlargement of cardiac silhouette with pulmonary vascular congestion post AICD.  Significantly increased bilateral airspace infiltrates which could represent pulmonary edema or pneumonia.   Electronically Signed   By: Ulyses SouthwardMark  Boles M.D.   On: 09/04/2013 09:21    PHYSICAL EXAM General: NAD Neck: No JVD, no thyromegaly or thyroid nodule.  Lungs: mild crackles CV: Nondisplaced PMI.  Heart irregular S1/S2, no S3/S4, no murmur.  No peripheral edema.  No carotid bruit.  Normal pedal pulses.  Abdomen: Soft, nontender, no hepatosplenomegaly, no distention.  Neurologic:  Alert and oriented x 3.  Psych: Normal affect. Extremities: No clubbing or cyanosis.   TELEMETRY: Vpacing HR 70s underlying rhythm unclear  Assessment:   1) A/C systolic HF  - EF 10-15% (09/2012)  - on home milrinone 0.25  2) A/C respiratory failure  3) CAP ?  4) Afib s/p DC-CV 3/14 to NSR.   - on Coumadin  5) Acute on chronic renal failure 6) DNR   Plan/Discussion:    Continues to improve. Suspect main issue was PNA. Diuretics held with CVP 5 and worsening creatinine. Would continue to hold lasix. Co-ox good on milrinone would continue at home dose.   Amio toxicity a consideration but CXR clearing so less likely.   Had AFL yesterday. Atrial rhythm unclear today on tele - will repeat ECG. Can consider DC-CV prior to discharge, if needed. INR 5.8 today. Will hold digoxin with rising creatinine.   Appreciate CCM care.   Yarrow Linhart,MD 8:48 AM

## 2013-09-07 NOTE — Telephone Encounter (Signed)
I called and spoke with pt spouse. She reports the reason she has not returned our call was bc pt was admitted to hospital same day. Nothing further needed

## 2013-09-07 NOTE — Progress Notes (Signed)
Critical Value:  INR    5.85  Called [facility] and reported value

## 2013-09-07 NOTE — Progress Notes (Addendum)
ANTICOAGULATION/ ANTIBIOTIC CONSULT NOTE - Follow Up Consult  Pharmacy Consult for warfarin/vancomycin Indication: atrial fibrillation, empiric PNA  No Known Allergies  Patient Measurements: Height: 5\' 10"  (177.8 cm) Weight: 174 lb 13.2 oz (79.3 kg) IBW/kg (Calculated) : 73   Vital Signs: Temp: 98 F (36.7 C) (02/23 0440) Temp src: Oral (02/23 0440) BP: 104/54 mmHg (02/23 0700) Pulse Rate: 70 (02/23 0700)  Labs:  Recent Labs  09/05/13 0301 09/06/13 0431 09/07/13 0237  HGB 11.9* 11.1* 10.4*  HCT 34.4* 33.0* 30.3*  PLT 187 185 225  LABPROT 32.1* 44.0* 47.0*  INR 3.27* 4.94* 5.85*  CREATININE 1.64* 2.15* 2.27*    Estimated Creatinine Clearance: 30.8 ml/min (by C-G formula based on Cr of 2.27).   Medications:  Scheduled:  . amiodarone  200 mg Oral Daily  . antiseptic oral rinse  15 mL Mouth Rinse q12n4p  . aspirin EC  81 mg Oral Daily  . benzonatate  200 mg Oral TID  . ceFEPime (MAXIPIME) IV  1 g Intravenous Q24H  . chlorhexidine  15 mL Mouth Rinse BID  . digoxin  0.0625 mg Oral Daily  . ferrous fumarate-b12-vitamic C-folic acid  1 capsule Oral Q lunch  . guaiFENesin  600 mg Oral BID  . loratadine  10 mg Oral Daily  . magnesium oxide  1,000 mg Oral BID  . pantoprazole  40 mg Oral Daily  . pyridOXINE  100 mg Oral Q lunch  . sildenafil  20 mg Oral TID  . sodium chloride  3 mL Intravenous Q12H  . vancomycin  750 mg Intravenous Q12H  . warfarin  1 each Does not apply Once  . Warfarin - Pharmacist Dosing Inpatient   Does not apply q1800    Assessment: 50 YOM on warfarin PTA for AFib admitted 09/02/2013 .   Pharmacy consulted to dose warfarin   His home dose was 5mg  daily except 7.5mg  on Tuesdays and Fridays. That home dosing was resumed here with daily INRs. INR has increased to over  this morning despite not receiving any warfarin x2d. Hgb slight trend down and plts have remained stable, no bleeding noted. Patient is on amiodarone, however this is a home  medication which should not be acutely affecting INR.  ID:ID: Vanc/Cefepime D#4 for HCAP. Afebrile/24h, WBC trend down PCT 8.12. HIV, Hepatitis panels neg  Vanc 2/18>>2/19; restart 2/20>>, trough 26.5 (est t1/2~23h) Zosyn 2/18>>2/19 Cefepime 2/20>> Roceph 2/19>>2/20 Azith 2/19>>2/20  2/18 Bld: ngtd 2/19 Cdiff PCR neg RSV neg MRSA PCR neg   Goal of Therapy:  INR 2-3 Monitor platelets by anticoagulation protocol: Yes Vancomycin trough 15-20 mcg/ml   Plan:  1. Hold tonight's warfarin dose 2. Continue daily INRs 3. Follow for s/s bleeding 4.  Hold vancomycin recheck trough with AML 2/24  Thank you for allowing pharmacy to be a part of this patients care team.  Lovenia Kim Pharm.D., BCPS Clinical Pharmacist 09/07/2013 7:35 AM Pager: [phone] Phone: [phone]

## 2013-09-07 NOTE — Progress Notes (Addendum)
Name: Gregg [name] MRN: [id number] DOB: [birth year]    ADMISSION DATE:  09/02/2013 CONSULTATION DATE:  09/04/2013  REFERRING MD :  Luciana Axe PRIMARY SERVICE: IM  CHIEF COMPLAINT:  Dyspnea/cough  BRIEF PATIENT DESCRIPTION: 73 year old male with CHF (LVEF 10-15%) admitted 2/18 for dyspnea, CAP vs CHF exacerbation. Hypoxic morning of 2/20 PCCM asked to see  SIGNIFICANT EVENTS / STUDIES:  09/16/2012 - Echo 2 > LVEF 10-15%.  2/18 Admitted 2/21 remains on BiPAP 2/22- improved resp status  LINES / TUBES: Tunneled subclavian CVL - from home 10/07/2012 >>>  CULTURES: 2/19 C-diff > neg  2/18 Blood >>>ng  Resp 2/18 >neg  ANTIBIOTICS: azithro 2/18 >>> 2/20 Rocephin 2/18 >>>2/20 Cefepime 2/20 >>>  SUBJECTIVE: did well overnight. Nursing reports no sources of reported bleeding. Afebrile Placed back on bipap this am (due to order , not WOb)  VITAL SIGNS: Temp:  [98 F (36.7 C)-98.6 F (37 C)] 98 F (36.7 C) (02/23 0440) Pulse Rate:  [67-80] 69 (02/23 0500) Resp:  [22-34] 25 (02/23 0500) BP: (93-106)/(42-56) 104/48 mmHg (02/23 0500) SpO2:  [91 %-100 %] 99 % (02/23 0500) FiO2 (%):  [60 %-65 %] 60 % (02/23 0500) Weight:  [174 lb 13.2 oz (79.3 kg)] 174 lb 13.2 oz (79.3 kg) (02/23 0500) HEMODYNAMICS: CVP:  [2 mmHg-4 mmHg] 2 mmHg VENTILATOR SETTINGS: Vent Mode:  [-] PCV;BIPAP FiO2 (%):  [60 %-65 %] 60 % Set Rate:  [10 bmp] 10 bmp PEEP:  [10 cmH20] 10 cmH20 INTAKE / OUTPUT: Intake/Output     02/22 0701 - 02/23 0700 02/23 0701 - 02/24 0700   P.O. 520    I.V. (mL/kg) 143.3 (1.8)    IV Piggyback 350    Total Intake(mL/kg) 1013.3 (12.8)    Urine (mL/kg/hr) 775 (0.4)    Stool     Total Output 775     Net +238.3          Stool Occurrence 2 x      PHYSICAL EXAMINATION: General: NAD comfortable on bipap Neuro: Alert, answers questions appropriately  HEENT: [place]/AT, bipap in place Cardiovascular: RRR  Lungs: coarse throughout  Abdomen: soft, non-tender, non-distended   Musculoskeletal: Intact, moves all extremities.  Skin: Intact, no LE edema  LABS:  CBC  Recent Labs Lab 09/05/13 0301 09/06/13 0431 09/07/13 0237  WBC 18.2* 16.3* 13.5*  HGB 11.9* 11.1* 10.4*  HCT 34.4* 33.0* 30.3*  PLT 187 185 225   Coag's  Recent Labs Lab 09/05/13 0301 09/06/13 0431 09/07/13 0237  INR 3.27* 4.94* 5.85*   BMET  Recent Labs Lab 09/05/13 0301 09/06/13 0431 09/07/13 0237  NA 140 137 139  K 3.1* 3.0* 3.8  CL 98 96 100  CO2 28 31 28   BUN 44* 68* 89*  CREATININE 1.64* 2.15* 2.27*  GLUCOSE 99 93 80   Electrolytes  Recent Labs Lab 09/05/13 0301 09/05/13 1255 09/06/13 0431 09/06/13 1003 09/07/13 0237  CALCIUM 10.3  --  10.6*  --  10.9*  MG  --  2.3  --  2.7*  --   PHOS  --  3.1  --  3.6  --    Sepsis Markers  Recent Labs Lab 09/04/13 0821 09/06/13 0431  PROCALCITON 8.12 14.25   ABG  Recent Labs Lab 09/04/13 1930  PHART 7.467*  PCO2ART 38.7  PO2ART 57.8*   Liver Enzymes  Recent Labs Lab 09/03/13 0458  AST 68*  ALT 33  ALKPHOS 57  BILITOT 2.2*  ALBUMIN 3.1*   Cardiac Enzymes  Recent Labs Lab 09/02/13 1409  PROBNP 5151.0*   Glucose  Recent Labs Lab 09/04/13 2014 09/05/13 0006 09/05/13 0434 09/05/13 0708 09/05/13 1102 09/05/13 1504  GLUCAP 91 91 96 93 94 96    Imaging Dg Chest Port 1 View  09/05/2013   CLINICAL DATA:  Follow-up pulmonary edema.  EXAM: PORTABLE CHEST - 1 VIEW  COMPARISON:  DG CHEST 1V PORT dated 09/04/2013; DG CHEST 2 VIEW dated 09/02/2013; DG CHEST 2 VIEW dated 07/21/2013  FINDINGS: Interval improvement in the diffuse interstitial and airspace pulmonary edema since the examination yesterday, though moderate to severe airspace opacities persist throughout both lungs. No new pulmonary parenchymal abnormality. Right jugular tunneled central venous catheter tip projects over the lower SVC at or near the cavoatrial junction, unchanged. Left subclavian pacing defibrillator unchanged.  IMPRESSION:  Improvement in the CHF since yesterday, though moderate to severe interstitial and airspace pulmonary edema persists. No new abnormalities.   Electronically Signed   By: Hulan Saashomas  Lawrence M.D.   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 Intravascular depletion likely 2/22 P:  Cards following -  Coumadin per pharmacy - held for high INR Continue milrinone per cards, co-ox improved Continue amiodarone, and sildenafil Digoxin held with renal function Aspirin    RENAL A:  AKI on CKD 2 - worsening off lasix now Hypokalemia P:   Lasix - hold due to rising cr Trend Cr   GASTROINTESTINAL A:  constipation P:   On home senna Mag oxide daily Continue full liquids  HEMATOLOGIC A:  Leukocytosis - potentially related to PNA - improving Anemia - declining Supratherapeutic INR on coumadin, ?bleed with dropping Hgb P:  Monitor CBC Follow INR No sources of bleeding noted by nursing  INFECTIOUS A:  CAP, with elevated pct as crt rising, improved clinically P:   continue cefepime x 7ds total, 2/24   ENDOCRINE A:  No history DM   P:   Monitor CBGs, if >150 start SSI  NEUROLOGIC A:  No acute issues P:   Monitor mental status  Marikay AlarEric Sonnenberg, MD [facility] PGY[facility]  TODAY'S SUMMARY: hold lasix, bipap to prn, monitor BP, transfer to sdu & back to IMTS  Care during the described time interval was provided by me and/or other providers on the critical care team.  I have reviewed this patient's available data, including medical history, events of note, physical examination and test results as part of my evaluation  CC time x 31  m  Shterna Laramee V.  [phone]

## 2013-09-08 LAB — BASIC METABOLIC PANEL
BUN: 88 mg/dL — AB (ref 6–23)
CALCIUM: 11.4 mg/dL — AB (ref 8.4–10.5)
CO2: 27 mEq/L (ref 19–32)
Chloride: 98 mEq/L (ref 96–112)
Creatinine, Ser: 1.81 mg/dL — ABNORMAL HIGH (ref 0.50–1.35)
GFR calc non Af Amer: 36 mL/min — ABNORMAL LOW (ref >90)
GFR, EST AFRICAN AMERICAN: 42 mL/min — AB (ref >90)
Glucose, Bld: 101 mg/dL — ABNORMAL HIGH (ref 70–99)
Potassium: 3.9 mEq/L (ref 3.7–5.3)
Sodium: 137 mEq/L (ref 137–147)

## 2013-09-08 LAB — PROCALCITONIN: Procalcitonin: 5.57 ng/mL

## 2013-09-08 LAB — CARBOXYHEMOGLOBIN
Carboxyhemoglobin: 1.6 % — ABNORMAL HIGH (ref 0.5–1.5)
METHEMOGLOBIN: 0.8 % (ref 0.0–1.5)
O2 Saturation: 67.2 %
TOTAL HEMOGLOBIN: 10.5 g/dL — AB (ref 13.5–18.0)

## 2013-09-08 LAB — PROTIME-INR
INR: 5.57 (ref 0.00–1.49)
Prothrombin Time: 49 seconds — ABNORMAL HIGH (ref 11.6–15.2)

## 2013-09-08 LAB — VANCOMYCIN, TROUGH: VANCOMYCIN TR: 20.2 ug/mL — AB (ref 10.0–20.0)

## 2013-09-08 MED ORDER — VANCOMYCIN HCL IN DEXTROSE 750-5 MG/150ML-% IV SOLN
750.0000 mg | INTRAVENOUS | Status: DC
  Administered 2013-09-08: 750 mg via INTRAVENOUS
  Filled 2013-09-08: qty 150

## 2013-09-08 NOTE — Progress Notes (Signed)
Name: Gregg [name] MRN: [id number] DOB: [birth year]    ADMISSION DATE:  09/02/2013 CONSULTATION DATE:  09/04/2013  REFERRING MD :  Luciana Axe PRIMARY SERVICE: IM  CHIEF COMPLAINT:  Dyspnea/cough  BRIEF PATIENT DESCRIPTION: 73 year old male with CHF (LVEF 10-15%) admitted 2/18 for dyspnea, CAP vs CHF exacerbation. Hypoxic morning of 2/20 PCCM asked to see  SIGNIFICANT EVENTS / STUDIES:  09/16/2012 - Echo 2 > LVEF 10-15%.  2/18 Admitted 2/21 remains on BiPAP 2/22- improved resp status  LINES / TUBES: Tunneled subclavian CVL - from home 10/07/2012 >>>  CULTURES: 2/19 C-diff > neg  2/18 Blood >>>ng  Resp 2/18 >neg  ANTIBIOTICS: azithro 2/18 >>> 2/20 Rocephin 2/18 >>>2/20 Cefepime 2/20 >>>  SUBJECTIVE:  Afebrile Stayed off bipap x 24h, on 6L  [place] oob to chair  VITAL SIGNS: Temp:  [97.8 F (36.6 C)-99.1 F (37.3 C)] 98.3 F (36.8 C) (02/24 1144) Pulse Rate:  [65-78] 71 (02/24 1300) Resp:  [14-29] 19 (02/24 1300) BP: (97-117)/(45-102) 103/47 mmHg (02/24 1300) SpO2:  [89 %-100 %] 92 % (02/24 1300) FiO2 (%):  [100 %] 100 % (02/24 0900) Weight:  [78.1 kg (172 lb 2.9 oz)] 78.1 kg (172 lb 2.9 oz) (02/24 0500) HEMODYNAMICS: CVP:  [7 mmHg-12 mmHg] 7 mmHg VENTILATOR SETTINGS: Vent Mode:  [-]  FiO2 (%):  [100 %] 100 % INTAKE / OUTPUT: Intake/Output     02/23 0701 - 02/24 0700 02/24 0701 - 02/25 0700   P.O. 960    I.V. (mL/kg) 149.4 (1.9) 36.6 (0.5)   IV Piggyback 50    Total Intake(mL/kg) 1159.4 (14.8) 36.6 (0.5)   Urine (mL/kg/hr) 1550 (0.8) 225 (0.4)   Total Output 1550 225   Net -390.6 -188.4        Stool Occurrence 6 x      PHYSICAL EXAMINATION: General: NAD comfortable on  [place] Neuro: Alert, answers questions appropriately  HEENT: [place]/AT Cardiovascular: RRR  Lungs: coarse throughout  Abdomen: soft, non-tender, non-distended  Musculoskeletal: Intact, moves all extremities.  Skin: Intact, no LE edema  LABS:  CBC  Recent Labs Lab 09/05/13 0301 09/06/13 0431  09/07/13 0237  WBC 18.2* 16.3* 13.5*  HGB 11.9* 11.1* 10.4*  HCT 34.4* 33.0* 30.3*  PLT 187 185 225   Coag's  Recent Labs Lab 09/06/13 0431 09/07/13 0237 09/08/13 0216  INR 4.94* 5.85* 5.57*   BMET  Recent Labs Lab 09/06/13 0431 09/07/13 0237 09/08/13 0216  NA 137 139 137  K 3.0* 3.8 3.9  CL 96 100 98  CO2 31 28 27   BUN 68* 89* 88*  CREATININE 2.15* 2.27* 1.81*  GLUCOSE 93 80 101*   Electrolytes  Recent Labs Lab 09/05/13 0301 09/05/13 1255 09/06/13 0431 09/06/13 1003 09/07/13 0237 09/08/13 0216  CALCIUM 10.3  --  10.6*  --  10.9* 11.4*  MG  --  2.3  --  2.7*  --   --   PHOS  --  3.1  --  3.6  --   --    Sepsis Markers  Recent Labs Lab 09/04/13 0821 09/06/13 0431 09/08/13 0216  PROCALCITON 8.12 14.25 5.57   ABG  Recent Labs Lab 09/04/13 1930  PHART 7.467*  PCO2ART 38.7  PO2ART 57.8*   Liver Enzymes  Recent Labs Lab 09/03/13 0458  AST 68*  ALT 33  ALKPHOS 57  BILITOT 2.2*  ALBUMIN 3.1*   Cardiac Enzymes  Recent Labs Lab 09/02/13 1409  PROBNP 5151.0*   Glucose  Recent Labs Lab 09/04/13 2014  09/05/13 0006 09/05/13 0434 09/05/13 0708 09/05/13 1102 09/05/13 1504  GLUCAP 91 91 96 93 94 96    Imaging Dg Chest Port 1 View  09/07/2013   CLINICAL DATA:  Shortness of breath.  Pulmonary edema.  EXAM: PORTABLE CHEST - 1 VIEW  COMPARISON:  Poor Single view of the chest 09/05/2013 and 09/04/2013.  FINDINGS: AICD and right IJ catheter in place. Cardiomegaly is again seen. Pulmonary edema persists but has improved. No pneumothorax or pleural effusion is identified.  IMPRESSION: Continued improvement in pulmonary edema.  No new abnormality.   Electronically Signed   By: Drusilla Kannerhomas  Dalessio M.D.   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 Intravascular depletion likely 2/22 P:  Cards following -  Coumadin per pharmacy - held for high INR Continue milrinone per cards, co-ox improved Continue amiodarone, and sildenafil Digoxin held with renal function Aspirin    RENAL A:  AKI on CKD 2 - worsening off lasix now Hypokalemia P:   Lasix - hold due to rising cr Trend Cr   GASTROINTESTINAL A:  constipation P:   On home senna Mag oxide daily Continue full liquids  HEMATOLOGIC A:  Leukocytosis - potentially related to PNA - improving Anemia - declining Supratherapeutic INR on coumadin, ?bleed with dropping Hgb P:  Monitor CBC Follow INR - expect to drop when off abx   INFECTIOUS A:  CAP, with elevated pct as crt rising, improved clinically P:   continue cefepime x 7ds total, 2/24       TODAY'S SUMMARY: hold lasix,on [place],  transfer to tele & PCCM to sign off   Care during the described time interval was provided by me and/or other providers on the critical care team.  I have reviewed this patient's available data, including medical history, events of note, physical examination and test results as part of my evaluation   Enzo Treu V.  [phone]

## 2013-09-08 NOTE — Progress Notes (Signed)
Patient ID: Gregg [name], male   DOB: 18-Oct-1940, 73 y.o.   MRN: [id number]   SUBJECTIVE: Gregg [name] is a 73 y.o. 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 On 04/02/12 underwent neurocognitive testing.and is not a candidate for advanced therapies due to results for neurocognitive results. Maintained on home milrinone.   ECHO 09/2012: EF 10-15% RV moderately HK moderate MR   Admitted with respiratory failure thought primarily due to PNA with component of HF. Moved to MICU due to respiratory distress. Off BipAP this am. Now on FM. Breathing better. + cough.  He has been off diuretics for several days due to elevated creatinine. Sats 95-100%  Creatinine 1.6>2.15>2.27>1.8 INR 5.57 Blood Cultures NGTD C-diff negative   CVP  Co-ox 67%. CVP 6-7  Scheduled Meds: . amiodarone  200 mg Oral Daily  . antiseptic oral rinse  15 mL Mouth Rinse q12n4p  . aspirin EC  81 mg Oral Daily  . benzonatate  200 mg Oral TID  . ceFEPime (MAXIPIME) IV  1 g Intravenous Q24H  . chlorhexidine  15 mL Mouth Rinse BID  . ferrous fumarate-b12-vitamic C-folic acid  1 capsule Oral Q lunch  . guaiFENesin  600 mg Oral BID  . loratadine  10 mg Oral Daily  . magnesium oxide  1,000 mg Oral BID  . pantoprazole  40 mg Oral Daily  . pyridOXINE  100 mg Oral Q lunch  . sildenafil  20 mg Oral TID  . sodium chloride  3 mL Intravenous Q12H  . warfarin  1 each Does not apply Once  . Warfarin - Pharmacist Dosing Inpatient   Does not apply q1800   Continuous Infusions: . milrinone 0.25 mcg/kg/min (09/07/13 1810)   PRN Meds:.albuterol, alum & mag hydroxide-simeth, promethazine    Filed Vitals:   09/08/13 0400 09/08/13 0433 09/08/13 0500 09/08/13 0600  BP: 97/45  110/57  110/53  Pulse: 72  74 78  Temp:  99 F (37.2 C)    TempSrc:  Oral    Resp: 18  23 24   Height:      Weight:   172 lb 2.9 oz (78.1 kg)   SpO2: 95%  99% 89%    Intake/Output Summary (Last 24 hours) at 09/08/13 7517 Last data filed at 09/08/13 0600  Gross per 24 hour  Intake 1153.3 ml  Output   1550 ml  Net -396.7 ml    LABS: Basic Metabolic Panel:  Recent Labs  00/17/49 1255  09/06/13 1003 09/07/13 0237 09/08/13 0216  NA  --   < >  --  139 137  K  --   < >  --  3.8 3.9  CL  --   < >  --  100 98  CO2  --   < >  --  28 27  GLUCOSE  --   < >  --  80 101*  BUN  --   < >  --  89* 88*  CREATININE  --   < >  --  2.27* 1.81*  CALCIUM  --   < >  --  10.9* 11.4*  MG 2.3  --  2.7*  --   --   PHOS 3.1  --  3.6  --   --   < > =  values in this interval not displayed. Liver Function Tests: No results found for this basename: AST, ALT, ALKPHOS, BILITOT, PROT, ALBUMIN,  in the last 72 hours No results found for this basename: LIPASE, AMYLASE,  in the last 72 hours CBC:  Recent Labs  09/06/13 0431 09/07/13 0237  WBC 16.3* 13.5*  HGB 11.1* 10.4*  HCT 33.0* 30.3*  MCV 90.9 90.2  PLT 185 225   Cardiac Enzymes: No results found for this basename: CKTOTAL, CKMB, CKMBINDEX, TROPONINI,  in the last 72 hours BNP: No components found with this basename: POCBNP,  D-Dimer: No results found for this basename: DDIMER,  in the last 72 hours Hemoglobin A1C: No results found for this basename: HGBA1C,  in the last 72 hours Fasting Lipid Panel: No results found for this basename: CHOL, HDL, LDLCALC, TRIG, CHOLHDL, LDLDIRECT,  in the last 72 hours Thyroid Function Tests: No results found for this basename: TSH, T4TOTAL, FREET3, T3FREE, THYROIDAB,  in the last 72 hours Anemia Panel: No results found for this basename: VITAMINB12, FOLATE, FERRITIN, TIBC, IRON, RETICCTPCT,  in the last 72 hours  RADIOLOGY: Dg Chest 2 View  09/02/2013   CLINICAL DATA:  Cough and emesis.  Joint pain  EXAM:  CHEST  2 VIEW  COMPARISON:  07/21/2013  FINDINGS: Stable appearance of dual-chamber left approach ICD/pacer. Right IJ central venous catheter is in stable position. Cardiomegaly which is also stable. New perihilar airspace disease, most dense on the right. No effusion or interstitial coarsening. No pneumothorax. No acute osseous findings.  IMPRESSION: Perihilar airspace disease is concerning for pneumonia given the focal appearance. Pulmonary edema can also have this appearance.   Electronically Signed   By: Tiburcio PeaJonathan  Watts M.D.   On: 09/02/2013 14:13   Dg Chest Port 1 View  09/05/2013   CLINICAL DATA:  Follow-up pulmonary edema.  EXAM: PORTABLE CHEST - 1 VIEW  COMPARISON:  DG CHEST 1V PORT dated 09/04/2013; DG CHEST 2 VIEW dated 09/02/2013; DG CHEST 2 VIEW dated 07/21/2013  FINDINGS: Interval improvement in the diffuse interstitial and airspace pulmonary edema since the examination yesterday, though moderate to severe airspace opacities persist throughout both lungs. No new pulmonary parenchymal abnormality. Right jugular tunneled central venous catheter tip projects over the lower SVC at or near the cavoatrial junction, unchanged. Left subclavian pacing defibrillator unchanged.  IMPRESSION: Improvement in the CHF since yesterday, though moderate to severe interstitial and airspace pulmonary edema persists. No new abnormalities.   Electronically Signed   By: Hulan Saashomas  Lawrence M.D.   On: 09/05/2013 16:54   Dg Chest Port 1 View  09/04/2013   CLINICAL DATA:  Evaluate pulmonary edema ; leukocytosis, fever  EXAM: PORTABLE CHEST - 1 VIEW  COMPARISON:  Portable exam 0907 hr compared to 09/02/2013  FINDINGS: Right jugular central venous catheter with tip projecting over SVC.  Left subclavian AICD leads project over right atrium and right ventricle.  Enlargement of cardiac silhouette with pulmonary vascular congestion.  Diffuse bilateral airspace infiltrates significantly increased since previous exam could represent  pulmonary edema or pneumonia.  No gross pleural effusion or pneumothorax.  IMPRESSION: Enlargement of cardiac silhouette with pulmonary vascular congestion post AICD.  Significantly increased bilateral airspace infiltrates which could represent pulmonary edema or pneumonia.   Electronically Signed   By: Ulyses SouthwardMark  Boles M.D.   On: 09/04/2013 09:21    PHYSICAL EXAM CVP 7 General: NAD Neck: JVD 6-7, no thyromegaly or thyroid nodule.  Lungs: mild crackles CV: Nondisplaced PMI.  Heart irregular S1/S2, no S3/S4,  no murmur.  No peripheral edema.  No carotid bruit.  Normal pedal pulses.  Abdomen: Soft, nontender, no hepatosplenomegaly, no distention.  Neurologic: Alert and oriented x 3.  Psych: Normal affect. Extremities: No clubbing or cyanosis.   TELEMETRY: Vpacing HR 70s underlying rhythm unclear  Assessment:   1) A/C systolic HF  - EF 10-15% (09/2012)  - on home milrinone 0.25  2) A/C respiratory failure  3) CAP ?  4) Afib s/p DC-CV 3/14 to NSR.   - on Coumadin  5) Acute on chronic renal failure 6) DNR   Plan/Discussion:    Continues to improve. Suspect main issue was PNA. Hold lasix one more day. CVP 7. CO-OX 67%. Continue Milrinone would continue at home dose. Renal function trending down.   Amio toxicity a consideration but CXR clearing so less likely. Hopefully can wean oxygen.   Consult PT. Continue to mobilize.   Appreciate CCM care.   CLEGG,AMY, NP-C  7:12 AM  Patient seen and examined with Tonye Becket, NP. We discussed all aspects of the encounter. I agree with the assessment and plan as stated above.   Improving. O2 requirement coming down. Will try switch to [place]. Renal function improving. CVP 6-7. Will hold diuretics one more day and restart tomorrow. Continue milrinone. PT to see.  D/w DR. Alva.  Daniel Bensimhon,MD 11:02 AM

## 2013-09-08 NOTE — Evaluation (Signed)
Physical Therapy Evaluation Patient Details Name: Gregg [name] MRN: [id number] DOB: 04-16-1941 Today's Date: 09/08/2013 Time: 1202-1225 PT Time Calculation (min): 23 min  PT Assessment / Plan / Recommendation History of Present Illness  Gregg [name] is a 73 y.o. 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 Admitted with respiratory failure thought primarily due to PNA with component of HF  Clinical Impression  Pt pleasant but with decreased cognition stating he was wet but had not called out for RN. Linens were soaked in urine with pt cleaned, gown changed and RN notified. Pt demonstrates decreased activity tolerance, cognition and balance as well as limited activity tolerance who will benefit from acute therapy to maximize mobility in order to return pt to PLOF and decrease burden of care. Recommend daily mobility with nursing assist. Pt with sats dropping to 87% on 6L [place] and placed pt on NRB 15L with sats 90-97% with ambulation with cues for breathing technique and posture throughout. Back on 6L end of session 92%.     PT Assessment  Patient needs continued PT services    Follow Up Recommendations  Home health PT;Supervision/Assistance - 24 hour    Does the patient have the potential to tolerate intense rehabilitation      Barriers to Discharge        Equipment Recommendations  Rolling walker with 5" wheels    Recommendations for Other Services     Frequency Min 3X/week    Precautions / Restrictions Precautions Precautions: Fall   Pertinent Vitals/Pain No pain HR 73 BP 106/55 end of session      Mobility  Bed Mobility Overal bed mobility: Needs Assistance Bed Mobility: Supine to Sit Supine to sit: Min assist General bed mobility comments: cueing for sequence with assist to elevate trunk from surface Transfers Overall transfer level:  Needs assistance Equipment used: Rolling walker (2 wheeled) Transfers: Sit to/from Stand Sit to Stand: Min assist General transfer comment: cueing for hand placement and safety as pt grabbing RW throughout despite cueing Ambulation/Gait Ambulation/Gait assistance: Min guard Ambulation Distance (Feet): 200 Feet Assistive device: Rolling walker (2 wheeled) Gait Pattern/deviations: Step-through pattern;Decreased stride length Gait velocity interpretation: Below normal speed for age/gender General Gait Details: cueing throughout for posture, directional cues and safety    Exercises     PT Diagnosis: Difficulty walking  PT Problem List: Decreased activity tolerance;Decreased mobility;Cardiopulmonary status limiting activity;Decreased knowledge of use of DME;Decreased safety awareness PT Treatment Interventions: Gait training;Stair training;Functional mobility training;Therapeutic activities;Therapeutic exercise;Patient/family education;DME instruction;Cognitive remediation     PT Goals(Current goals can be found in the care plan section) Acute Rehab PT Goals Patient Stated Goal: return home PT Goal Formulation: With patient Time For Goal Achievement: 09/22/13 Potential to Achieve Goals: Good  Visit Information  Last PT Received On: 09/08/13 Assistance Needed: +1 History of Present Illness: Gregg [name] is a 73 y.o. 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 Admitted with respiratory failure thought primarily due to PNA with component of HF       Prior Functioning  Home Living Family/patient expects to be discharged to:: Private residence Living Arrangements: Spouse/significant other Available Help at Discharge: Family;Available 24 hours/day Type of Home: House Home Access: Stairs to enter [employer] of Steps: 5 Home Layout: Two  level;Laundry or work area in  basement;Able to live on main level with bedroom/bathroom Home Equipment: None Prior Function Level of Independence: Independent Communication Communication: No difficulties Dominant Hand: Right    Cognition  Cognition Arousal/Alertness: Awake/alert Behavior During Therapy: WFL for tasks assessed/performed Overall Cognitive Status: Impaired/Different from baseline Area of Impairment: Safety/judgement;Awareness Memory: Decreased short-term memory Safety/Judgement: Decreased awareness of safety;Decreased awareness of deficits General Comments: pt initially lethargic and increased time to engage but more alert once standing    Extremity/Trunk Assessment Lower Extremity Assessment Lower Extremity Assessment: RLE deficits/detail;LLE deficits/detail RLE Deficits / Details: 4+/5 bil hip flexion, knee flexion and extension LLE Deficits / Details: 4+/5 bil hip flexion, knee flexion and extension Cervical / Trunk Assessment Cervical / Trunk Assessment: Normal   Balance    End of Session PT - End of Session Equipment Utilized During Treatment: Gait belt Activity Tolerance: Patient tolerated treatment well Patient left: in chair;with call bell/phone within reach;with nursing/sitter in room Nurse Communication: Mobility status;Precautions  GP     Delorse Lekabor, Javeria Briski Beth 09/08/2013, 1:10 PM Delaney MeigsMaija Tabor Reshanda Lewey, PT [phone]

## 2013-09-08 NOTE — Progress Notes (Signed)
Patient transferred from 63M, noted order to transfer to Telemetry not Step down.Transfering RN Carlye Grippe made aware and will clarify order with MD

## 2013-09-08 NOTE — Progress Notes (Signed)
  Date: 09/08/2013  Patient name: Gregg [name]  Medical record number: [id number]  Date of birth: 1940-12-14   This patient has been seen and the plan of care was discussed with the house staff. Please see their note for complete details. I concur with their findings with the following additions/corrections:  Appreciate heart failure team and CCM in management with this difficult patient.  Has an improving CXR of edema vs pneumonia.  Transferred to ICU and to go back out to SDU today.  Will continue with cefepime for possible HCAP.  No inidication for vancomycin with no positive cultures.    Gardiner Barefoot, MD 09/08/2013, 12:36 PM

## 2013-09-08 NOTE — Progress Notes (Signed)
I have seen the patient and reviewed the daily progress note by Tally Due MS IV and discussed the care of the patient with them.  Please see my progress note for my findings, assessment, and plans/additions.

## 2013-09-08 NOTE — Progress Notes (Signed)
SIGNIFICANT EVENTS / STUDIES:  09/02/2013 >> Admitted to SDU 09/04/2013 >> Transferred to ICU, changed to DNR 09/16/2012 - Echo 2 > LVEF 10-15%.  2/18 Admitted  2/21 remains on BiPAP  2/22- improved resp status  2/23 > transferred back to the teaching service   LINES / TUBES:  Tunneled subclavian CVL - from home 10/07/2012 >>>   CULTURES:  2/19 C-diff > neg  2/18 Blood >>>ng  Resp 2/18 >neg   ANTIBIOTICS:  azithro 2/18 >>> 2/20  Rocephin 2/18 >>>2/20  Cefepime 2/20 >> Vanc 2/20 >> 2/24  Subjective:  Patient reports that he is feeling better today. No overnight events.  Objective: Vital signs in last 24 hours: Filed Vitals:   09/08/13 0433 09/08/13 0500 09/08/13 0600 09/08/13 0900  BP:  110/57 110/53   Pulse:  74 78   Temp: 99 F (37.2 C)   98.2 F (36.8 C)  TempSrc: Oral   Oral  Resp:  23 24   Height:      Weight:  172 lb 2.9 oz (78.1 kg)    SpO2:  99% 89%    Weight change: -2 lb 10.3 oz (-1.2 kg)  Intake/Output Summary (Last 24 hours) at 09/08/13 1129 Last data filed at 09/08/13 1100  Gross per 24 hour  Intake  919.4 ml  Output   1425 ml  Net -505.6 ml   General: appears less dyspneic, on NRB and saturating at 100%.desats to mid-80s off oxygen Cardiac: RRR, no rubs, murmurs or gallops Pulm: decreased air mvmt bilaterally  Abd: soft, nontender, nondistended, BS normoactive Ext: warm and well perfused, no pedal edema Neuro: alert and oriented X3  Lab Results: Basic Metabolic Panel:  Recent Labs Lab 09/05/13 1255  09/06/13 1003 09/07/13 0237 09/08/13 0216  NA  --   < >  --  139 137  K  --   < >  --  3.8 3.9  CL  --   < >  --  100 98  CO2  --   < >  --  28 27  GLUCOSE  --   < >  --  80 101*  BUN  --   < >  --  89* 88*  CREATININE  --   < >  --  2.27* 1.81*  CALCIUM  --   < >  --  10.9* 11.4*  MG 2.3  --  2.7*  --   --   PHOS 3.1  --  3.6  --   --   < > = values in this interval not displayed. Liver Function Tests:  Recent Labs Lab  09/03/13 0458  AST 68*  ALT 33  ALKPHOS 57  BILITOT 2.2*  PROT 6.4  ALBUMIN 3.1*   CBC:  Recent Labs Lab 09/02/13 1409  09/06/13 0431 09/07/13 0237  WBC 14.2*  < > 16.3* 13.5*  NEUTROABS 10.4*  --   --   --   HGB 12.2*  < > 11.1* 10.4*  HCT 36.1*  < > 33.0* 30.3*  MCV 92.6  < > 90.9 90.2  PLT 165  < > 185 225  < > = values in this interval not displayed. BNP:  Recent Labs Lab 09/02/13 1409  PROBNP 5151.0*   Coagulation:  Recent Labs Lab 09/05/13 0301 09/06/13 0431 09/07/13 0237 09/08/13 0216  LABPROT 32.1* 44.0* 47.0* 49.0*  INR 3.27* 4.94* 5.85* 5.57*   Studies/Results: Dg Chest Port 1 View  09/07/2013   CLINICAL DATA:  Shortness of  breath.  Pulmonary edema.  EXAM: PORTABLE CHEST - 1 VIEW  COMPARISON:  Poor Single view of the chest 09/05/2013 and 09/04/2013.  FINDINGS: AICD and right IJ catheter in place. Cardiomegaly is again seen. Pulmonary edema persists but has improved. No pneumothorax or pleural effusion is identified.  IMPRESSION: Continued improvement in pulmonary edema.  No new abnormality.   Electronically Signed   By: Drusilla Kanner M.D.   On: 09/07/2013 07:30   Medications: I have reviewed the patient's current medications. Scheduled Meds: . amiodarone  200 mg Oral Daily  . antiseptic oral rinse  15 mL Mouth Rinse q12n4p  . aspirin EC  81 mg Oral Daily  . benzonatate  200 mg Oral TID  . ceFEPime (MAXIPIME) IV  1 g Intravenous Q24H  . chlorhexidine  15 mL Mouth Rinse BID  . ferrous fumarate-b12-vitamic C-folic acid  1 capsule Oral Q lunch  . guaiFENesin  600 mg Oral BID  . loratadine  10 mg Oral Daily  . magnesium oxide  1,000 mg Oral BID  . pantoprazole  40 mg Oral Daily  . pyridOXINE  100 mg Oral Q lunch  . sildenafil  20 mg Oral TID  . sodium chloride  3 mL Intravenous Q12H  . vancomycin  750 mg Intravenous Q24H  . warfarin  1 each Does not apply Once  . Warfarin - Pharmacist Dosing Inpatient   Does not apply q1800   Continuous  Infusions: . milrinone 0.25 mcg/kg/min (09/08/13 1057)   PRN Meds:.albuterol, alum & mag hydroxide-simeth, promethazine  Assessment/Plan: Mr. Hawksley is a 73 yo M with sCHF, GERD, CKD, and OSA on CPAP who was admitted on 09/02/13 with worsening cough.  # Presumed CAP vs Pulmonary edema: Improved. Previously febrile, but now afebrile and respiratory status appears better today. Amiodarone toxicity has been mentioned but this is less likely given improvement.  pt scehduled for outpt PFTs 09/14/13 Plan -continue with cefepime - discont  Vanco. -Tessalon tid, Gauifenesin bid - albuterol prn - aim to change to [place] oxygen, otherwise cont with NRB - renal function improving   - consult PT  # Acute on chronic Systolic CHF:  Status post AICD placement. ECHO 09/2012: EF 10-15% RV moderately HK moderate MR. Patient follows with Dr. Gala Romney as outpatient. Has been Milrinone since 2013. 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 Baseline Cr ~1, but up up to 1.37 at admission the 1.9 >1.81. Likely due to prerenal and getting better off lasix. Plan  - BMT qam   #VTE ppx: coumadin per pharmacy  #Code: NDR/DNI   Dispo: Disposition is deferred at this time, awaiting improvement of current medical problems.    The patient does have a current PCP Exie Parody, MD) and does not need an [facility] hospital follow-up appointment after discharge.  The patient does not have transportation limitations that hinder transportation to clinic appointments.  .Services Needed at time of discharge: Y = Yes, Blank = No PT:   OT:   RN:   Equipment:   Other:     LOS: 6 days   Dow Adolph, MD 09/08/2013, 11:29 AM

## 2013-09-08 NOTE — Progress Notes (Signed)
Medical Student Daily Progress Note  Subjective: No events overnight. Patient is on 100% non-rebreather, but still desaturates to 80% once on room air. Patient is still having dry cough that leads to desaturation, but no nausea or vomiting.   BRIEF PATIENT DESCRIPTION: 73 year old male with CHF (LVEF 10-15%) admitted 2/18 for dyspnea, CAP vs CHF exacerbation. Hypoxic morning of 2/20 PCCM asked to see   SIGNIFICANT EVENTS / STUDIES:  09/16/2012 - Echo 2 > LVEF 10-15%.  2/18 Admitted  2/21 remains on BiPAP  2/22- improved resp status   LINES / TUBES:  Tunneled subclavian CVL - from home 10/07/2012 >>>   CULTURES:  2/19 C-diff > neg  2/18 Blood >>>ng  Resp 2/18 >neg   ANTIBIOTICS:  azithro 2/18 >>> 2/20  Rocephin 2/18 >>> 2/20  Cefepime 2/20 >>> 2/26 (planned)   Objective: Vital signs in last 24 hours: Filed Vitals:   09/08/13 0400 09/08/13 0433 09/08/13 0500 09/08/13 0600  BP: 97/45  110/57 110/53  Pulse: 72  74 78  Temp:  99 F (37.2 C)    TempSrc:  Oral    Resp: 18  23 24   Height:      Weight:   78.1 kg (172 lb 2.9 oz)   SpO2: 95%  99% 89%   Weight change: -1.2 kg (-2 lb 10.3 oz)  Intake/Output Summary (Last 24 hours) at 09/08/13 0853 Last data filed at 09/08/13 0600  Gross per 24 hour  Intake  907.2 ml  Output   1550 ml  Net -642.8 ml   Physical Exam: BP 110/53  Pulse 78  Temp(Src) 99 F (37.2 C) (Oral)  Resp 24  Ht 5' 10"  (1.778 m)  Wt 78.1 kg (172 lb 2.9 oz)  BMI 24.71 kg/m2  SpO2 89% General appearance: alert, cooperative and no distress Lungs: Diminished breath sounds bilaterally, but no crackles appreciated Heart: regular rate, but only one heart sound is heard, no murmurs Extremities: no peripheral edema bilaterally Lab Results: BMET    Component Value Date/Time   NA 137 09/08/2013 0216   K 3.9 09/08/2013 0216   CL 98 09/08/2013 0216   CO2 27 09/08/2013 0216   GLUCOSE 101* 09/08/2013 0216   BUN 88* 09/08/2013 0216   CREATININE 1.81* 09/08/2013  0216   CALCIUM 11.4* 09/08/2013 0216   GFRNONAA 36* 09/08/2013 0216   GFRAA 42* 09/08/2013 0216    CBC    Component Value Date/Time   WBC 13.5* 09/07/2013 0237   RBC 3.36* 09/07/2013 0237   HGB 10.4* 09/07/2013 0237   HCT 30.3* 09/07/2013 0237   PLT 225 09/07/2013 0237   MCV 90.2 09/07/2013 0237   MCH 31.0 09/07/2013 0237   MCHC 34.3 09/07/2013 0237   RDW 14.8 09/07/2013 0237   LYMPHSABS 1.8 09/02/2013 1409   MONOABS 1.8* 09/02/2013 1409   EOSABS 0.2 09/02/2013 1409   BASOSABS 0.0 09/02/2013 1409   Van troph: 20  Micro Results: Recent Results (from the past 240 hour(s))  CULTURE, BLOOD (ROUTINE X 2)     Status: None   Collection Time    09/02/13  9:45 PM      Result Value Ref Range Status   Specimen Description BLOOD RIGHT ARM   Final   Special Requests BOTTLES DRAWN AEROBIC AND ANAEROBIC 10CC EACH   Final   Culture  Setup Time     Final   Value: 09/03/2013 00:33     Performed at [facility]  Final   Value:        BLOOD CULTURE RECEIVED NO GROWTH TO DATE CULTURE WILL BE HELD FOR 5 DAYS BEFORE ISSUING A FINAL NEGATIVE REPORT     Performed at [facility]   Report Status PENDING   Incomplete  CULTURE, BLOOD (ROUTINE X 2)     Status: None   Collection Time    09/02/13  9:55 PM      Result Value Ref Range Status   Specimen Description BLOOD RIGHT HAND   Final   Special Requests BOTTLES DRAWN AEROBIC ONLY 10CC   Final   Culture  Setup Time     Final   Value: 09/03/2013 00:33     Performed at [facility]   Culture     Final   Value:        BLOOD CULTURE RECEIVED NO GROWTH TO DATE CULTURE WILL BE HELD FOR 5 DAYS BEFORE ISSUING A FINAL NEGATIVE REPORT     Performed at [facility]   Report Status PENDING   Incomplete  RESPIRATORY VIRUS PANEL     Status: None   Collection Time    09/02/13  9:55 PM      Result Value Ref Range Status   Source - RVPAN NASAL SWAB   Corrected   Comment: CORRECTED ON 02/20 AT 3833: PREVIOUSLY REPORTED AS NASAL  SWAB   Respiratory Syncytial Virus A NOT DETECTED   Final   Respiratory Syncytial Virus B NOT DETECTED   Final   Influenza A NOT DETECTED   Final   Influenza B NOT DETECTED   Final   Parainfluenza 1 NOT DETECTED   Final   Parainfluenza 2 NOT DETECTED   Final   Parainfluenza 3 NOT DETECTED   Final   Metapneumovirus NOT DETECTED   Final   Rhinovirus NOT DETECTED   Final   Adenovirus NOT DETECTED   Final   Influenza A H1 NOT DETECTED   Final   Influenza A H3 NOT DETECTED   Final   Comment: (NOTE)           Normal Reference Range for each Analyte: NOT DETECTED     Testing performed using the Luminex xTAG Respiratory Viral Panel test     kit.     This test was developed and its performance characteristics determined     by [facility]. It has not been cleared or approved by the [place]     Food and Drug Administration. This test is used for clinical purposes.     It should not be regarded as investigational or for research. This     laboratory is certified under the [place] (CLIA) as qualified to perform high complexity     clinical laboratory testing.     Performed at [facility] DIFFICILE BY PCR     Status: None   Collection Time    09/03/13  5:47 PM      Result Value Ref Range Status   C difficile by pcr NEGATIVE  NEGATIVE Final  MRSA PCR SCREENING     Status: None   Collection Time    09/03/13 10:34 PM      Result Value Ref Range Status   MRSA by PCR NEGATIVE  NEGATIVE Final   Comment:            The GeneXpert MRSA Assay (FDA     approved for NASAL  specimens     only), is one component of a     comprehensive MRSA colonization     surveillance program. It is not     intended to diagnose MRSA     infection nor to guide or     monitor treatment for     MRSA infections.   Studies/Results: Dg Chest Port 1 View  09/07/2013   CLINICAL DATA:  Shortness of breath.  Pulmonary edema.  EXAM: PORTABLE CHEST - 1  VIEW  COMPARISON:  Poor Single view of the chest 09/05/2013 and 09/04/2013.  FINDINGS: AICD and right IJ catheter in place. Cardiomegaly is again seen. Pulmonary edema persists but has improved. No pneumothorax or pleural effusion is identified.  IMPRESSION: Continued improvement in pulmonary edema.  No new abnormality.   Electronically Signed   By: Inge Rise M.D.   On: 09/07/2013 07:30   Medications: I have reviewed the patient's current medications. Scheduled Meds: . amiodarone  200 mg Oral Daily  . antiseptic oral rinse  15 mL Mouth Rinse q12n4p  . aspirin EC  81 mg Oral Daily  . benzonatate  200 mg Oral TID  . ceFEPime (MAXIPIME) IV  1 g Intravenous Q24H  . chlorhexidine  15 mL Mouth Rinse BID  . ferrous [id number] C-folic acid  1 capsule Oral Q lunch  . guaiFENesin  600 mg Oral BID  . loratadine  10 mg Oral Daily  . magnesium oxide  1,000 mg Oral BID  . pantoprazole  40 mg Oral Daily  . pyridOXINE  100 mg Oral Q lunch  . sildenafil  20 mg Oral TID  . sodium chloride  3 mL Intravenous Q12H  . warfarin  1 each Does not apply Once  . Warfarin - Pharmacist Dosing Inpatient   Does not apply q1800   Continuous Infusions: . milrinone 0.25 mcg/kg/min (09/07/13 1810)   PRN Meds:.albuterol, alum & mag hydroxide-simeth, promethazine Assessment/Plan: Principal Problem:   Acute on chronic systolic heart failure Active Problems:   Atrial fibrillation   Chronic systolic heart failure   Chronic cough   Sleep apnea   Pneumonia   Acute respiratory failure   Acute pulmonary edema   LOS: 6 days   Mr. Topper is a 73 yo AA man with history of severe CHF (EF 10-15%) with ICD and IV milrinone and not a candidate for heart transplant, Afib on warfarin, OSA, allergic rhinitis, and GERD who presents with acute CHF with possible underlying CAP. His hospital course is complicated with need to be on BiPAP, but has been improving clinically with aggressive diuresis, cefepime, and  vancomycin.   # Acute CHF: Patient has basline NYHA Class II Systolic CHF with non-ischemic CM treated with IV milrinon and digoxin at home, and he has ICD placed. Patient is now 172lb, down from 185lb at [place]. CXR on 09/07/2013 shows improving pulmonary edema. Stopped lasix on 2/23 due to worsening Cr to 2.25 and CVP is at 5. Cr is currently at 1.74 and CVP is 6-7; both are improving.  -Continue to hold lasix until 2/25 - Hold digoxin due to renal function. -Appreciate HF team recs -Continuous milrinone per pharmacy -Sidenalfil 16m TID -Aspirin 835mqD -Hold ARB/ACEi due to AKI -Daily weights  # Acute on chronic cough likely caused by CAP: Patient presented with severe cough with new infiltrates on admission CXR showing pulm edema vs. PNA. Blood culture has not shown any growth to date. Patient is on day 6 of antibiotics.   Antibiotics:  Azithro 2/19 >>> 2/20  Ceftriaxone 2/19 >>> 2/20  Cefepime 2/20 >>> 2/26 (planned) Vanc 2/20 >>> 2/26 (planned)  Plan:  -Continue cefepime and Vanc until 2/26 for a total of 7 day antibiotics treatment.  -Incentive spirometry -Try to put patient on [place] rather than 100% nonrebreather -PT consult -Albuterol Nebs q6H  -Mucinex 638m BID and tessalon 200 TID for cough  -Loratadine 137mfor allergies  -Protonix 4039mD  -Promethazine 6.40m53mjections q6H PRN   -Outpaitent PFT (09/14/13) to assess for obstructive/restrictive disease  -CBC in AM  # Atrial Fibrillation: Currently on amiodarone 200mg30mly and warfarin therapy. INR is very elevated to 5.57, but no signs of bleeding.  -Monitor for bleeding, check CBC -Warfarin per pharmacy -Amiodarone 200mg 63my -Check INR for therapy response  # AKI on CKD stage 2: current Cr is 1.74 with baseline at 1.3. Likely due to CHF or diuresis as renal function is improving as CHF is improving and when diuretics are held. -Avoid nephrotoxins  # Normocytic anemia: Hgb stable at 11.1 from 12.2. Likely  hemodilution from acute CHF. Pt with history of since 1 month ago. Etiology most likely due to CKD.  -Consider anemia panel  -Monitor for bleeding  -Monitor CBC   # Obstructive Sleep Apnea: Patient [facility]  # Allergic Rhinitis - Pt reports symptoms controlled on home anti-histamine therapy (home cetirizine 10 mg daily).  -Loratadine 10 mg daily (forumalary)  -Consider nasal corticosteroid, 1st line for AR   # GERD - currently without reflux symptoms. Pt on PPI (protonix) therapy at home.  -Protonix 40 mg daily   # Constipation - Currently without symptoms  -Continue home senna 8.6 mg daily  -Magnesium oxide daily  -Maalox 30mL q58mRN   # Diet: Heart healthy  # DVT Ppx: Coumadin per pharmacy, SCD's  # Code: DNR, DNI, POA is wife   This is a MedicalCare[employer] information officer The care of the patient was discussed with Dr. Comer aLinus Salmonse assessment and plan formulated with their assistance.  Please see their attached note for official documentation of the daily encounter.  Seng Larch, Konrad Saha015, 8:53 AM

## 2013-09-08 NOTE — Progress Notes (Signed)
ANTICOAGULATION/ ANTIBIOTIC CONSULT NOTE - Follow Up Consult  Pharmacy Consult for warfarin/vancomycin Indication: atrial fibrillation, empiric PNA  No Known Allergies  Patient Measurements: Height: 5\' 10"  (177.8 cm) Weight: 172 lb 2.9 oz (78.1 kg) IBW/kg (Calculated) : 73   Vital Signs: Temp: 98.3 F (36.8 C) (02/24 1144) Temp src: Oral (02/24 1144) BP: 103/47 mmHg (02/24 1300) Pulse Rate: 71 (02/24 1300)  Labs:  Recent Labs  09/06/13 0431 09/07/13 0237 09/08/13 0216  HGB 11.1* 10.4*  --   HCT 33.0* 30.3*  --   PLT 185 225  --   LABPROT 44.0* 47.0* 49.0*  INR 4.94* 5.85* 5.57*  CREATININE 2.15* 2.27* 1.81*    Estimated Creatinine Clearance: 38.7 ml/min (by C-G formula based on Cr of 1.81).   Medications:  Scheduled:  . amiodarone  200 mg Oral Daily  . antiseptic oral rinse  15 mL Mouth Rinse q12n4p  . aspirin EC  81 mg Oral Daily  . benzonatate  200 mg Oral TID  . ceFEPime (MAXIPIME) IV  1 g Intravenous Q24H  . chlorhexidine  15 mL Mouth Rinse BID  . ferrous fumarate-b12-vitamic C-folic acid  1 capsule Oral Q lunch  . guaiFENesin  600 mg Oral BID  . loratadine  10 mg Oral Daily  . magnesium oxide  1,000 mg Oral BID  . pantoprazole  40 mg Oral Daily  . pyridOXINE  100 mg Oral Q lunch  . sildenafil  20 mg Oral TID  . sodium chloride  3 mL Intravenous Q12H  . warfarin  1 each Does not apply Once  . Warfarin - Pharmacist Dosing Inpatient   Does not apply q1800    Assessment: 73 YOM on warfarin PTA for AFib admitted 09/02/2013 .   Pharmacy consulted to dose warfarin   His home dose was 5mg  daily except 7.5mg  on Tuesdays and Fridays. That home dosing was resumed here with daily INRs. INR has increased to over  5 despite not receiving any warfarin x3d. Hgb slight trend down and plts have remained stable, no bleeding noted. Patient is on amiodarone, however this is a home medication which should not be acutely affecting INR.  ID:  HCAP. Afebrile/24h, WBC trend  down PCT 5 trend down. HIV, Hepatitis panels neg  Cefepime 2/20>> 2/18 Bld: ngtd 2/19 Cdiff PCR neg RSV neg MRSA PCR neg   Goal of Therapy:  INR 2-3 Monitor platelets by anticoagulation protocol: Yes Renal adjustment of antibiotics.    Plan:  1. Hold tonight's warfarin dose 2. Continue daily INRs 3. Follow for s/s bleeding 4.  Continue cefepime, adjust as indicated  Thank you for allowing pharmacy to be a part of this patients care team.  Lovenia Kim Pharm.D., BCPS Clinical Pharmacist 09/08/2013 2:15 PM Pager: [phone] Phone: [phone]

## 2013-09-09 DIAGNOSIS — R197 Diarrhea, unspecified: Secondary | ICD-10-CM

## 2013-09-09 LAB — PROTIME-INR
INR: 4.29 — ABNORMAL HIGH (ref 0.00–1.49)
Prothrombin Time: 39.5 seconds — ABNORMAL HIGH (ref 11.6–15.2)

## 2013-09-09 LAB — CULTURE, BLOOD (ROUTINE X 2)
CULTURE: NO GROWTH
Culture: NO GROWTH

## 2013-09-09 LAB — BASIC METABOLIC PANEL
BUN: 75 mg/dL — AB (ref 6–23)
CALCIUM: 11.5 mg/dL — AB (ref 8.4–10.5)
CO2: 31 mEq/L (ref 19–32)
Chloride: 100 mEq/L (ref 96–112)
Creatinine, Ser: 1.42 mg/dL — ABNORMAL HIGH (ref 0.50–1.35)
GFR calc non Af Amer: 48 mL/min — ABNORMAL LOW (ref >90)
GFR, EST AFRICAN AMERICAN: 56 mL/min — AB (ref >90)
GLUCOSE: 94 mg/dL (ref 70–99)
POTASSIUM: 3.8 meq/L (ref 3.7–5.3)
SODIUM: 142 meq/L (ref 137–147)

## 2013-09-09 LAB — CARBOXYHEMOGLOBIN
CARBOXYHEMOGLOBIN: 2.2 % — AB (ref 0.5–1.5)
Methemoglobin: 1.4 % (ref 0.0–1.5)
O2 SAT: 75 %
TOTAL HEMOGLOBIN: 20.3 g/dL — AB (ref 13.5–18.0)

## 2013-09-09 NOTE — Progress Notes (Signed)
Medical Student Daily Progress Note  Subjective: Non-bloody watery diarrhea every 3 hours. Similar symptoms saw on admission, but C diff PCR at that time was negative.   BRIEF PATIENT DESCRIPTION: 73 year old male with CHF (LVEF 10-15%) admitted 2/18 for dyspnea, CAP vs CHF exacerbation. Hypoxic morning of 2/20 PCCM asked to see   SIGNIFICANT EVENTS / STUDIES:  09/16/2012 - Echo 2 > LVEF 10-15%.  2/18 Admitted  2/21 remains on BiPAP  2/22- improved resp status  2/25 off of 100% non-rebreather  LINES / TUBES:  Tunneled subclavian CVL - from home 10/07/2012 >>>   CULTURES:  2/19 C-diff > neg  2/18 Blood >>>ng  Resp 2/18 >neg   ANTIBIOTICS:  azithro 2/18 >>> 2/20  Rocephin 2/18 >>> 2/20  Cefepime 2/20 >>> 2/26 (planned) Vanc: 2/20>>>2/23   Objective: Vital signs in last 24 hours: Filed Vitals:   09/09/13 0330 09/09/13 0700 09/09/13 0805 09/09/13 1000  BP: 107/50 111/55 106/57 110/61  Pulse: 77 69 74 81  Temp: 98.4 F (36.9 C)  98.4 F (36.9 C)   TempSrc: Axillary  Oral   Resp: 17 27 33 22  Height:      Weight: 78.2 kg (172 lb 6.4 oz)     SpO2: 94% 93% 99% 88%   Weight change: 0.1 kg (3.5 oz)  Intake/Output Summary (Last 24 hours) at 09/09/13 1313 Last data filed at 09/09/13 1000  Gross per 24 hour  Intake    125 ml  Output   1150 ml  Net  -1025 ml   Physical Exam: BP 110/61  Pulse 81  Temp(Src) 98.4 F (36.9 C) (Oral)  Resp 22  Ht _0  (1.778 m)  Wt 78.2 kg (172 lb 6.4 oz)  BMI 24.74 kg/m2  SpO2 88% General appearance: alert, cooperative and no distress, seem to be just waking up at 10am Lungs: clear to auscultation bilaterally Heart: irregular rate, predominately one heart sound, no murmurs Abdomen: soft, non-tender; bowel sounds normal; no masses,  no organomegaly Extremities: extremities normal, atraumatic, no cyanosis or edema Neuro: oriented to person, said the year was 1915, but knew that it was February and that he said that he was in  [place], but he could answer questions and follow directions well.  Lab Results: BMET    Component Value Date/Time   NA 142 09/09/2013 0605   K 3.8 09/09/2013 0605   CL 100 09/09/2013 0605   CO2 31 09/09/2013 0605   GLUCOSE 94 09/09/2013 0605   BUN 75* 09/09/2013 0605   CREATININE 1.42* 09/09/2013 0605   CALCIUM 11.5* 09/09/2013 0605   GFRNONAA 48* 09/09/2013 0605   GFRAA 56* 09/09/2013 0605    CBC    Component Value Date/Time   WBC 13.5* 09/07/2013 0237   RBC 3.36* 09/07/2013 0237   HGB 10.4* 09/07/2013 0237   HCT 30.3* 09/07/2013 0237   PLT 225 09/07/2013 0237   MCV 90.2 09/07/2013 0237   MCH 31.0 09/07/2013 0237   MCHC 34.3 09/07/2013 0237   RDW 14.8 09/07/2013 0237   LYMPHSABS 1.8 09/02/2013 1409   MONOABS 1.8* 09/02/2013 1409   EOSABS 0.2 09/02/2013 1409   BASOSABS 0.0 09/02/2013 1409     Micro Results: Recent Results (from the past 240 hour(s))  CULTURE, BLOOD (ROUTINE X 2)     Status: None   Collection Time    09/02/13  9:45 PM      Result Value Ref Range Status   Specimen Description BLOOD RIGHT ARM  Final   Special Requests BOTTLES DRAWN AEROBIC AND ANAEROBIC 10CC EACH   Final   Culture  Setup Time     Final   Value: 09/03/2013 00:33     Performed at [facility]   Culture     Final   Value: NO GROWTH 5 DAYS     Performed at [facility]   Report Status 09/09/2013 FINAL   Final  CULTURE, BLOOD (ROUTINE X 2)     Status: None   Collection Time    09/02/13  9:55 PM      Result Value Ref Range Status   Specimen Description BLOOD RIGHT HAND   Final   Special Requests BOTTLES DRAWN AEROBIC ONLY 10CC   Final   Culture  Setup Time     Final   Value: 09/03/2013 00:33     Performed at [facility]   Culture     Final   Value: NO GROWTH 5 DAYS     Performed at [facility]   Report Status 09/09/2013 FINAL   Final  RESPIRATORY VIRUS PANEL     Status: None   Collection Time    09/02/13  9:55 PM      Result Value Ref Range Status   Source -  RVPAN NASAL SWAB   Corrected   Comment: CORRECTED ON 02/20 AT 0646: PREVIOUSLY REPORTED AS NASAL SWAB   Respiratory Syncytial Virus A NOT DETECTED   Final   Respiratory Syncytial Virus B NOT DETECTED   Final   Influenza A NOT DETECTED   Final   Influenza B NOT DETECTED   Final   Parainfluenza 1 NOT DETECTED   Final   Parainfluenza 2 NOT DETECTED   Final   Parainfluenza 3 NOT DETECTED   Final   Metapneumovirus NOT DETECTED   Final   Rhinovirus NOT DETECTED   Final   Adenovirus NOT DETECTED   Final   Influenza A H1 NOT DETECTED   Final   Influenza A H3 NOT DETECTED   Final   Comment: (NOTE)           Normal Reference Range for each Analyte: NOT DETECTED     Testing performed using the Luminex xTAG Respiratory Viral Panel test     kit.     This test was developed and its performance characteristics determined     by [facility]. It has not been cleared or approved by the [place]     Food and Drug Administration. This test is used for clinical purposes.     It should not be regarded as investigational or for research. This     laboratory is certified under the [place] (CLIA) as qualified to perform high complexity     clinical laboratory testing.     Performed at [facility] DIFFICILE BY PCR     Status: None   Collection Time    09/03/13  5:47 PM      Result Value Ref Range Status   C difficile by pcr NEGATIVE  NEGATIVE Final  MRSA PCR SCREENING     Status: None   Collection Time    09/03/13 10:34 PM      Result Value Ref Range Status   MRSA by PCR NEGATIVE  NEGATIVE Final   Comment:            The GeneXpert MRSA Assay (FDA  approved for NASAL specimens     only), is one component of a     comprehensive MRSA colonization     surveillance program. It is not     intended to diagnose MRSA     infection nor to guide or     monitor treatment for     MRSA infections.   Studies/Results: No results  found. Medications: I have reviewed the patient's current medications. Scheduled Meds: . amiodarone  200 mg Oral Daily  . antiseptic oral rinse  15 mL Mouth Rinse q12n4p  . aspirin EC  81 mg Oral Daily  . benzonatate  200 mg Oral TID  . chlorhexidine  15 mL Mouth Rinse BID  . ferrous [id number] C-folic acid  1 capsule Oral Q lunch  . guaiFENesin  600 mg Oral BID  . loratadine  10 mg Oral Daily  . magnesium oxide  1,000 mg Oral BID  . pantoprazole  40 mg Oral Daily  . pyridOXINE  100 mg Oral Q lunch  . sildenafil  20 mg Oral TID  . sodium chloride  3 mL Intravenous Q12H  . warfarin  1 each Does not apply Once  . Warfarin - Pharmacist Dosing Inpatient   Does not apply q1800   Continuous Infusions: . milrinone 0.25 mcg/kg/min (09/09/13 0456)   PRN Meds:.albuterol, alum & mag hydroxide-simeth, promethazine Assessment/Plan: Principal Problem:   Acute on chronic systolic heart failure Active Problems:   Atrial fibrillation   Chronic systolic heart failure   Chronic cough   Sleep apnea   Pneumonia   Acute respiratory failure   Acute pulmonary edema   LOS: 7 days   [name] is a 73 yo AA man with history of severe CHF (EF 10-15%) with ICD and IV milrinone and not a candidate for heart transplant, Afib on warfarin, OSA, allergic rhinitis, and GERD who presents with acute CHF with possible underlying CAP. His hospital course is complicated with need to be on BiPAP, and required aggressive diuresis. However, patient is improving clinically and has transitioned to oxygen from nasal canula.   # Acute CHF: Improving. Patient has basline NYHA Class IV Systolic CHF with non-ischemic CM treated with IV milrinon and digoxin at home, and he has ICD placed. He is not a transplant candidate. Patient is now 172lb, down from 185lb at [place]. CXR on 09/07/2013 shows improving pulmonary edema. Stopped lasix on 2/23 due to worsening Cr to 2.25 and CVP is at 5. Cr is currently at 1.42,  which is improving. Currently on [place] oxygen with good saturation. Plan: -Continue to hold lasix until 2/26,  -Hold digoxin due to renal function.  -HF recommends short-stay in [place] HF team recs  -Continuous milrinone per pharmacy  -Sidenalfil 58m TID  -Aspirin 864mqD  -Hold ARB/ACEi due to AKI  -Daily weights   # Acute on chronic cough likely caused by CAP: Improving. Patient presented with severe cough with new infiltrates on admission CXR showing pulm edema vs. PNA. Blood culture has not shown any growth to date. Patient is on day 7 of antibiotics. Patient is currently on 5L [place] and maintaining good sats.  Antibiotics:  Azithro 2/19 >>> 2/20  Ceftriaxone 2/19 >>> 2/20  Cefepime 2/20 >>> 2/26 (planned)   Plan:  -Continue cefepime until 2/26 for a total of 7 day antibiotics treatment for HCAP vs CAP -Incentive spirometry  -Albuterol Nebs q6H  -Mucinex 60057mID and tessalon 200 TID for cough  -Loratadine 46m43mr allergies  -  Protonix 39m qD  -Promethazine 6.252minjections q6H PRN  -Outpaitent PFT (09/14/13) to assess for obstructive/restrictive disease  -CBC in AM   # Hypercalcemia: Ca at 11.5, diarrhea, but oriented to person. He mis-spoke year to be 1915, but knew that it is February, and that he is in the hospital.  -Monitor mental status and Ca level -Diuretics are considered tomorrow, so wait until then -No IVF given patient's CHF  # Non-bloody watery diarrhea: Given similar symptoms on presentation, it is unlikely to be infectious, but will monitor and obtain C diff toxins as necessary. May be caused by hypercalcemia, but patient does not appear very symptomatic. Will clinically monitor and may consider repeat testing for c.diff if diarrhea continues as he has been exposed to abx over the past several days.  # Atrial Fibrillation: Currently on amiodarone 20048maily and warfarin therapy. INR is still elevated at 4.29, but no signs of bleeding.  -Monitor for  bleeding, check CBC  -Warfarin per pharmacy  -Amiodarone 200m63mily  -Check INR for therapy response   # AKI on CKD stage 2: current Cr is 1.47 with baseline at 1.3. Likely due to CHF or diuresis as renal function is improving as CHF is improving and when diuretics are held.  -Avoid nephrotoxins   # Normocytic anemia: Hgb stable at 11.1 from 12.2. Likely hemodilution from acute CHF. Pt with history of since 1 month ago. Etiology most likely due to CKD.  -Consider anemia panel  -Monitor for bleeding  -Monitor CBC   # Obstructive [facility] Ap[facility]: [facility]   # Allergic Rhinitis - Pt reports symptoms controlled on home anti-histamine therapy (home cetirizine 10 mg daily).  -Loratadine 10 mg daily (forumalary)  -Consider nasal corticosteroid, 1st line for AR   # GERD - currently without reflux symptoms. Pt on PPI (protonix) therapy at home.  -Protonix 40 mg daily   # Constipation - Currently without symptoms  -Continue home senna 8.6 mg daily  -Magnesium oxide daily  -Maalox 30mL5m PRN   # Diet: Heart healthy  # DVT Ppx: Coumadin per pharmacy, SCD's  # Code: DNR, DNI, POA is wife  Dispo: pending depending on   This is a [employer] information officer.  The care of the patient was discussed with Dr. ComerLinus Salmonsthe assessment and plan formulated with their assistance.  Please see their attached note for official documentation of the daily encounter.  JiangKonrad Saha/2015, 1:13 PM   I have seen the patient and reviewed the daily progress note by Rui MElvin SoV and discussed the care of the patient with them.  I agree with her note as documented above. Signed:  RichaJessee AversPGY-2 Internal Medicine Teaching Service Pager: [phone]

## 2013-09-09 NOTE — Progress Notes (Signed)
ANTICOAGULATION/ ANTIBIOTIC CONSULT NOTE - Follow Up Consult  Pharmacy Consult for warfarin/vancomycin Indication: atrial fibrillation, empiric PNA  No Known Allergies  Patient Measurements: Height: 5\' 10"  (177.8 cm) Weight: 172 lb 6.4 oz (78.2 kg) IBW/kg (Calculated) : 73   Vital Signs: Temp: 98.4 F (36.9 C) (02/25 0330) Temp src: Axillary (02/25 0330) BP: 111/55 mmHg (02/25 0700) Pulse Rate: 69 (02/25 0700)  Labs:  Recent Labs  09/07/13 0237 09/08/13 0216 09/09/13 0605  HGB 10.4*  --   --   HCT 30.3*  --   --   PLT 225  --   --   LABPROT 47.0* 49.0* 39.5*  INR 5.85* 5.57* 4.29*  CREATININE 2.27* 1.81* 1.42*    Estimated Creatinine Clearance: 49.3 ml/min (by C-G formula based on Cr of 1.42).   Medications:  Scheduled:  . amiodarone  200 mg Oral Daily  . antiseptic oral rinse  15 mL Mouth Rinse q12n4p  . aspirin EC  81 mg Oral Daily  . benzonatate  200 mg Oral TID  . chlorhexidine  15 mL Mouth Rinse BID  . ferrous fumarate-b12-vitamic C-folic acid  1 capsule Oral Q lunch  . guaiFENesin  600 mg Oral BID  . loratadine  10 mg Oral Daily  . magnesium oxide  1,000 mg Oral BID  . pantoprazole  40 mg Oral Daily  . pyridOXINE  100 mg Oral Q lunch  . sildenafil  20 mg Oral TID  . sodium chloride  3 mL Intravenous Q12H  . warfarin  1 each Does not apply Once  . Warfarin - Pharmacist Dosing Inpatient   Does not apply q1800    Assessment: 45 YOM on warfarin PTA for AFib admitted 09/02/2013.   Pharmacy consulted to dose warfarin & cefepime   PMH: CHF - EF 10-15%, ICD, chronic outpt milrinone, GERD, Anemia, CKD Stage III, afib on coumadin, OSA on CPAP, chronic 3 mos cough, past tobacco  AC: Warfarin PTA for afib. INR therapeutic on admit. Restarted on home dose: 5 mg daily x 7.5 mg Tues, Fri; INR started to rise 2/22; INR remains elevated no warfarin x4d. Hgb slight trend down and plts have remained stable, no bleeding noted. Patient is on amiodarone, however this is a  home medication which should not be acutely affecting INR.  ID:ID: HCAP . Afebrile/24h, WBC trend down PCT 8.12. HIV, Hepatitis panels neg  Vanc 2/18>>2/19; restart 2/20>>, trough 26.5 (est t1/2~23h)2/24 Zosyn 2/18>>2/19 Cefepime 2/20>> Roceph 2/19>>2/20 Azith 2/19>>2/20  2/18 Bld: ngtd 2/19 Cdiff PCR neg RSV neg MRSA PCR neg  CV: HF ::  milrinone 0.25 mcg/kg/mi, Amio, Digoxin (2/18 Dig=1, dc 2/23 with rising scr), Revatio. , asa,  COox 75  110s/50s, HR 60s  Endo: No issues  GI/Nutr: T Bili 2.2 - Phenergan changed to 6.25mg . Other LFTs wnl. Full liquid  Neuro: A&O  Renal: Scr trend better, CrCl 49 Pulm: OSA, CPAP. Now on bipap  Best practices: Coumadin, PPI  Plan: - hold tonight's warfarin - Daily INR - Continue Cefepime 1gm IV q24h  - F/u renal function, micro data, clinical condition  Thank you for allowing pharmacy to be a part of this patients care team.  Lovenia Kim Pharm.D., BCPS Clinical Pharmacist 09/09/2013 8:19 AM Pager: [phone] Phone: [phone]

## 2013-09-09 NOTE — Progress Notes (Signed)
Patient ID: Gregg [name], male   DOB: 1940-07-30, 73 y.o.   MRN: [id number]   SUBJECTIVE: Gregg [name] is a 73 y.o. 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 On 04/02/12 underwent neurocognitive testing.and is not a candidate for advanced therapies due to results for neurocognitive results. Maintained on home milrinone.   ECHO 09/2012: EF 10-15% RV moderately HK moderate MR   Admitted with respiratory failure thought primarily due to PNA with component of HF. Moved to MICU due to respiratory distress. Required short term Bipap. He has been off diuretics for several days due to elevated creatinine.  Yesterday he transferred to stepdown. Ambulated with PT with desaturations noted on 6 liters [place]. Required 15 liters to maintain sats 90-97%.  SOB with ambulation.   Creatinine 1.6>2.15>2.27>1.8>1.42  INR 5.57> 4.29  Blood Cultures NGTD C-diff negative   CVP  Co-ox 75%.  Scheduled Meds: . amiodarone  200 mg Oral Daily  . antiseptic oral rinse  15 mL Mouth Rinse q12n4p  . aspirin EC  81 mg Oral Daily  . benzonatate  200 mg Oral TID  . chlorhexidine  15 mL Mouth Rinse BID  . ferrous fumarate-b12-vitamic C-folic acid  1 capsule Oral Q lunch  . guaiFENesin  600 mg Oral BID  . loratadine  10 mg Oral Daily  . magnesium oxide  1,000 mg Oral BID  . pantoprazole  40 mg Oral Daily  . pyridOXINE  100 mg Oral Q lunch  . sildenafil  20 mg Oral TID  . sodium chloride  3 mL Intravenous Q12H  . warfarin  1 each Does not apply Once  . Warfarin - Pharmacist Dosing Inpatient   Does not apply q1800   Continuous Infusions: . milrinone 0.25 mcg/kg/min (09/09/13 0456)   PRN Meds:.albuterol, alum & mag hydroxide-simeth, promethazine    Filed Vitals:    09/08/13 1924 09/08/13 2334 09/09/13 0330 09/09/13 0700  BP: 104/52 107/50 107/50 111/55  Pulse: 76 76 77 69  Temp: 98.8 F (37.1 C) 98.5 F (36.9 C) 98.4 F (36.9 C)   TempSrc: Oral Axillary Axillary   Resp: 24 28 17 27   Height:      Weight:   172 lb 6.4 oz (78.2 kg)   SpO2: 94% 92% 94% 93%    Intake/Output Summary (Last 24 hours) at 09/09/13 0819 Last data filed at 09/09/13 0700  Gross per 24 hour  Intake  137.2 ml  Output    825 ml  Net -687.8 ml    LABS: Basic Metabolic Panel:  Recent Labs  28/41/32 1003  09/08/13 0216 09/09/13 0605  NA  --   < > 137 142  K  --   < > 3.9 3.8  CL  --   < > 98 100  CO2  --   < > 27 31  GLUCOSE  --   < > 101* 94  BUN  --   < > 88* 75*  CREATININE  --   < > 1.81* 1.42*  CALCIUM  --   < > 11.4* 11.5*  MG 2.7*  --   --   --   PHOS 3.6  --   --   --   < > = values in this interval not displayed. Liver Function  Tests: No results found for this basename: AST, ALT, ALKPHOS, BILITOT, PROT, ALBUMIN,  in the last 72 hours No results found for this basename: LIPASE, AMYLASE,  in the last 72 hours CBC:  Recent Labs  09/07/13 0237  WBC 13.5*  HGB 10.4*  HCT 30.3*  MCV 90.2  PLT 225   Cardiac Enzymes: No results found for this basename: CKTOTAL, CKMB, CKMBINDEX, TROPONINI,  in the last 72 hours BNP: No components found with this basename: POCBNP,  D-Dimer: No results found for this basename: DDIMER,  in the last 72 hours Hemoglobin A1C: No results found for this basename: HGBA1C,  in the last 72 hours Fasting Lipid Panel: No results found for this basename: CHOL, HDL, LDLCALC, TRIG, CHOLHDL, LDLDIRECT,  in the last 72 hours Thyroid Function Tests: No results found for this basename: TSH, T4TOTAL, FREET3, T3FREE, THYROIDAB,  in the last 72 hours Anemia Panel: No results found for this basename: VITAMINB12, FOLATE, FERRITIN, TIBC, IRON, RETICCTPCT,  in the last 72 hours  RADIOLOGY: Dg Chest 2 View  09/02/2013   CLINICAL DATA:   Cough and emesis.  Joint pain  EXAM: CHEST  2 VIEW  COMPARISON:  07/21/2013  FINDINGS: Stable appearance of dual-chamber left approach ICD/pacer. Right IJ central venous catheter is in stable position. Cardiomegaly which is also stable. New perihilar airspace disease, most dense on the right. No effusion or interstitial coarsening. No pneumothorax. No acute osseous findings.  IMPRESSION: Perihilar airspace disease is concerning for pneumonia given the focal appearance. Pulmonary edema can also have this appearance.   Electronically Signed   By: Tiburcio PeaJonathan  Watts M.D.   On: 09/02/2013 14:13   Dg Chest Port 1 View  09/05/2013   CLINICAL DATA:  Follow-up pulmonary edema.  EXAM: PORTABLE CHEST - 1 VIEW  COMPARISON:  DG CHEST 1V PORT dated 09/04/2013; DG CHEST 2 VIEW dated 09/02/2013; DG CHEST 2 VIEW dated 07/21/2013  FINDINGS: Interval improvement in the diffuse interstitial and airspace pulmonary edema since the examination yesterday, though moderate to severe airspace opacities persist throughout both lungs. No new pulmonary parenchymal abnormality. Right jugular tunneled central venous catheter tip projects over the lower SVC at or near the cavoatrial junction, unchanged. Left subclavian pacing defibrillator unchanged.  IMPRESSION: Improvement in the CHF since yesterday, though moderate to severe interstitial and airspace pulmonary edema persists. No new abnormalities.   Electronically Signed   By: Hulan Saashomas  Lawrence M.D.   On: 09/05/2013 16:54   Dg Chest Port 1 View  09/04/2013   CLINICAL DATA:  Evaluate pulmonary edema ; leukocytosis, fever  EXAM: PORTABLE CHEST - 1 VIEW  COMPARISON:  Portable exam 0907 hr compared to 09/02/2013  FINDINGS: Right jugular central venous catheter with tip projecting over SVC.  Left subclavian AICD leads project over right atrium and right ventricle.  Enlargement of cardiac silhouette with pulmonary vascular congestion.  Diffuse bilateral airspace infiltrates significantly increased  since previous exam could represent pulmonary edema or pneumonia.  No gross pleural effusion or pneumothorax.  IMPRESSION: Enlargement of cardiac silhouette with pulmonary vascular congestion post AICD.  Significantly increased bilateral airspace infiltrates which could represent pulmonary edema or pneumonia.   Electronically Signed   By: Ulyses SouthwardMark  Boles M.D.   On: 09/04/2013 09:21    PHYSICAL EXAM  General: NAD Lying in bed.  Neck: JVD 6-7, no thyromegaly or thyroid nodule.  Lungs: RLL and LLL crackles on 6 liters [place] CV: Nondisplaced PMI.  Heart irregular S1/S2, no S3/S4, no murmur.  No peripheral edema.  No carotid bruit.  Normal pedal pulses.  Abdomen: Soft, nontender, no hepatosplenomegaly, no distention.  Neurologic: Alert and oriented x 3.  Psych: Normal affect. Extremities: No clubbing or cyanosis.   TELEMETRY: Vpacing HR 70s underlying rhythm unclear  Assessment:   1) A/C systolic HF  - EF 10-15% (09/2012)  - on home milrinone 0.25  2) A/C respiratory failure  3) CAP ?  4) Afib s/p DC-CV 3/14 to NSR.   - on Coumadin  5) Acute on chronic renal failure 6) DNR   Plan/Discussion:    Slowly improving but weak.   CO-OX stable. Volume status stable. Weight unchanged. Continue to hold diuretics.  Continue current dose of Milrinone at 0.25 mcg which is his home dose.   Amio toxicity a consideration but CXR clearing so less likely.   INR trending down. Pharmacy following.   Appreciate CCM care. Consider LTACH.   CLEGG,AMY, NP-C  8:19 AM  Patient seen and examined with Tonye Becket, NP. We discussed all aspects of the encounter. I agree with the assessment and plan as stated above.   Improving slowly but still with significant O2 requirement and deconditioning. I do think short-term stay at [facility] may be appropriate if he qualifies. Continue milrinone. Renal function improving but weight still down. Would hold diuretics one more day and likely restart home dose tomorrow.   Dayanira Giovannetti,MD 8:50 AM

## 2013-09-09 NOTE — Progress Notes (Signed)
  Date: 09/09/2013  Patient name: Gregg [name]  Medical record number: [id number]  Date of birth: [birth year]   This patient has been seen and the plan of care was discussed with the house staff. Please see their note for complete details. I concur with their findings with the following additions/corrections:  Improving and will complete course of cefepime for HCAP.  May need LTAC.    Gardiner Barefoot, MD 09/09/2013, 10:56 AM

## 2013-09-10 LAB — PROTIME-INR
INR: 3.28 — ABNORMAL HIGH (ref 0.00–1.49)
PROTHROMBIN TIME: 32.2 s — AB (ref 11.6–15.2)

## 2013-09-10 MED ORDER — FUROSEMIDE 40 MG PO TABS
40.0000 mg | ORAL_TABLET | Freq: Two times a day (BID) | ORAL | Status: DC
  Administered 2013-09-10 – 2013-09-14 (×10): 40 mg via ORAL
  Filled 2013-09-10 (×11): qty 1

## 2013-09-10 NOTE — Progress Notes (Signed)
  Date: 09/10/2013  Patient name: Gregg [name]  Medical record number: [id number]  Date of birth: 1940/08/26   This patient has been seen and the plan of care was discussed with the house staff. Please see their note for complete details. I concur with their findings with the following additions/corrections:  His cefepime has been off since 2/24 and continues to do ok, so no indication to restart it.  He will need LTAC likely to continue to recover including due to his deconditioning.  Dr. Gala Romney encouraging incentive spirometry.  Restarting lasix.  Creat noted and better.    Gardiner Barefoot, MD 09/10/2013, 9:41 AM

## 2013-09-10 NOTE — Progress Notes (Signed)
ANTICOAGULATION CONSULT NOTE - Follow Up Consult  Pharmacy Consult for warfarin Indication: atrial fibrillation  No Known Allergies  Patient Measurements: Height: 5\' 10"  (177.8 cm) Weight: 172 lb 6.4 oz (78.2 kg) IBW/kg (Calculated) : 73   Vital Signs: Temp: 98.5 F (36.9 C) (02/26 0731) Temp src: Oral (02/26 0731) BP: 119/57 mmHg (02/26 0731) Pulse Rate: 70 (02/26 0731)  Labs:  Recent Labs  09/08/13 0216 09/09/13 0605 09/10/13 0410  LABPROT 49.0* 39.5* 32.2*  INR 5.57* 4.29* 3.28*  CREATININE 1.81* 1.42*  --     Estimated Creatinine Clearance: 49.3 ml/min (by C-G formula based on Cr of 1.42).   Medications:  Scheduled:  . amiodarone  200 mg Oral Daily  . antiseptic oral rinse  15 mL Mouth Rinse q12n4p  . aspirin EC  81 mg Oral Daily  . benzonatate  200 mg Oral TID  . chlorhexidine  15 mL Mouth Rinse BID  . ferrous fumarate-b12-vitamic C-folic acid  1 capsule Oral Q lunch  . guaiFENesin  600 mg Oral BID  . loratadine  10 mg Oral Daily  . magnesium oxide  1,000 mg Oral BID  . pantoprazole  40 mg Oral Daily  . pyridOXINE  100 mg Oral Q lunch  . sildenafil  20 mg Oral TID  . sodium chloride  3 mL Intravenous Q12H  . warfarin  1 each Does not apply Once  . Warfarin - Pharmacist Dosing Inpatient   Does not apply q1800    Assessment: 33 YOM on warfarin PTA for AFib admitted 09/02/2013.   Pharmacy consulted to dose warfarin & cefepime   PMH: CHF - EF 10-15%, ICD, chronic outpt milrinone, GERD, Anemia, CKD Stage III, afib on coumadin, OSA on CPAP, chronic 3 mos cough, past tobacco  AC: Warfarin PTA for afib. INR therapeutic on admit. Restarted on home dose: 5 mg daily x 7.5 mg Tues, Fri; INR started to rise 2/22; INR remains elevated no warfarin x5d. Hgb slight trend down and plts have remained stable, no bleeding noted. Patient is on amiodarone, however this is a home medication which should not be acutely affecting INR.  Goal of therapy: INR 2-3  Plan: - Hold  warfarin tonight. - F/u AM INR.  Tad Moore, BCPS  Clinical Pharmacist Pager [phone]  09/10/2013 8:26 AM

## 2013-09-10 NOTE — Progress Notes (Signed)
Patient ID: Gregg [name], male   DOB: 07-06-1941, 73 y.o.   MRN: [id number]   SUBJECTIVE: Gregg [name] is a 73 y.o. 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 On 04/02/12 underwent neurocognitive testing.and is not a candidate for advanced therapies due to results for neurocognitive results. Maintained on home milrinone.   ECHO 09/2012: EF 10-15% RV moderately HK moderate MR   Admitted with respiratory failure thought primarily due to PNA with component of HF. Improved but continues to desat at rest and is very weak. Severe nonproductive cough. Denies dyspnea or feverrs  Creatinine 1.6>2.15>2.27>1.8>1.42  INR 5.57> 4.29 > 3.2   CVP  Co-ox 75%.  Scheduled Meds: . amiodarone  200 mg Oral Daily  . antiseptic oral rinse  15 mL Mouth Rinse q12n4p  . aspirin EC  81 mg Oral Daily  . benzonatate  200 mg Oral TID  . chlorhexidine  15 mL Mouth Rinse BID  . ferrous fumarate-b12-vitamic C-folic acid  1 capsule Oral Q lunch  . guaiFENesin  600 mg Oral BID  . loratadine  10 mg Oral Daily  . magnesium oxide  1,000 mg Oral BID  . pantoprazole  40 mg Oral Daily  . pyridOXINE  100 mg Oral Q lunch  . sildenafil  20 mg Oral TID  . sodium chloride  3 mL Intravenous Q12H  . warfarin  1 each Does not apply Once  . Warfarin - Pharmacist Dosing Inpatient   Does not apply q1800   Continuous Infusions: . milrinone 0.25 mcg/kg/min (09/09/13 2312)   PRN Meds:.albuterol, alum & mag hydroxide-simeth, promethazine    Filed Vitals:   09/09/13 1931 09/09/13 2300 09/10/13 0400 09/10/13 0731  BP: 115/61 109/59 107/64 119/57  Pulse: 69 72 69 70  Temp: 98.4 F (36.9 C) 98.3 F (36.8 C) 98.3 F (36.8 C) 98.5 F (36.9 C)  TempSrc: Axillary Axillary  Axillary Oral  Resp: 19 24 25 21   Height:      Weight:   78.2 kg (172 lb 6.4 oz)   SpO2: 96% 100% 100% 96%    Intake/Output Summary (Last 24 hours) at 09/10/13 0947 Last data filed at 09/10/13 0800  Gross per 24 hour  Intake  470.3 ml  Output   1590 ml  Net -1119.7 ml    LABS: Basic Metabolic Panel:  Recent Labs  69/45/03 0216 09/09/13 0605  NA 137 142  K 3.9 3.8  CL 98 100  CO2 27 31  GLUCOSE 101* 94  BUN 88* 75*  CREATININE 1.81* 1.42*  CALCIUM 11.4* 11.5*   Liver Function Tests: No results found for this basename: AST, ALT, ALKPHOS, BILITOT, PROT, ALBUMIN,  in the last 72 hours No results found for this basename: LIPASE, AMYLASE,  in the last 72 hours CBC: No results found for this basename: WBC, NEUTROABS, HGB, HCT, MCV, PLT,  in the last 72 hours Cardiac Enzymes: No results found for this basename: CKTOTAL, CKMB, CKMBINDEX, TROPONINI,  in the last 72 hours BNP: No components found with this basename: POCBNP,  D-Dimer: No results found for this basename: DDIMER,  in the last 72 hours Hemoglobin A1C: No results found for this basename: HGBA1C,  in the last 72 hours Fasting Lipid Panel: No results found for this basename: CHOL,  HDL, LDLCALC, TRIG, CHOLHDL, LDLDIRECT,  in the last 72 hours Thyroid Function Tests: No results found for this basename: TSH, T4TOTAL, FREET3, T3FREE, THYROIDAB,  in the last 72 hours Anemia Panel: No results found for this basename: VITAMINB12, FOLATE, FERRITIN, TIBC, IRON, RETICCTPCT,  in the last 72 hours  RADIOLOGY: Dg Chest 2 View  09/02/2013   CLINICAL DATA:  Cough and emesis.  Joint pain  EXAM: CHEST  2 VIEW  COMPARISON:  07/21/2013  FINDINGS: Stable appearance of dual-chamber left approach ICD/pacer. Right IJ central venous catheter is in stable position. Cardiomegaly which is also stable. New perihilar airspace disease, most dense on the right. No effusion or interstitial coarsening. No pneumothorax. No acute osseous findings.   IMPRESSION: Perihilar airspace disease is concerning for pneumonia given the focal appearance. Pulmonary edema can also have this appearance.   Electronically Signed   By: Tiburcio PeaJonathan  Watts M.D.   On: 09/02/2013 14:13   Dg Chest Port 1 View  09/05/2013   CLINICAL DATA:  Follow-up pulmonary edema.  EXAM: PORTABLE CHEST - 1 VIEW  COMPARISON:  DG CHEST 1V PORT dated 09/04/2013; DG CHEST 2 VIEW dated 09/02/2013; DG CHEST 2 VIEW dated 07/21/2013  FINDINGS: Interval improvement in the diffuse interstitial and airspace pulmonary edema since the examination yesterday, though moderate to severe airspace opacities persist throughout both lungs. No new pulmonary parenchymal abnormality. Right jugular tunneled central venous catheter tip projects over the lower SVC at or near the cavoatrial junction, unchanged. Left subclavian pacing defibrillator unchanged.  IMPRESSION: Improvement in the CHF since yesterday, though moderate to severe interstitial and airspace pulmonary edema persists. No new abnormalities.   Electronically Signed   By: Hulan Saashomas  Lawrence M.D.   On: 09/05/2013 16:54   Dg Chest Port 1 View  09/04/2013   CLINICAL DATA:  Evaluate pulmonary edema ; leukocytosis, fever  EXAM: PORTABLE CHEST - 1 VIEW  COMPARISON:  Portable exam 0907 hr compared to 09/02/2013  FINDINGS: Right jugular central venous catheter with tip projecting over SVC.  Left subclavian AICD leads project over right atrium and right ventricle.  Enlargement of cardiac silhouette with pulmonary vascular congestion.  Diffuse bilateral airspace infiltrates significantly increased since previous exam could represent pulmonary edema or pneumonia.  No gross pleural effusion or pneumothorax.  IMPRESSION: Enlargement of cardiac silhouette with pulmonary vascular congestion post AICD.  Significantly increased bilateral airspace infiltrates which could represent pulmonary edema or pneumonia.   Electronically Signed   By: Ulyses SouthwardMark  Boles M.D.   On: 09/04/2013 09:21     PHYSICAL EXAM  General: NAD Lying in bed. + cough Neck: JVD 6-7, no thyromegaly or thyroid nodule.  Lungs: significant bibasilar crackles crackles on 6 liters [place] CV: Nondisplaced PMI.  Heart irregular S1/S2, no S3/S4, no murmur.  No peripheral edema.  No carotid bruit.  Normal pedal pulses.  Abdomen: Soft, nontender, no hepatosplenomegaly, no distention.  Neurologic: Alert and oriented x 3.  Psych: Normal affect. Extremities: No clubbing or cyanosis.   TELEMETRY: Vpacing HR 70s underlying rhythm AF  Assessment:   1) A/C systolic HF  - EF 10-15% (09/2012)  - on home milrinone 0.25  2) A/C respiratory failure  3) CAP ?  4) Afib s/p DC-CV 3/14 to NSR.   - on Coumadin  5) Acute on chronic renal failure 6) DNR   Plan/Discussion:    Slowly improving but weak and continues to desat.  We provided him with an incentive spriometer. Will resume home lasix  Continue current dose of  Milrinone at 0.25 mcg which is his home dose.   INR trending down. Pharmacy following.   Given persistent weakness and desaturations would recommend [facility] so he can rehab and we can follow. At baseline his functional status is pretty good despite need for milrinone so hopefully we can get him back to that.  D/w Dr. Luciana Axe.  Arvilla Meres, MD  9:47 AM

## 2013-09-10 NOTE — Progress Notes (Signed)
Medical Student Daily Progress Note  Subjective: Diarrhea continues overnight with 4 episodes in 12 hours. No nausea or vomiting. No worsening SOB.   Objective: Vital signs in last 24 hours: Filed Vitals:   09/09/13 1931 09/09/13 2300 09/10/13 0400 09/10/13 0731  BP: 115/61 109/59 107/64 119/57  Pulse: 69 72 69 70  Temp: 98.4 F (36.9 C) 98.3 F (36.8 C) 98.3 F (36.8 C) 98.5 F (36.9 C)  TempSrc: Axillary Axillary Axillary Oral  Resp: 19 24 25 21   Height:      Weight:   78.2 kg (172 lb 6.4 oz)   SpO2: 96% 100% 100% 96%   Weight change: 0 kg (0 lb)  Intake/Output Summary (Last 24 hours) at 09/10/13 0926 Last data filed at 09/10/13 0800  Gross per 24 hour  Intake  590.3 ml  Output   1590 ml  Net -999.7 ml   Physical Exam: BP 119/57  Pulse 70  Temp(Src) 98.5 F (36.9 C) (Oral)  Resp 21  Ht 5' 10"  (1.778 m)  Wt 78.2 kg (172 lb 6.4 oz)  BMI 24.74 kg/m2  SpO2 96% on 6L [employer], patient desats to 84% when off of oxygen.  General appearance: cooperative, no distress and sleeping when we intially entered the room, remained sleepy Lungs: clear to auscultation bilaterally Heart: regular rate, only 1 prominent heart sound heard, no murmurs Abdomen: soft, non-tender; bowel sounds normal; no masses,  no organomegaly, patient noted some nausea when we pressed on his RUQ Extremities: extremities normal, atraumatic, no cyanosis or edema Pulses: 2+ and symmetric Lab Results: Lab Results  Component Value Date   INR 3.28* 09/10/2013   INR 4.29* 09/09/2013   INR 5.57* 09/08/2013    BMET    Component Value Date/Time   NA 142 09/09/2013 0605   K 3.8 09/09/2013 0605   CL 100 09/09/2013 0605   CO2 31 09/09/2013 0605   GLUCOSE 94 09/09/2013 0605   BUN 75* 09/09/2013 0605   CREATININE 1.42* 09/09/2013 0605   CALCIUM 11.5* 09/09/2013 0605   GFRNONAA 48* 09/09/2013 0605   GFRAA 56* 09/09/2013 0605    CBC    Component Value Date/Time   WBC 13.5* 09/07/2013 0237   RBC 3.36* 09/07/2013 0237   HGB 10.4* 09/07/2013 0237   HCT 30.3* 09/07/2013 0237   PLT 225 09/07/2013 0237   MCV 90.2 09/07/2013 0237   MCH 31.0 09/07/2013 0237   MCHC 34.3 09/07/2013 0237   RDW 14.8 09/07/2013 0237   LYMPHSABS 1.8 09/02/2013 1409   MONOABS 1.8* 09/02/2013 1409   EOSABS 0.2 09/02/2013 1409   BASOSABS 0.0 09/02/2013 1409     Micro Results: Recent Results (from the past 240 hour(s))  CULTURE, BLOOD (ROUTINE X 2)     Status: None   Collection Time    09/02/13  9:45 PM      Result Value Ref Range Status   Specimen Description BLOOD RIGHT ARM   Final   Special Requests BOTTLES DRAWN AEROBIC AND ANAEROBIC 10CC EACH   Final   Culture  Setup Time     Final   Value: 09/03/2013 00:33     Performed at [facility]   Culture     Final   Value: NO GROWTH 5 DAYS     Performed at [facility]   Report Status 09/09/2013 FINAL   Final  CULTURE, BLOOD (ROUTINE X 2)     Status: None   Collection Time    09/02/13  9:55 PM  Result Value Ref Range Status   Specimen Description BLOOD RIGHT HAND   Final   Special Requests BOTTLES DRAWN AEROBIC ONLY 10CC   Final   Culture  Setup Time     Final   Value: 09/03/2013 00:33     Performed at [facility]   Culture     Final   Value: NO GROWTH 5 DAYS     Performed at [facility]   Report Status 09/09/2013 FINAL   Final  RESPIRATORY VIRUS PANEL     Status: None   Collection Time    09/02/13  9:55 PM      Result Value Ref Range Status   Source - RVPAN NASAL SWAB   Corrected   Comment: CORRECTED ON 02/20 AT 0646: PREVIOUSLY REPORTED AS NASAL SWAB   Respiratory Syncytial Virus A NOT DETECTED   Final   Respiratory Syncytial Virus B NOT DETECTED   Final   Influenza A NOT DETECTED   Final   Influenza B NOT DETECTED   Final   Parainfluenza 1 NOT DETECTED   Final   Parainfluenza 2 NOT DETECTED   Final   Parainfluenza 3 NOT DETECTED   Final   Metapneumovirus NOT DETECTED   Final   Rhinovirus NOT DETECTED   Final   Adenovirus NOT  DETECTED   Final   Influenza A H1 NOT DETECTED   Final   Influenza A H3 NOT DETECTED   Final   Comment: (NOTE)           Normal Reference Range for each Analyte: NOT DETECTED     Testing performed using the Luminex xTAG Respiratory Viral Panel test     kit.     This test was developed and its performance characteristics determined     by [facility]. It has not been cleared or approved by the [place]     Food and Drug Administration. This test is used for clinical purposes.     It should not be regarded as investigational or for research. This     laboratory is certified under the [place] (CLIA) as qualified to perform high complexity     clinical laboratory testing.     Performed at [facility] DIFFICILE BY PCR     Status: None   Collection Time    09/03/13  5:47 PM      Result Value Ref Range Status   C difficile by pcr NEGATIVE  NEGATIVE Final  MRSA PCR SCREENING     Status: None   Collection Time    09/03/13 10:34 PM      Result Value Ref Range Status   MRSA by PCR NEGATIVE  NEGATIVE Final   Comment:            The GeneXpert MRSA Assay (FDA     approved for NASAL specimens     only), is one component of a     comprehensive MRSA colonization     surveillance program. It is not     intended to diagnose MRSA     infection nor to guide or     monitor treatment for     MRSA infections.   Studies/Results: No results found. Medications: I have reviewed the patient's current medications. Scheduled Meds: . amiodarone  200 mg Oral Daily  . antiseptic oral rinse  15 mL Mouth Rinse q12n4p  . aspirin EC  81 mg  Oral Daily  . benzonatate  200 mg Oral TID  . chlorhexidine  15 mL Mouth Rinse BID  . ferrous [id number] C-folic acid  1 capsule Oral Q lunch  . guaiFENesin  600 mg Oral BID  . loratadine  10 mg Oral Daily  . magnesium oxide  1,000 mg Oral BID  . pantoprazole  40 mg Oral Daily  .  pyridOXINE  100 mg Oral Q lunch  . sildenafil  20 mg Oral TID  . sodium chloride  3 mL Intravenous Q12H  . warfarin  1 each Does not apply Once  . Warfarin - Pharmacist Dosing Inpatient   Does not apply q1800   Continuous Infusions: . milrinone 0.25 mcg/kg/min (09/09/13 2312)   PRN Meds:.albuterol, alum & mag hydroxide-simeth, promethazine Assessment/Plan: Principal Problem:   Acute on chronic systolic heart failure Active Problems:   Atrial fibrillation   Chronic systolic heart failure   Chronic cough   Sleep apnea   Pneumonia   Acute respiratory failure   Acute pulmonary edema   LOS: 8 days   Gregg [name] is a 73 yo AA man with history of severe CHF (EF 10-15%) with ICD and IV milrinone and not a candidate for heart transplant, Afib on warfarin, OSA, allergic rhinitis, and GERD who presents with acute CHF with possible underlying CAP. His hospital course is complicated with need to be on BiPAP, and required aggressive diuresis. However, patient is improving clinically and has transitioned to oxygen on nasal canula.    # Acute CHF: Improving. Patient has basline NYHA Class IV Systolic CHF with non-ischemic CM treated with IV milrinon and digoxin at home, and he has ICD placed. He is not a transplant candidate. Patient is now 172lb, down from 185lb at [place]. CXR on 09/07/2013 shows improving pulmonary edema. Stopped lasix on 2/23 due to worsening Cr to 2.25 and CVP is at 5 but this was restarted on 2/26 after renal function had improved. He still requires O2 supplementation.  Plan:  -cont furosemide PO 40m BID, per HF team recs -Hold digoxin due to renal function.  -cont with primacor gtt -Sidenalfil 283mTID  -Aspirin 8113mD  -Hold ARB/ACEi due to AKI  -Daily weights 185>>172  # Community Acquired Pneumonia: Improving. Patient presented with severe cough with new infiltrates on admission CXR showing pulm edema vs PNA. Blood culture has not shown any growth to date. He  completed antibiotics as per regimen below. However, he is still requiring [place] O2 to maintain good sats.  Antibiotics:  Azithro 2/19 > 2/20  Ceftriaxone 2/19 >> 2/20  Cefepime 2/20 >> 2/24  Plan:  -no indication for further antibiotics  -Incentive spirometry to expand his lungs -Albuterol Nebs q6H  -Mucinex 600m4mD and tessalon 200 TID for cough  -Loratadine 10mg74m allergies  -Protonix 40mg 76m-Promethazine 6.25mg i60mtions q6H PRN  -Outpaitent PFT (09/14/13) to assess for obstructive/restrictive disease  -CBC in AM   # Hypercalcemia: Ca at 11.5. Etiology unclear. No extensive work up is needed at this unless Cal levels remain elevated.  -Monitor mental status and Ca level (10.9 today, only mildly elevated) -No IVF given patient's CHF   # Non-bloody watery diarrhea: Improved. Two days of similar diarrheal symptoms but no fever. Previous C diff PCR is negative. -Consider C diff toxins if symptoms continues with fever -BMP for Ca  # Atrial Fibrillation: Currently on amiodarone 200mg da49mand warfarin therapy. INR is still elevated at 3.28, but no signs of bleeding.  -  Monitor for bleeding, check CBC  -Warfarin per pharmacy  -Amiodarone 247m daily  -Check INR for therapy response   # AKI on CKD stage 2: current Cr is 1.47 with baseline at 1.3. Likely due to CHF or diuresis as renal function is improving as CHF is improving and when diuretics are held.  -Avoid nephrotoxins   # Normocytic anemia: Hgb stable at 11.1 from 12.2. Likely hemodilution from acute CHF. Pt with history of since 1 month ago. Etiology most likely due to CKD.  -Consider anemia panel  -Monitor for bleeding  -Monitor CBC   # Obstructive Sleep Apnea: [facility]  -Currently on [facility]  with CPAP at night  # Allergic Rhinitis - Pt reports symptoms controlled on home anti-histamine therapy (home cetirizine 10 mg daily).  -Loratadine 10 mg daily (forumalary)  -Consider nasal  corticosteroid, 1st line for AR   # GERD - currently without reflux symptoms. Pt on PPI (protonix) therapy at home.  -Protonix 40 mg daily   # Constipation - Currently without symptoms  -Continue home senna 8.6 mg daily  -Magnesium oxide daily  -Maalox [id number] PRN   # Diet: Heart healthy  # DVT Ppx: Coumadin per pharmacy, SCD's  # Code: DNR, DNI, POA is wife  Dispo: [facility] is [facility].   This is a [employer] information officerote.  The care of the patient was discussed with Dr. KaAlice Riegernd the assessment and plan formulated with their assistance.  Please see their attached note for official documentation of the daily encounter.  JiKonrad Saha/26/2015, 9:26 AM   I have seen the patient and reviewed the daily progress note by RuElvin SoS IV and discussed the care of the patient with them.  I agree with her documentation.  Signed:  RiJessee AversMD PGY-2 Internal Medicine Teaching Service Pager: [phone]

## 2013-09-10 NOTE — Discharge Summary (Signed)
Internal Medicine Teaching [facility] Discharge Note  Name: Gregg [name] MRN: [id number] DOB: 08-Jun-1941 73 y.o.  Date of Admission: 09/02/2013  4:39 PM Date of Discharge: 09/11/2013 Attending Physician: Gardiner Barefoot, MD  Discharge Diagnosis: Principal Problem:   Acute on chronic systolic heart failure Active Problems:   Atrial fibrillation   Chronic systolic heart failure   Chronic cough   Sleep apnea   Pneumonia   Acute respiratory failure   Acute pulmonary edema   Discharge Medications:   Medication List    STOP taking these medications       carvedilol 6.25 MG tablet  Commonly known as:  COREG     digoxin 0.125 MG tablet  Commonly known as:  LANOXIN     DULCOLAX 100 MG capsule  Generic drug:  docusate sodium     losartan 25 MG tablet  Commonly known as:  COZAAR     senna 8.6 MG Tabs tablet  Commonly known as:  SENOKOT     sodium chloride 0.9 % SOLN with milrinone 1 MG/ML SOLN 200 mcg/mL      TAKE these medications       acetaminophen 650 MG CR tablet  Commonly known as:  TYLENOL  Take 650 mg by mouth 2 (two) times daily as needed for pain.     amiodarone 400 MG tablet  Commonly known as:  PACERONE  Take 200 mg by mouth daily.     aspirin EC 81 MG tablet  Take 81 mg by mouth daily.     cetirizine 10 MG tablet  Commonly known as:  ZYRTEC  Take 10 mg by mouth daily.     FERROCITE PLUS PO  Take 1 tablet by mouth daily with lunch.     furosemide 40 MG tablet  Commonly known as:  LASIX  Take 40 mg by mouth 2 (two) times daily.     HM VITAMIN B100 COMPLEX PO  Take 1,000 mg by mouth daily with lunch.     hydrocortisone cream 1 %  Apply 1 application topically 2 (two) times daily as needed (for itching or tape irritation).     magnesium oxide 400 MG tablet  Commonly known as:  MAG-OX  Take 800-1,200 mg by mouth daily. 2 tablets (800 mg) every morning and 3 tablets (1200 mg) at lunch     milrinone 20 MG/100ML Soln infusion  Commonly known  as:  PRIMACOR  Inject 20.45 mcg/min into the vein continuous.     multivitamin with minerals tablet  Take 1 tablet by mouth daily with lunch.     potassium chloride 10 MEQ tablet  Commonly known as:  K-DUR  Take 2 tablets (20 mEq total) by mouth daily.     pyridOXINE 100 MG tablet  Commonly known as:  VITAMIN B-6  Take 100 mg by mouth daily with lunch.     sildenafil 20 MG tablet  Commonly known as:  REVATIO  Take 1 tablet (20 mg total) by mouth 3 (three) times daily.     warfarin 5 MG tablet  Commonly known as:  COUMADIN  Take 1 tablet (5 mg total) by mouth daily. 1 1/2 tablets (7.5mg ) Tuesdays and Fridays, 1 tablet (5 mg) on Sunday, Monday, Wednesday, Thursday, Saturday        Disposition and follow-up:   Gregg [name] was discharged from [facility] in Stable condition.  At the hospital follow up visit please address:  1. dCHF: Patient now requires oxygen and needs weight  and body fluid monitoring. Patient was taken off of digoxin because of AKI during diuresis. Consider resuming if cr. Is back to baseline. 2. Please check BMP for renal function and need for further potassium replacement. 3. Please assess need for O2 therapy 4. Afib: Need to follow up on INR to ensure therapeutic level (was supra-therapeutic at ~5), but HF team recommended DCCV.  Follow-up Appointments:  Discharge Orders   Future Appointments Provider Department Dept Phone   09/14/2013 1:00 PM [facility] [phone]   09/14/2013 2:00 PM Nyoka Cowden, MD [facility] [phone]   09/24/2013 10:45 AM [facility] Rem[facility] [phone]   Future Orders Complete By Expires   For home use only DME oxygen  As directed    Questions:     Mode or (Route):     Liters per Minute:  4   Frequency:     Oxygen conserving device:        Consultations:  Heart failure team  Procedures Performed:  Dg Chest 2  View  09/02/2013   CLINICAL DATA:  Cough and emesis.  Joint pain  EXAM: CHEST  2 VIEW  COMPARISON:  07/21/2013  FINDINGS: Stable appearance of dual-chamber left approach ICD/pacer. Right IJ central venous catheter is in stable position. Cardiomegaly which is also stable. New perihilar airspace disease, most dense on the right. No effusion or interstitial coarsening. No pneumothorax. No acute osseous findings.  IMPRESSION: Perihilar airspace disease is concerning for pneumonia given the focal appearance. Pulmonary edema can also have this appearance.   Electronically Signed   By: Tiburcio Pea M.D.   On: 09/02/2013 14:13   Dg Chest Port 1 View  09/07/2013   CLINICAL DATA:  Shortness of breath.  Pulmonary edema.  EXAM: PORTABLE CHEST - 1 VIEW  COMPARISON:  Poor Single view of the chest 09/05/2013 and 09/04/2013.  FINDINGS: AICD and right IJ catheter in place. Cardiomegaly is again seen. Pulmonary edema persists but has improved. No pneumothorax or pleural effusion is identified.  IMPRESSION: Continued improvement in pulmonary edema.  No new abnormality.   Electronically Signed   By: Drusilla Kanner M.D.   On: 09/07/2013 07:30   Dg Chest Port 1 View  09/05/2013   CLINICAL DATA:  Follow-up pulmonary edema.  EXAM: PORTABLE CHEST - 1 VIEW  COMPARISON:  DG CHEST 1V PORT dated 09/04/2013; DG CHEST 2 VIEW dated 09/02/2013; DG CHEST 2 VIEW dated 07/21/2013  FINDINGS: Interval improvement in the diffuse interstitial and airspace pulmonary edema since the examination yesterday, though moderate to severe airspace opacities persist throughout both lungs. No new pulmonary parenchymal abnormality. Right jugular tunneled central venous catheter tip projects over the lower SVC at or near the cavoatrial junction, unchanged. Left subclavian pacing defibrillator unchanged.  IMPRESSION: Improvement in the CHF since yesterday, though moderate to severe interstitial and airspace pulmonary edema persists. No new abnormalities.    Electronically Signed   By: Hulan Saas M.D.   On: 09/05/2013 16:54   Dg Chest Port 1 View  09/04/2013   CLINICAL DATA:  Evaluate pulmonary edema ; leukocytosis, fever  EXAM: PORTABLE CHEST - 1 VIEW  COMPARISON:  Portable exam 0907 hr compared to 09/02/2013  FINDINGS: Right jugular central venous catheter with tip projecting over SVC.  Left subclavian AICD leads project over right atrium and right ventricle.  Enlargement of cardiac silhouette with pulmonary vascular congestion.  Diffuse bilateral airspace infiltrates significantly increased since previous exam could represent  pulmonary edema or pneumonia.  No gross pleural effusion or pneumothorax.  IMPRESSION: Enlargement of cardiac silhouette with pulmonary vascular congestion post AICD.  Significantly increased bilateral airspace infiltrates which could represent pulmonary edema or pneumonia.   Electronically Signed   By: Ulyses SouthwardMark  Boles M.D.   On: 09/04/2013 09:21   Admission HPI:  Gaynelle CageFrank Deguia is a 73 year old man with past medical history of CHF (EF 10-15%, 09/2012) with ICD in place, on chronic outpatient milrinone, CKD Stage III, afib on coumadin, OSA on CPAP, chronic 3 month cough and past tobacco abuse who presents with worsening cough. The patient notes a 2-week history of worsening cough, rarely productive of scant clear mucous, worse at night (awakening patient from sleep) though also present throughout the day. At symptom onset 2 weeks ago, he noted associated rhinorrhea, "sinus headache", and a sensation of post-nasal drip, though these associated symptoms have now improved. He notes some muscle and joint pains associated with vigorous coughing, and post-tussive emesis for the last 1-2 weeks, but no sick contacts, fevers, dyspnea, sinus tenderness, or chest pain. He has a history of CHF, but notes no weight gain, LE edema, change in his baseline 2-pillow orthopnea, or PND. He notes compliance with home CPAP.   Chart review reveals that the  patient has had a chronic cough, waxing and waning between Aug 2013 - Nov 2014, though notably worse since Nov 2014. He has been evaluated by Pulmonology for the last 1 month for work-up of this issue, thought to be upper airway cough syndrome vs GERD, among other possibilities. Symptoms improved slightly with a short course of prednisone 08/10/13, and He has been using Zyrtec, albuterol inhaler, and PPI. He received influenza vaccine.   Hospital Course by problem list: Principal Problem:   Acute on chronic systolic heart failure Active Problems:   Atrial fibrillation   Chronic systolic heart failure   Chronic cough   Sleep apnea   Pneumonia   Acute respiratory failure   Acute pulmonary edema   Acute on chronic CHF: CXR on 09/04/2013 showed acute onset of pulmonary edema. Patient was initially very dyspneic required BiPAP. He was transiently managed in the ICU but intubation was not required due to improvement and DNR/DNI (after discussion with patient and his wife). Dr Gala RomneyBensimhon of the heart failure team was consulted and his was closely involved in his care. Aggressive diuretic therapy was complicated with rising creatinine to a peak of 2.25 and there it was held until renal function recovered. His weight improved 185lb on admission to 170 by the time of discharge and his symptoms were significantly better. Digoxin was also held due to renal function, but should consider restarting as Cr is back to baseline (1.13) as of 09/11/2013. His was discharged to a skilled nursing home on his milrinone IV via home health services.   CAP: Patient presented with fever, CXR on admission showing pulmonary edema vs PNA, elevated WBC (20), but negative blood cultures. Course complicated by acute CHF, but patient's symptoms resolved with aggressive diuresis. See his Antibiotics regimen below:  Azithro 2/19 > 2/20  Ceftriaxone 2/19 >> 2/20  Cefepime 2/20 >> 2/24 However, patient required 5-6L of oxygen on  discharge.  Afib: Patient continued on amiodarone, but INR was discovered to be supra-therapeutic at ~5 during admission. INR on discharge is 2.87. Warfarin was held. Need to check INR at follow up and dose warfarin accordingly.   Pre-renal AKI on CKD stage 2: Due to diuresis, Cr elevated to 2.27, but  resolved to Cr. Of 1.13 with holding of aggressive diuresis. Digoxin was held due to renal function. Need to consider re-starting digoxin.   Patient was treated with home regimen for allergic rhinitis, and GERD.  Discharge Vitals:  BP 104/60  Pulse 72  Temp(Src) 97.7 F (36.5 C) (Oral)  Resp 24  Ht 5\' 10"  (1.778 m)  Wt 170 lb 10.2 oz (77.4 kg)  BMI 24.48 kg/m2  SpO2 99%  Discharge Labs:  Results for orders placed during the hospital encounter of 09/02/13 (from the past 24 hour(s))  PROTIME-INR     Status: Abnormal   Collection Time    09/11/13  4:40 AM      Result Value Ref Range   Prothrombin Time 29.1 (*) 11.6 - 15.2 seconds   INR 2.87 (*) 0.00 - 1.49  BASIC METABOLIC PANEL     Status: Abnormal   Collection Time    09/11/13  4:40 AM      Result Value Ref Range   Sodium 142  137 - 147 mEq/L   Potassium 3.4 (*) 3.7 - 5.3 mEq/L   Chloride 104  96 - 112 mEq/L   CO2 35 (*) 19 - 32 mEq/L   Glucose, Bld 90  70 - 99 mg/dL   BUN 39 (*) 6 - 23 mg/dL   Creatinine, Ser 9.60  0.50 - 1.35 mg/dL   Calcium 45.4 (*) 8.4 - 10.5 mg/dL   GFR calc non Af Amer 64 (*) >90 mL/min   GFR calc Af Amer 74 (*) >90 mL/min    Signed: Dow Adolph 09/11/2013, 2:39 PM   Time Spent on Discharge: 45 minutes Services Ordered on Discharge: Skilled nursing facilitty Equipment Ordered on Discharge: Rolling walker with 5" wheels, Oxygen therapy

## 2013-09-10 NOTE — Progress Notes (Signed)
Physical Therapy Treatment Patient Details Name: Gregg [name] MRN: [id number] DOB: April 19, 1941 Today's Date: 09/10/2013 Time: 3016-0109 PT Time Calculation (min): 29 min  PT Assessment / Plan / Recommendation  History of Present Illness Gregg [name] is a 73 y.o. 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 Admitted with respiratory failure thought primarily due to PNA with component of HF   PT Comments   Pt progressing with gait and cognition. On arrival O2 at 88% on 4L transitioned pt to 55% venturi mask with return to 95% in 3 min. Remained on venturi throughout with sats 92-97% with gait with return to 6L end of session [place] and sats 95% at rest. Pt educated for HEP, and mobility with encouragement to continue with staff. RN aware of O2 requirements and mobility.   Follow Up Recommendations  Home health PT;Supervision/Assistance - 24 hour     Does the patient have the potential to tolerate intense rehabilitation     Barriers to Discharge        Equipment Recommendations  Rolling walker with 5" wheels    Recommendations for Other Services    Frequency     Progress towards PT Goals Progress towards PT goals: Progressing toward goals  Plan Current plan remains appropriate    Precautions / Restrictions Precautions Precautions: Fall Restrictions Weight Bearing Restrictions: No   Pertinent Vitals/Pain No pain    Mobility  Bed Mobility Overal bed mobility: Needs Assistance Bed Mobility: Supine to Sit Supine to sit: Supervision General bed mobility comments: cueing for sequence, use of rail and increased time Transfers Overall transfer level: Needs assistance Equipment used: Rolling walker (2 wheeled) Transfers: Sit to/from Stand Sit to Stand: Min guard General transfer comment: cueing for hand placement and safety as pt grabbing RW throughout  despite cueing Ambulation/Gait Ambulation/Gait assistance: Min guard Ambulation Distance (Feet): 200 Feet Assistive device: Rolling walker (2 wheeled) Gait Pattern/deviations: Step-through pattern;Decreased stride length Gait velocity interpretation: at or above normal speed for age/gender General Gait Details: pt with good posture today and needed directional cues    Exercises General Exercises - Lower Extremity Long Arc Quad: AROM;Seated;Both;20 reps Hip Flexion/Marching: AROM;Seated;Both;20 reps   PT Diagnosis:    PT Problem List:   PT Treatment Interventions:     PT Goals (current goals can now be found in the care plan section)    Visit Information  Last PT Received On: 09/10/13 Assistance Needed: +1 History of Present Illness: Gregg [name] is a 73 y.o. 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 Admitted with respiratory failure thought primarily due to PNA with component of HF    Subjective Data      Cognition  Cognition Arousal/Alertness: Awake/alert Behavior During Therapy: WFL for tasks assessed/performed Overall Cognitive Status: Impaired/Different from baseline Area of Impairment: Orientation Orientation Level: Time Memory: Decreased short-term memory General Comments: pt much more alert and aware today, did not know time and a little confused with Oxygen set up    Balance     End of Session PT - End of Session Equipment Utilized During Treatment: Gait belt;Oxygen Activity Tolerance: Patient tolerated treatment well Patient left: in chair;with call bell/phone within reach Nurse Communication: Mobility status   GP     Toney Sang [facility] 09/10/2013, 10:58 AM Delaney Meigs, PT [phone]

## 2013-09-11 DIAGNOSIS — R570 Cardiogenic shock: Secondary | ICD-10-CM

## 2013-09-11 LAB — BASIC METABOLIC PANEL
BUN: 39 mg/dL — AB (ref 6–23)
CHLORIDE: 104 meq/L (ref 96–112)
CO2: 35 mEq/L — ABNORMAL HIGH (ref 19–32)
Calcium: 10.9 mg/dL — ABNORMAL HIGH (ref 8.4–10.5)
Creatinine, Ser: 1.13 mg/dL (ref 0.50–1.35)
GFR calc non Af Amer: 64 mL/min — ABNORMAL LOW (ref >90)
GFR, EST AFRICAN AMERICAN: 74 mL/min — AB (ref >90)
Glucose, Bld: 90 mg/dL (ref 70–99)
POTASSIUM: 3.4 meq/L — AB (ref 3.7–5.3)
Sodium: 142 mEq/L (ref 137–147)

## 2013-09-11 LAB — PROTIME-INR
INR: 2.87 — ABNORMAL HIGH (ref 0.00–1.49)
PROTHROMBIN TIME: 29.1 s — AB (ref 11.6–15.2)

## 2013-09-11 MED ORDER — POTASSIUM CHLORIDE ER 10 MEQ PO TBCR: 20.0000 meq | EXTENDED_RELEASE_TABLET | Freq: Every day | ORAL | Status: AC

## 2013-09-11 MED ORDER — WARFARIN SODIUM 2.5 MG PO TABS
2.5000 mg | ORAL_TABLET | Freq: Once | ORAL | Status: AC
  Administered 2013-09-11: 2.5 mg via ORAL
  Filled 2013-09-11: qty 1

## 2013-09-11 MED ORDER — POTASSIUM CHLORIDE CRYS ER 20 MEQ PO TBCR
20.0000 meq | EXTENDED_RELEASE_TABLET | Freq: Two times a day (BID) | ORAL | Status: DC
  Administered 2013-09-11 – 2013-09-14 (×7): 20 meq via ORAL
  Filled 2013-09-11 (×8): qty 1

## 2013-09-11 MED ORDER — WARFARIN SODIUM 5 MG PO TABS: 5.0000 mg | ORAL_TABLET | Freq: Every day | ORAL | Status: DC

## 2013-09-11 MED ORDER — MILRINONE IN DEXTROSE 20 MG/100ML IV SOLN: 0.2500 ug/kg/min | INTRAVENOUS | Status: AC

## 2013-09-11 NOTE — Progress Notes (Signed)
Spoke with Systems developer.  Pt will not be getting a Pasarr number until case is discussed with their medical director on 09/14/13.  Unit CSW to follow.

## 2013-09-11 NOTE — Progress Notes (Addendum)
CSW submitted signed 30 day note, signed FL2, and H&P, and received [facility] back [facility] stating CSW uploaded the incorrect H&P, that the H&P uploaded was for another patient. CSW deleted this H&P and uploaded the correct H&P. Reviewer asked why pt needs placement in [place], and CSW explained pt is on milrinone drip and that the facilities [place] are unable to manage this but [facility] [place] [facility] and has offered a bed. PASARR reviewer states she will not give a PASARR because pt cannot go to SNF on milrinone. CSW explained we send pts on [facility] drip [facility] [place] and that they can manage the medication. Facility works closely with [facility] and [facility], and pt has been approved to go to facility. Pt ready for discharge today.  PASARR reviewer refused to provide PASARR number. CSW asked if reviewer wants confirmation from the facility, explaining CSW would provide [facility] for [facility] and they can verify they take pts on milrinone. Reviewer explained they would need a note from the MD stating pt can have [facility]one [facility] at [facility] [place], and CSW states this would not be a problem, CSW can do this. Reviewer states she spoke with her supervisor, and that they will not give a PASARR number because SNFs do not take pts on milrinone. CSW called back reviewer after hanging up to request contact information for her supervisor, as this is delaying discharge and [facility]. CSW left voicemail for PASARR reviewer and is waiting for return call. CSW called PASARR help desk and explained situation, asking for help in contacting someone as this is delaying discharge and [facility] does take milrinone patients. CSW on hold with PASARR.  Addendum: CSW left voicemail for PASARR review's supervisor Jesusita OkaSabrina Lee [phone]). CSW explained pt is ready for discharge and that he has a bed at a SNF that manages milrinone  medication and that PASARR reviewer Ms. Sherryle LisDolan refused to give CSW a Psychologist, forensicASARR. CSW requested call back immediately as this is delaying discharge and [facility] and can take him today.  Addendum: CSW called [facility] and asked them to call Jesusita OkaSabrina Lee to verify they take milrinone patients and have offered a bed to pt. [facility] explained to [facility] that pt is ready to discharge today. Facility states they will call Sabrina.  Addendum: CSW e-mailed signed FL2 and signed 30 day note to Jesusita OkaSabrina Lee for PASARR review. CSW explained in e-mail that [facility]  called and left her a message confirming they do take pts on the milrinone drip. [facility] explained their administrators are leaving for the day at 4:30 and if pt gets PASARR after 4:30pm to inform Almira CoasterGina. CSW informed Augusto GambleJody, third shift CSW about situation with PASARR request and provided Jody's phone number for PASARR to follow up with PASARR number. [facility] called in [place] and explained they won't be able to take pt today but would be able to take him either tomorrow if we provide the milrinone until they can get it ordered Monday, or they will need to order the med today to have it delivered Monday. Facility also wondering if pt has own pump or if facility needs to provide this.   Addendum: PASARR reviewer supervisor asked for pt's MAR, and CSW sent this in both an e-mail and over DHHS online portal. CSW provided phone number for 3rd shift CSW and explained they should follow up with  Jody for PASARR determination. CSW received call from [facility] explaining they are unable to verify insurance as the birthday is wrong in the Medicare system. CSW asked what birthday they are attempting to put in the computer and they stated 1941/04/14. CSW confirmed this is the birth date in our chart. Facility states Medicare must have incorrect information somewhere in their system, so they cannot take pt  without insurance verification and a PASARR. CSW called RN and asked if they can make sure pt is up and moving as much as possible over the weekend so he gets strong enough that he could potentially go home on Monday with home health. RN states she will relay this message. CSW to update pt on struggle with placement up to this point.  Maryclare Labrador, MSW, [facility] Clinical Social Worker [phone]

## 2013-09-11 NOTE — Progress Notes (Signed)
ANTICOAGULATION CONSULT NOTE - Follow Up Consult  Pharmacy Consult for warfarin Indication: atrial fibrillation  No Known Allergies  Patient Measurements: Height: 5\' 10"  (177.8 cm) Weight: 170 lb 10.2 oz (77.4 kg) IBW/kg (Calculated) : 73   Vital Signs: Temp: 98 F (36.7 C) (02/27 0400) Temp src: Oral (02/27 0400) BP: 104/56 mmHg (02/27 0400) Pulse Rate: 71 (02/27 0400)  Labs:  Recent Labs  09/09/13 0605 09/10/13 0410 09/11/13 0440  LABPROT 39.5* 32.2* 29.1*  INR 4.29* 3.28* 2.87*  CREATININE 1.42*  --  1.13    Estimated Creatinine Clearance: 61.9 ml/min (by C-G formula based on Cr of 1.13).   Medications:  Scheduled:  . amiodarone  200 mg Oral Daily  . antiseptic oral rinse  15 mL Mouth Rinse q12n4p  . aspirin EC  81 mg Oral Daily  . benzonatate  200 mg Oral TID  . chlorhexidine  15 mL Mouth Rinse BID  . ferrous fumarate-b12-vitamic C-folic acid  1 capsule Oral Q lunch  . furosemide  40 mg Oral BID  . guaiFENesin  600 mg Oral BID  . loratadine  10 mg Oral Daily  . magnesium oxide  1,000 mg Oral BID  . pantoprazole  40 mg Oral Daily  . pyridOXINE  100 mg Oral Q lunch  . sildenafil  20 mg Oral TID  . sodium chloride  3 mL Intravenous Q12H  . warfarin  1 each Does not apply Once  . Warfarin - Pharmacist Dosing Inpatient   Does not apply q1800    Assessment: 33 YOM on warfarin PTA for AFib admitted 09/02/2013.   Pharmacy consulted to dose warfarin & cefepime   PMH: CHF - EF 10-15%, ICD, chronic outpt milrinone, GERD, Anemia, CKD Stage III, afib on coumadin, OSA on CPAP, chronic 3 mos cough, past tobacco  AC: Warfarin PTA for afib. INR therapeutic on admit. Restarted on home dose: 5 mg daily x 7.5 mg Tues, Fri; INR started to rise 2/22; INR remains elevated x 6 days with no Coumadin. Hgb slight trend down and plts have remained stable (last CBC 2/23), no bleeding noted. Patient is on amiodarone, however this is a home medication which should not be acutely  affecting INR.  Goal of therapy: INR 2-3  Plan: - Resume Coumadin with 2.5 mg x 1 tonight. - Continue daily INR. - Recheck CBC in AM.  Reece Leader, Loura Back, BCPS  Clinical Pharmacist Pager [phone]  09/11/2013 7:08 AM

## 2013-09-11 NOTE — Progress Notes (Signed)
Clinical Social Work Department BRIEF PSYCHOSOCIAL ASSESSMENT 09/11/2013  Patient:  Gregg, [name]     Account Number:  [id number]     Admit date:  09/02/2013  Clinical Social Worker:  Varney Biles  Date/Time:  09/11/2013 12:56 PM  Referred by:  Physician  Date Referred:  09/11/2013 Referred for  SNF Placement   Other Referral:   Interview type:  Patient Other interview type:   Family members also in room.    PSYCHOSOCIAL DATA Living Status:  FAMILY Admitted from facility:   Level of care:   Primary support name:  Gregg [name] [phone]) Primary support relationship to patient:  SPOUSE Degree of support available:   Good--pt lives at home with wife and had home health prior to hospital admission.    CURRENT CONCERNS Current Concerns  Post-Acute Placement   Other Concerns:    SOCIAL WORK ASSESSMENT / PLAN Pt plan had been [facility], but pt is doing too well to medically qualify for LTACH. Recommendation now for SNF. Family is from [place] and would like [facility]. CSW has made referral to [facility] in [place], [place]. [facility] and [facility] reviewing [facility] now. CSW faxed clinicals to all SNFs in [facility] as well, as CSW explained to family that if [place] or [place] are unable to take pt, there might be a SNF in [facility] that takes milrinone patients.   Assessment/plan status:  Psychosocial Support/Ongoing Assessment of Needs Other assessment/ plan:   Information/referral to community resources:   SNF    PATIENT'S/FAMILY'S RESPONSE TO PLAN OF CARE: Good--pt happy to hear he is doing well and no longer needs LTACH. Pt and family hopeful he can get placement in [place], and CSW and RNCM assured pt that placement will be short-term and pt and family happy to hear this and say whatever needs to be done is what they will do to ensure pt is strong enough to go home, whether that means placement in [place] or [place].  Pt currently has no bed offers--[facility] has been back and forth on the phone with CSW stating they believe they might be able to take pt they just need to make sure they can take the milrinone and are checking with their management. CSW to update pt/family when pt gets bed offers.       Maryclare Labrador, MSW, [facility] Clinical Social Worker [phone]

## 2013-09-11 NOTE — Progress Notes (Signed)
Medical Student Daily Progress Note  Subjective: Patient was able to ambulate with oxygen saturation of 92-96% on 6L of oxygen with physical therapists. No other events overnight.   Objective: Vital signs in last 24 hours: Filed Vitals:   09/11/13 0005 09/11/13 0400 09/11/13 0800 09/11/13 0937  BP: 99/56 104/56 104/79   Pulse: 75 71  73  Temp: 98.2 F (36.8 C) 98 F (36.7 C)  97.2 F (36.2 C)  TempSrc: Oral Oral  Oral  Resp: _0 Height:      Weight:  77.4 kg (170 lb 10.2 oz)    SpO2: 98% 99%  99%   Weight change: -0.8 kg (-1 lb 12.2 oz)  Intake/Output Summary (Last 24 hours) at 09/11/13 1052 Last data filed at 09/11/13 7124  Gross per 24 hour  Intake  987.2 ml  Output   2075 ml  Net -1087.8 ml   Physical Exam: BP 104/79  Pulse 73  Temp(Src) 97.2 F (36.2 C) (Oral)  Resp 19  Ht _1  (1.778 m)  Wt 77.4 kg (170 lb 10.2 oz)  BMI 24.48 kg/m2  SpO2 99% General appearance: alert, cooperative and no distress Lungs: clear to auscultation bilaterally Heart: regular rate, faint S2, but both heart sounds were heard, no murmurs Abdomen: soft, non-tender; bowel sounds normal; no masses,  no organomegaly Extremities: extremities normal, atraumatic, no cyanosis or edema Pulses: 2+ and symmetric Lab Results: BMET    Component Value Date/Time   NA 142 09/11/2013 0440   K 3.4* 09/11/2013 0440   CL 104 09/11/2013 0440   CO2 35* 09/11/2013 0440   GLUCOSE 90 09/11/2013 0440   BUN 39* 09/11/2013 0440   CREATININE 1.13 09/11/2013 0440   CALCIUM 10.9* 09/11/2013 0440   GFRNONAA 64* 09/11/2013 0440   GFRAA 74* 09/11/2013 0440   Lab Results  Component Value Date   INR 2.87* 09/11/2013   INR 3.28* 09/10/2013   INR 4.29* 09/09/2013     Micro Results: Recent Results (from the past 240 hour(s))  CULTURE, BLOOD (ROUTINE X 2)     Status: None   Collection Time    09/02/13  9:45 PM      Result Value Ref Range Status   Specimen Description BLOOD RIGHT ARM   Final   Special  Requests BOTTLES DRAWN AEROBIC AND ANAEROBIC 10CC EACH   Final   Culture  Setup Time     Final   Value: 09/03/2013 00:33     Performed at [facility]   Culture     Final   Value: NO GROWTH 5 DAYS     Performed at [facility]   Report Status 09/09/2013 FINAL   Final  CULTURE, BLOOD (ROUTINE X 2)     Status: None   Collection Time    09/02/13  9:55 PM      Result Value Ref Range Status   Specimen Description BLOOD RIGHT HAND   Final   Special Requests BOTTLES DRAWN AEROBIC ONLY 10CC   Final   Culture  Setup Time     Final   Value: 09/03/2013 00:33     Performed at [facility]     Final   Value: NO GROWTH 5 DAYS     Performed at [facility]   Report Status 09/09/2013 FINAL   Final  RESPIRATORY VIRUS PANEL     Status: None   Collection Time    09/02/13  9:55 PM  Result Value Ref Range Status   Source - RVPAN NASAL SWAB   Corrected   Comment: CORRECTED ON 02/20 AT 8938: PREVIOUSLY REPORTED AS NASAL SWAB   Respiratory Syncytial Virus A NOT DETECTED   Final   Respiratory Syncytial Virus B NOT DETECTED   Final   Influenza A NOT DETECTED   Final   Influenza B NOT DETECTED   Final   Parainfluenza 1 NOT DETECTED   Final   Parainfluenza 2 NOT DETECTED   Final   Parainfluenza 3 NOT DETECTED   Final   Metapneumovirus NOT DETECTED   Final   Rhinovirus NOT DETECTED   Final   Adenovirus NOT DETECTED   Final   Influenza A H1 NOT DETECTED   Final   Influenza A H3 NOT DETECTED   Final   Comment: (NOTE)           Normal Reference Range for each Analyte: NOT DETECTED     Testing performed using the Luminex xTAG Respiratory Viral Panel test     kit.     This test was developed and its performance characteristics determined     by [facility]. It has not been cleared or approved by the [place]     Food and Drug Administration. This test is used for clinical purposes.     It should not be regarded as investigational or for research. This      laboratory is certified under the [place] (CLIA) as qualified to perform high complexity     clinical laboratory testing.     Performed at [facility] DIFFICILE BY PCR     Status: None   Collection Time    09/03/13  5:47 PM      Result Value Ref Range Status   C difficile by pcr NEGATIVE  NEGATIVE Final  MRSA PCR SCREENING     Status: None   Collection Time    09/03/13 10:34 PM      Result Value Ref Range Status   MRSA by PCR NEGATIVE  NEGATIVE Final   Comment:            The GeneXpert MRSA Assay (FDA     approved for NASAL specimens     only), is one component of a     comprehensive MRSA colonization     surveillance program. It is not     intended to diagnose MRSA     infection nor to guide or     monitor treatment for     MRSA infections.   Studies/Results: No results found. Medications: I have reviewed the patient's current medications. Scheduled Meds: . amiodarone  200 mg Oral Daily  . antiseptic oral rinse  15 mL Mouth Rinse q12n4p  . aspirin EC  81 mg Oral Daily  . benzonatate  200 mg Oral TID  . chlorhexidine  15 mL Mouth Rinse BID  . ferrous [id number]-[id number] C-folic acid  1 capsule Oral Q lunch  . furosemide  40 mg Oral BID  . guaiFENesin  600 mg Oral BID  . loratadine  10 mg Oral Daily  . magnesium oxide  1,000 mg Oral BID  . pantoprazole  40 mg Oral Daily  . pyridOXINE  100 mg Oral Q lunch  . sildenafil  20 mg Oral TID  . sodium chloride  3 mL Intravenous Q12H  . warfarin  2.5 mg Oral ONCE-1800  . warfarin  1  each Does not apply Once  . Warfarin - Pharmacist Dosing Inpatient   Does not apply q1800   Continuous Infusions: . milrinone 0.25 mcg/kg/min (09/11/13 0546)   PRN Meds:.albuterol, alum & mag hydroxide-simeth, promethazine Assessment/Plan: Principal Problem:   Acute on chronic systolic heart failure Active Problems:   Atrial fibrillation   Chronic systolic heart failure    Chronic cough   Sleep apnea   Pneumonia   Acute respiratory failure   Acute pulmonary edema   LOS: 9 days   Mr. Mullarkey is a 73 yo AA man with history of severe CHF (EF 10-15%) with ICD and IV milrinone and not a candidate for heart transplant, Afib on warfarin, OSA, allergic rhinitis, and GERD who presents with acute CHF with possible underlying CAP. His hospital course is complicated with need to be on BiPAP, and required aggressive diuresis. However, patient is improving clinically and has transitioned to oxygen on nasal canula.   # Acute CHF: Improving. Patient has basline NYHA Class IV Systolic CHF with non-ischemic CM treated with IV milrinon and digoxin at home, and he has ICD placed. He is not a transplant candidate. Patient is now 172lb, down from 185lb at [place]. CXR on 09/07/2013 shows improving pulmonary edema. Stopped lasix on 2/23 due to worsening Cr to 2.25 and CVP is at 5. Cr is currently at 1.13, which is at baseline. Currently on [place] oxygen with good saturation.  Plan:  -Continue furosemide PO 62m BID  -Can consider restarting digoxin at HF team rec -Appreciate HF team recs  -Continuous milrinone per pharmacy  -Sidenalfil 251mTID  -Aspirin 8155mD  -Hold ACEi and carvedilol due to low BP -Daily weights   # Acute on chronic cough likely caused by CAP: Improving. Patient presented with severe cough with new infiltrates on admission CXR showing pulm edema vs. PNA. Blood culture has not shown any growth to date. Patient has had 6 days of antibiotics and is on 6L oxygen.   Antibiotics:  Azithro 2/19 >>> 2/20  Ceftriaxone 2/19 >>> 2/20  Cefepime 2/20 >>> 2/24  Vancomycin 2/20 >>> 2/22  Plan:  - no more antibiotics indicated.  -Incentive spirometry  -Albuterol Nebs q6H  -Mucinex 600m58mD and tessalon 200 TID for cough  -Loratadine 10mg43m allergies  -Protonix 40mg 34m-Promethazine 6.25mg i37mtions q6H PRN  -Outpaitent PFT (09/14/13) to assess for  obstructive/restrictive disease  -CBC in AM  - will be discharged to SNF today once the bed is available.   # Hypercalcemia: Ca at 10.7, improving from 11.5; -Monitor mental status and Ca level  -On furosemide 40mg PO53m  # Non-bloody watery diarrhea: Two days of similar diarrheal symptoms but no fever. Previous C diff PCR is negative.  -Consider C diff toxins if symptoms continues with fever  -BMP for Ca   # Atrial Fibrillation: Currently on amiodarone 200mg dai26mnd warfarin therapy. INR is now therapeutic at 2.87, and no signs of bleeding.  -Monitor for bleeding, check CBC  -Restart warfarin per pharmacy  -Amiodarone 200mg dail54mCheck INR for therapy response   # AKI on CKD stage 2: current Cr is 1.13 with baseline at 1.3. AKI is resolved.  # Normocytic anemia: Hgb stable at 11.1 from 12.2. Likely hemodilution from acute CHF. Pt with history of since 1 month ago. Etiology most likely due to CKD.  -Consider anemia panel  -Monitor for bleeding  -Monitor CBC   # Obstructive Sleep Apnea: Patient reports compliance with CPAP at  [facility].  -Currently on 6L [place] with CPAP at night   # Allergic Rhinitis - Pt reports symptoms controlled on home anti-histamine therapy (home cetirizine 10 mg daily).  -Loratadine 10 mg daily (forumalary)  -Consider nasal corticosteroid, 1st line for AR   # GERD - currently without reflux symptoms. Pt on PPI (protonix) therapy at home.  -Protonix 40 mg daily   # Constipation - Currently without symptoms  -Continue home senna 8.6 mg daily  -Magnesium oxide daily  -Maalox 82m q6H PRN   # Diet: Heart healthy  # DVT Ppx: Coumadin per pharmacy, SCD's  # Code: DNR, DNI, POA is wife    Dispo: patient is waiting for [facility] bed, or even SNF.    This is a MCareers information officerNote.  The care of the patient was discussed with Dr. CLinus Salmonsand the assessment and plan formulated with their assistance.  Please see their attached note for official documentation of the  daily encounter.  JKonrad Saha2/27/2015, 10:52 AM  I have seen the patient and reviewed the daily progress note by RElvin SoMS IV and discussed the care of the patient with them.  I agree with her documentation. Signed:  RJessee Avers MD PGY-2 Internal Medicine Teaching Service Pager: [phone]

## 2013-09-11 NOTE — Progress Notes (Signed)
Patient ID: Gregg [name], male   DOB: 12-Aug-1940, 73 y.o.   MRN: [id number]   SUBJECTIVE: Gregg [name] is a 73 y.o. 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 On 04/02/12 underwent neurocognitive testing.and is not a candidate for advanced therapies due to results for neurocognitive results. Maintained on home milrinone.   ECHO 09/2012: EF 10-15% RV moderately HK moderate MR   Admitted with respiratory failure thought primarily due to PNA with component of HF.   Much improved today. Walked 300 feet with PT. Sats good on 6L. Denies SOB.   Creatinine 1.6>2.15>2.27>1.8>1.42>1.13 INR 5.57> 4.29 > 3.2>2.9   CVP  Co-ox 75%.  Scheduled Meds: . amiodarone  200 mg Oral Daily  . antiseptic oral rinse  15 mL Mouth Rinse q12n4p  . aspirin EC  81 mg Oral Daily  . benzonatate  200 mg Oral TID  . chlorhexidine  15 mL Mouth Rinse BID  . ferrous fumarate-b12-vitamic C-folic acid  1 capsule Oral Q lunch  . furosemide  40 mg Oral BID  . guaiFENesin  600 mg Oral BID  . loratadine  10 mg Oral Daily  . magnesium oxide  1,000 mg Oral BID  . pantoprazole  40 mg Oral Daily  . pyridOXINE  100 mg Oral Q lunch  . sildenafil  20 mg Oral TID  . sodium chloride  3 mL Intravenous Q12H  . warfarin  2.5 mg Oral ONCE-1800  . warfarin  1 each Does not apply Once  . Warfarin - Pharmacist Dosing Inpatient   Does not apply q1800   Continuous Infusions: . milrinone 0.25 mcg/kg/min (09/11/13 0546)   PRN Meds:.albuterol, alum & mag hydroxide-simeth, promethazine    Filed Vitals:   09/10/13 1700 09/10/13 2013 09/11/13 0005 09/11/13 0400  BP: 105/56 107/58 99/56 104/56  Pulse: 70 70 75 71  Temp:   98.2 F (36.8 C) 98 F (36.7 C)  TempSrc:   Oral Oral   Resp: 20 22 22 20   Height:      Weight:    77.4 kg (170 lb 10.2 oz)  SpO2: 100% 100% 98% 99%    Intake/Output Summary (Last 24 hours) at 09/11/13 4098 Last data filed at 09/11/13 0700  Gross per 24 hour  Intake  987.2 ml  Output   1675 ml  Net -687.8 ml    LABS: Basic Metabolic Panel:  Recent Labs  11/91/47 0605 09/11/13 0440  NA 142 142  K 3.8 3.4*  CL 100 104  CO2 31 35*  GLUCOSE 94 90  BUN 75* 39*  CREATININE 1.42* 1.13  CALCIUM 11.5* 10.9*   Liver Function Tests: No results found for this basename: AST, ALT, ALKPHOS, BILITOT, PROT, ALBUMIN,  in the last 72 hours No results found for this basename: LIPASE, AMYLASE,  in the last 72 hours CBC: No results found for this basename: WBC, NEUTROABS, HGB, HCT, MCV, PLT,  in the last 72 hours Cardiac Enzymes: No results found for this basename: CKTOTAL, CKMB, CKMBINDEX, TROPONINI,  in the last 72 hours BNP: No components found with this basename: POCBNP,  D-Dimer: No results found for this basename: DDIMER,  in the last 72 hours Hemoglobin A1C: No results found for this basename: HGBA1C,  in the last 72 hours Fasting Lipid  Panel: No results found for this basename: CHOL, HDL, LDLCALC, TRIG, CHOLHDL, LDLDIRECT,  in the last 72 hours Thyroid Function Tests: No results found for this basename: TSH, T4TOTAL, FREET3, T3FREE, THYROIDAB,  in the last 72 hours Anemia Panel: No results found for this basename: VITAMINB12, FOLATE, FERRITIN, TIBC, IRON, RETICCTPCT,  in the last 72 hours  RADIOLOGY: Dg Chest 2 View  09/02/2013   CLINICAL DATA:  Cough and emesis.  Joint pain  EXAM: CHEST  2 VIEW  COMPARISON:  07/21/2013  FINDINGS: Stable appearance of dual-chamber left approach ICD/pacer. Right IJ central venous catheter is in stable position. Cardiomegaly which is also stable. New perihilar airspace disease, most dense on the right. No effusion or interstitial coarsening. No pneumothorax. No acute osseous findings.  IMPRESSION:  Perihilar airspace disease is concerning for pneumonia given the focal appearance. Pulmonary edema can also have this appearance.   Electronically Signed   By: Tiburcio PeaJonathan  Watts M.D.   On: 09/02/2013 14:13   Dg Chest Port 1 View  09/05/2013   CLINICAL DATA:  Follow-up pulmonary edema.  EXAM: PORTABLE CHEST - 1 VIEW  COMPARISON:  DG CHEST 1V PORT dated 09/04/2013; DG CHEST 2 VIEW dated 09/02/2013; DG CHEST 2 VIEW dated 07/21/2013  FINDINGS: Interval improvement in the diffuse interstitial and airspace pulmonary edema since the examination yesterday, though moderate to severe airspace opacities persist throughout both lungs. No new pulmonary parenchymal abnormality. Right jugular tunneled central venous catheter tip projects over the lower SVC at or near the cavoatrial junction, unchanged. Left subclavian pacing defibrillator unchanged.  IMPRESSION: Improvement in the CHF since yesterday, though moderate to severe interstitial and airspace pulmonary edema persists. No new abnormalities.   Electronically Signed   By: Hulan Saashomas  Lawrence M.D.   On: 09/05/2013 16:54   Dg Chest Port 1 View  09/04/2013   CLINICAL DATA:  Evaluate pulmonary edema ; leukocytosis, fever  EXAM: PORTABLE CHEST - 1 VIEW  COMPARISON:  Portable exam 0907 hr compared to 09/02/2013  FINDINGS: Right jugular central venous catheter with tip projecting over SVC.  Left subclavian AICD leads project over right atrium and right ventricle.  Enlargement of cardiac silhouette with pulmonary vascular congestion.  Diffuse bilateral airspace infiltrates significantly increased since previous exam could represent pulmonary edema or pneumonia.  No gross pleural effusion or pneumothorax.  IMPRESSION: Enlargement of cardiac silhouette with pulmonary vascular congestion post AICD.  Significantly increased bilateral airspace infiltrates which could represent pulmonary edema or pneumonia.   Electronically Signed   By: Ulyses SouthwardMark  Boles M.D.   On: 09/04/2013 09:21    PHYSICAL  EXAM  General: NAD Lying in bed. + cough Neck: JVD 6, no thyromegaly or thyroid nodule.  Lungs: clear CV: Nondisplaced PMI.  Heart irregular S1/S2, no S3/S4, no murmur.  No peripheral edema.  No carotid bruit.  Normal pedal pulses.  Abdomen: Soft, nontender, no hepatosplenomegaly, no distention.  Neurologic: Alert and oriented x 3.  Psych: Normal affect. Extremities: No clubbing or cyanosis.   TELEMETRY: Vpacing HR 70s underlying rhythm AF  Assessment:   1) A/C systolic HF  - EF 10-15% (09/2012)  - on home milrinone 0.25  2) A/C respiratory failure  3) CAP ?  4) Afib s/p DC-CV 3/14 to NSR.   - on Coumadin  5) Acute on chronic renal failure 6) DNR   Plan/Discussion:    Doing very well. Respiratory status improved. Back on home lasix and milrinone. He is interested in SNF. Should be ready to go today if  bed available. (PT feels he is too good for LTACH). INR therapeutic.   BP still low. Would not restart Losartan or Carvedilol now. We can do that as an outpatient.  Continue current dose of Milrinone at 0.25 mcg which is his home dose.   Arvilla Meres, MD  8:12 AM

## 2013-09-11 NOTE — Progress Notes (Signed)
  Date: 09/11/2013  Patient name: Gregg [name]  Medical record number: [id number]  Date of birth: 1940-07-27   This patient has been seen and the plan of care was discussed with the house staff. Please see their note for complete details. I concur with their findings with the following additions/corrections:  He is doing well and able to walk up stairs without desaturation.  Ready for d/c when bed available.    Gardiner Barefoot, MD 09/11/2013, 10:49 AM

## 2013-09-11 NOTE — Discharge Instructions (Addendum)
Thank you for allowing us to be involved in your healthcare while you were hospitalized at [facility].   Please note that there have been changes to your home medications.  --> PLEASE LOOK AT YOUR DISCHARGE MEDICATION LIST FOR DETAILS.  Please take potassium pills 20 meq daily until you are instructed to stop   Please call your PCP if you have any questions or concerns, or any difficulty getting any of your medications.  Please return to the ER if you have worsening of your symptoms or new severe symptoms arise.        Information on my medicine - Coumadin   (Warfarin)  This medication education was reviewed with me or my healthcare representative as part of my discharge preparation.  The pharmacist that spoke with me during my hospital stay was:  Gardner CandleCarney, Jessica C, [facility]  Why was Coumadin prescribed for you? Coumadin was prescribed for you because you have a blood clot or a medical condition that can cause an increased risk of forming blood clots. Blood clots can cause serious health problems by blocking the flow of blood to the heart, lung, or brain. Coumadin can prevent harmful blood clots from forming. As a reminder your indication for Coumadin is:   Stroke Prevention Because Of Atrial Fibrillation  What test will check on my response to Coumadin? While on Coumadin (warfarin) you will need to have an INR test regularly to ensure that your dose is keeping you in the desired range. The INR (international normalized ratio) number is calculated from the result of the laboratory test called prothrombin time (PT).  If an INR APPOINTMENT HAS NOT ALREADY BEEN MADE FOR YOU please schedule an appointment to have this lab work done by your health care provider within 7 days. Your INR goal is usually a number between:  2 to 3 or your provider may give you a more narrow range like 2-2.5.  Ask your health care provider during an office visit what your goal INR is.  What  do  you need to  know  About  COUMADIN? Take Coumadin (warfarin) exactly as prescribed by your healthcare provider about the same time each day.  DO NOT stop taking without talking to the doctor who prescribed the medication.  Stopping without other blood clot prevention medication to take the place of Coumadin may increase your risk of developing a new clot or stroke.  Get refills before you run out.  What do you do if you miss a dose? If you miss a dose, take it as soon as you remember on the same day then continue your regularly scheduled regimen the next day.  Do not take two doses of Coumadin at the same time.  Important Safety Information A possible side effect of Coumadin (Warfarin) is an increased risk of bleeding. You should call your healthcare provider right away if you experience any of the following:   Bleeding from an injury or your nose that does not stop.   Unusual colored urine (red or dark brown) or unusual colored stools (red or black).   Unusual bruising for unknown reasons.   A serious fall or if you hit your head (even if there is no bleeding).  Some foods or medicines interact with Coumadin (warfarin) and might alter your response to warfarin. To help avoid this:   Eat a balanced diet, maintaining a consistent amount of Vitamin K.   Notify your provider about major diet changes you plan to  make.   Avoid alcohol or limit your intake to 1 drink for women and 2 drinks for men per day. (1 drink is 5 oz. wine, 12 oz. beer, or 1.5 oz. liquor.)  Make sure that ANY health care provider who prescribes medication for you knows that you are taking Coumadin (warfarin).  Also make sure the healthcare provider who is monitoring your Coumadin knows when you have started a new medication including herbals and non-prescription products.  Coumadin (Warfarin)  Major Drug Interactions  Increased Warfarin Effect Decreased Warfarin Effect  Alcohol (large quantities) Antibiotics (esp.  Septra/Bactrim, Flagyl, Cipro) Amiodarone (Cordarone) Aspirin (ASA) Cimetidine (Tagamet) Megestrol (Megace) NSAIDs (ibuprofen, naproxen, etc.) Piroxicam (Feldene) Propafenone (Rythmol SR) Propranolol (Inderal) Isoniazid (INH) Posaconazole (Noxafil) Barbiturates (Phenobarbital) Carbamazepine (Tegretol) Chlordiazepoxide (Librium) Cholestyramine (Questran) Griseofulvin Oral Contraceptives Rifampin Sucralfate (Carafate) Vitamin K   Coumadin (Warfarin) Major Herbal Interactions  Increased Warfarin Effect Decreased Warfarin Effect  Garlic Ginseng Ginkgo biloba Coenzyme Q10 Green tea St. Johns wort    Coumadin (Warfarin) FOOD Interactions  Eat a consistent number of servings per week of foods HIGH in Vitamin K (1 serving =  cup)  Collards (cooked, or boiled & drained) Kale (cooked, or boiled & drained) Mustard greens (cooked, or boiled & drained) Parsley *serving size only =  cup Spinach (cooked, or boiled & drained) Swiss chard (cooked, or boiled & drained) Turnip greens (cooked, or boiled & drained)  Eat a consistent number of servings per week of foods MEDIUM-HIGH in Vitamin K (1 serving = 1 cup)  Asparagus (cooked, or boiled & drained) Broccoli (cooked, boiled & drained, or raw & chopped) Brussel sprouts (cooked, or boiled & drained) *serving size only =  cup Lettuce, raw (green leaf, endive, romaine) Spinach, raw Turnip greens, raw & chopped   These websites have more information on Coumadin (warfarin):  http://www.king-russell.com/; https://www.hines.net/;   Heart Failure Heart failure is a condition in which the heart has trouble pumping blood. This means your heart does not pump blood efficiently for your body to work well. In some cases of heart failure, fluid may back up into your lungs or you may have swelling (edema) in your lower legs. Heart failure is usually a long-term (chronic) condition. It is important for you to take good care of yourself and  follow your caregiver's treatment plan. CAUSES  Some health conditions can cause heart failure. Those health conditions include:  High blood pressure (hypertension) causes the heart muscle to work harder than normal. When pressure in the blood vessels is high, the heart needs to pump (contract) with more force in order to circulate blood throughout the body. High blood pressure eventually causes the heart to become stiff and weak.  Coronary artery disease (CAD) is the buildup of cholesterol and fat (plaque) in the arteries of the heart. The blockage in the arteries deprives the heart muscle of oxygen and blood. This can cause chest pain and may lead to a heart attack. High blood pressure can also contribute to CAD.  Heart attack (myocardial infarction) occurs when 1 or more arteries in the heart become blocked. The loss of oxygen damages the muscle tissue of the heart. When this happens, part of the heart muscle dies. The injured tissue does not contract as well and weakens the heart's ability to pump blood.  Abnormal heart valves can cause heart failure when the heart valves do not open and close properly. This makes the heart muscle pump harder to keep the blood flowing.  Heart muscle disease (  cardiomyopathy or myocarditis) is damage to the heart muscle from a variety of causes. These can include drug or alcohol abuse, infections, or unknown reasons. These can increase the risk of heart failure.  Lung disease makes the heart work harder because the lungs do not work properly. This can cause a strain on the heart, leading it to fail.  Diabetes increases the risk of heart failure. High blood sugar contributes to high fat (lipid) levels in the blood. Diabetes can also cause slow damage to tiny blood vessels that carry important nutrients to the heart muscle. When the heart does not get enough oxygen and food, it can cause the heart to become weak and stiff. This leads to a heart that does not contract  efficiently.  Other conditions can contribute to heart failure. These include abnormal heart rhythms, thyroid problems, and low blood counts (anemia). Certain unhealthy behaviors can increase the risk of heart failure. Those unhealthy behaviors include:  Being overweight.  Smoking or chewing tobacco.  Eating foods high in fat and cholesterol.  Abusing illicit drugs or alcohol.  Lacking physical activity. SYMPTOMS  Heart failure symptoms may vary and can be hard to detect. Symptoms may include:  Shortness of breath with activity, such as climbing stairs.  Persistent cough.  Swelling of the feet, ankles, legs, or abdomen.  Unexplained weight gain.  Difficulty breathing when lying flat (orthopnea).  Waking from sleep because of the need to sit up and get more air.  Rapid heartbeat.  Fatigue and loss of energy.  Feeling lightheaded, dizzy, or close to fainting.  Loss of appetite.  Nausea.  Increased urination during the night (nocturia). DIAGNOSIS  A diagnosis of heart failure is based on your history, symptoms, physical examination, and diagnostic tests. Diagnostic tests for heart failure may include:  Echocardiography.  Electrocardiography.  Chest X-ray.  Blood tests.  Exercise stress test.  Cardiac angiography.  Radionuclide scans. TREATMENT  Treatment is aimed at managing the symptoms of heart failure. Medicines, behavioral changes, or surgical intervention may be necessary to treat heart failure.  Medicines to help treat heart failure may include:  Angiotensin-converting enzyme (ACE) inhibitors. This type of medicine blocks the effects of a blood protein called angiotensin-converting enzyme. ACE inhibitors relax (dilate) the blood vessels and help lower blood pressure.  Angiotensin receptor blockers. This type of medicine blocks the actions of a blood protein called angiotensin. Angiotensin receptor blockers dilate the blood vessels and help lower  blood pressure.  Water pills (diuretics). Diuretics cause the kidneys to remove salt and water from the blood. The extra fluid is removed through urination. This loss of extra fluid lowers the volume of blood the heart pumps.  Beta blockers. These prevent the heart from beating too fast and improve heart muscle strength.  Digitalis. This increases the force of the heartbeat.  Healthy behavior changes include:  Obtaining and maintaining a healthy weight.  Stopping smoking or chewing tobacco.  Eating heart healthy foods.  Limiting or avoiding alcohol.  Stopping illicit drug use.  Physical activity as directed by your caregiver.  Surgical treatment for heart failure may include:  A procedure to open blocked arteries, repair damaged heart valves, or remove damaged heart muscle tissue.  A pacemaker to improve heart muscle function and control certain abnormal heart rhythms.  An internal cardioverter defibrillator to treat certain serious abnormal heart rhythms.  A left ventricular assist device to assist the pumping ability of the heart. HOME CARE INSTRUCTIONS   Take your medicine as directed  by your caregiver. Medicines are important in reducing the workload of your heart, slowing the progression of heart failure, and improving your symptoms.  Do not stop taking your medicine unless directed by your caregiver.  Do not skip any dose of medicine.  Refill your prescriptions before you run out of medicine. Your medicines are needed every day.  Take over-the-counter medicine only as directed by your caregiver or pharmacist.  Engage in moderate physical activity if directed by your caregiver. Moderate physical activity can benefit some people. The elderly and people with severe heart failure should consult with a caregiver for physical activity recommendations.  Eat heart healthy foods. Food choices should be free of trans fat and low in saturated fat, cholesterol, and salt  (sodium). Healthy choices include fresh or frozen fruits and vegetables, fish, lean meats, legumes, fat-free or low-fat dairy products, and whole grain or high fiber foods. Talk to a dietitian to learn more about heart healthy foods.  Limit sodium if directed by your caregiver. Sodium restriction may reduce symptoms of heart failure in some people. Talk to a dietitian to learn more about heart healthy seasonings.  Use healthy cooking methods. Healthy cooking methods include roasting, grilling, broiling, baking, poaching, steaming, or stir-frying. Talk to a dietitian to learn more about healthy cooking methods.  Limit fluids if directed by your caregiver. Fluid restriction may reduce symptoms of heart failure in some people.  Weigh yourself every day. Daily weights are important in the early recognition of excess fluid. You should weigh yourself every morning after you urinate and before you eat breakfast. Wear the same amount of clothing each time you weigh yourself. Record your daily weight. Provide your caregiver with your weight record.  Monitor and record your blood pressure if directed by your caregiver.  Check your pulse if directed by your caregiver.  Lose weight if directed by your caregiver. Weight loss may reduce symptoms of heart failure in some people.  Stop smoking or chewing tobacco. Nicotine makes your heart work harder by causing your blood vessels to constrict. Do not use nicotine gum or patches before talking to your caregiver.  Schedule and attend follow-up visits as directed by your caregiver. It is important to keep all your appointments.  Limit alcohol intake to no more than 1 drink per day for nonpregnant women and 2 drinks per day for men. Drinking more than that is harmful to your heart. Tell your caregiver if you drink alcohol several times a week. Talk with your caregiver about whether alcohol is safe for you. If your heart has already been damaged by alcohol or you  have severe heart failure, drinking alcohol should be stopped completely.  Stop illicit drug use.  Stay up-to-date with immunizations. It is especially important to prevent respiratory infections through current pneumococcal and influenza immunizations.  Manage other health conditions such as hypertension, diabetes, thyroid disease, or abnormal heart rhythms as directed by your caregiver.  Learn to manage stress.  Plan rest periods when fatigued.  Learn strategies to manage high temperatures. If the weather is extremely hot:  Avoid vigorous physical activity.  Use air conditioning or fans or seek a cooler location.  Avoid caffeine and alcohol.  Wear loose-fitting, lightweight, and light-colored clothing.  Learn strategies to manage cold temperatures. If the weather is extremely cold:  Avoid vigorous physical activity.  Layer clothes.  Wear mittens or gloves, a hat, and a scarf when going outside.  Avoid alcohol.  Obtain ongoing education and support as needed.  Participate or seek rehabilitation as needed to maintain or improve independence and quality of life. SEEK MEDICAL CARE IF:   Your weight increases by 03 lb/1.4 kg in 1 day or 05 lb/2.3 kg in a week.  You have increasing shortness of breath that is unusual for you.  You are unable to participate in your usual physical activities.  You tire easily.  You cough more than normal, especially with physical activity.  You have any or more swelling in areas such as your hands, feet, ankles, or abdomen.  You are unable to sleep because it is hard to breathe.  You feel like your heart is beating fast (palpitations).  You become dizzy or lightheaded upon standing up. SEEK IMMEDIATE MEDICAL CARE IF:   You have difficulty breathing.  There is a change in mental status such as decreased alertness or difficulty with concentration.  You have a pain or discomfort in your chest.  You have an episode of fainting  (syncope). MAKE SURE YOU:   Understand these instructions.  Will watch your condition.  Will get help right away if you are not doing well or get worse. Document Released: 07/02/2005 Document Revised: 10/27/2012 Document Reviewed: 07/24/2012 [facility] Patient Information 2014 [place], [place].

## 2013-09-11 NOTE — Progress Notes (Signed)
Physical Therapy Treatment Patient Details Name: Gregg [name] MRN: [id number] DOB: [birth year] Today's Date: 09/11/2013 Time: 6378-5885 PT Time Calculation (min): 30 min  PT Assessment / Plan / Recommendation  History of Present Illness Gregg [name] is a 73 y.o. 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 Admitted with respiratory failure thought primarily due to PNA with component of HF   PT Comments   Pt with excellent progress today with gait and oxygen saturation. Pt able to maintain 90-96% on 6L throughout gait and 97% at rest on 4L. Pt states he doesn't feel wife can assist or supervise at home due to her "weight problem". Pt with decreased cognition and mobility and would benefit from ST-SNF to return to mod I. Will continue to follow.   Follow Up Recommendations  Supervision/Assistance - 24 hour;SNF     Does the patient have the potential to tolerate intense rehabilitation     Barriers to Discharge        Equipment Recommendations  Rolling walker with 5" wheels    Recommendations for Other Services    Frequency Min 3X/week   Progress towards PT Goals Progress towards PT goals: Progressing toward goals  Plan Discharge plan needs to be updated    Precautions / Restrictions Precautions Precautions: Fall Restrictions Weight Bearing Restrictions: No   Pertinent Vitals/Pain HR 77 BP 109/50 No pain    Mobility  Bed Mobility Overal bed mobility: Modified Independent General bed mobility comments: with rail  Transfers Overall transfer level: Needs assistance Transfers: Sit to/from Stand Sit to Stand: Supervision General transfer comment: cueing for hand placement  Ambulation/Gait Ambulation/Gait assistance: Supervision Ambulation Distance (Feet): 300 Feet Assistive device: Rolling walker (2 wheeled) Gait Pattern/deviations: Step-through  pattern;Decreased stride length Gait velocity interpretation: Below normal speed for age/gender General Gait Details: pt with good posture today and needed directional cues as well as cues to look up and step into RW Stairs: Yes Stairs assistance: Supervision Stair Management: One rail Right;Alternating pattern;Forwards Number of Stairs: 4    Exercises     PT Diagnosis:    PT Problem List:   PT Treatment Interventions:     PT Goals (current goals can now be found in the care plan section)    Visit Information  Last PT Received On: 09/11/13 Assistance Needed: +1 History of Present Illness: Gregg [name] is a 73 y.o. 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 Admitted with respiratory failure thought primarily due to PNA with component of HF    Subjective Data      Cognition  Cognition Arousal/Alertness: Awake/alert Behavior During Therapy: WFL for tasks assessed/performed Overall Cognitive Status: Impaired/Different from baseline Area of Impairment: Problem solving Memory: Decreased short-term memory Safety/Judgement: Decreased awareness of deficits Problem Solving: Slow processing General Comments: pt continues to have difficulty with problem solving and direct answers related to home setup    Balance     End of Session PT - End of Session Equipment Utilized During Treatment: Gait belt;Oxygen Activity Tolerance: Patient tolerated treatment well Patient left: in chair;with call bell/phone within reach Nurse Communication: Mobility status   GP     Toney Sang [facility] 09/11/2013, 10:14 AM Delaney Meigs, PT [phone]

## 2013-09-11 NOTE — Progress Notes (Addendum)
CSW called and left voicemails with [facility] and [facility] asking for a call back about bed availability. Pt has a bed offer at [facility], but has not received a PASARR because pt is a [place] resident. CSW waiting for fax number/message from nurses analyzing PASARR request to explain pt case and follow up concerning PASARR number.  Addendum: [facility] cannot take pt because they cannot take milrinone. CSW called PASARR help desk and asked if there is any way CSW can be informed of who is reviewing clinicals so CSW can send them any information they need. PASARR states CSW will need to upload FL2, H&P, and 30 day note and that PASARR will expedite this case for CSW. CSW paged MD now to get 30 day note signed. CSW getting 30 day note now and will either meet MD in hospital to have this signed or MD will come to unit to sign note.  Maryclare Labrador, MSW, [facility] Clinical Social Worker [phone]

## 2013-09-11 NOTE — Progress Notes (Signed)
Clinical Social Work Department CLINICAL SOCIAL WORK PLACEMENT NOTE 09/11/2013  Patient:  Gregg [name], Gregg [name]  Account Number:  [id number] Admit date:  09/02/2013  Clinical Social Worker:  Maryclare Labrador, Theresia Majors  Date/time:  09/11/2013 01:02 PM  Clinical Social Work is seeking post-discharge placement for this patient at the following level of care:   SKILLED NURSING   (*CSW will update this form in Epic as items are completed)   N/A-clinicals sent to all SNFs in [facility] and SNFs requested by family in [place], [place]  Patient/family provided with [facility] Department of Clinical Social Work's list of facilities offering this level of care within the geographic area requested by the patient (or if unable, by the patient's family).  09/11/2013  Patient/family informed of their freedom to choose among providers that offer the needed level of care, that participate in Medicare, Medicaid or managed care program needed by the patient, have an available bed and are willing to accept the patient.  N/A-clinicals not sent to this SNF  Patient/family informed of [facility]' ownership interest in [facility], as well as of the fact that they are under no obligation to receive care at this facility.  PASARR submitted to EDS on 09/11/2013 PASARR number received from EDS on  FL2 transmitted to all facilities in geographic area requested by pt/family on  09/11/2013 FL2 transmitted to all facilities within larger geographic area on 09/11/2013  Patient informed that his/her managed care company has contracts with or will negotiate with  certain facilities, including the following:     Patient/family informed of bed offers received:   Patient chooses bed at  Physician recommends and patient chooses bed at    Patient to be transferred to  on   Patient to be transferred to facility by   The following physician request were entered in Epic:   Additional Comments:   Maryclare Labrador, MSW,  [place] Clinical Social Worker [phone]

## 2013-09-12 DIAGNOSIS — I5022 Chronic systolic (congestive) heart failure: Secondary | ICD-10-CM

## 2013-09-12 LAB — BASIC METABOLIC PANEL
BUN: 30 mg/dL — ABNORMAL HIGH (ref 6–23)
CO2: 35 mEq/L — ABNORMAL HIGH (ref 19–32)
Calcium: 10.4 mg/dL (ref 8.4–10.5)
Chloride: 101 mEq/L (ref 96–112)
Creatinine, Ser: 1.09 mg/dL (ref 0.50–1.35)
GFR calc non Af Amer: 66 mL/min — ABNORMAL LOW (ref >90)
GFR, EST AFRICAN AMERICAN: 77 mL/min — AB (ref >90)
GLUCOSE: 99 mg/dL (ref 70–99)
POTASSIUM: 3.8 meq/L (ref 3.7–5.3)
SODIUM: 142 meq/L (ref 137–147)

## 2013-09-12 LAB — CBC
HCT: 31.6 % — ABNORMAL LOW (ref 39.0–52.0)
HEMOGLOBIN: 10.3 g/dL — AB (ref 13.0–17.0)
MCH: 30.5 pg (ref 26.0–34.0)
MCHC: 32.6 g/dL (ref 30.0–36.0)
MCV: 93.5 fL (ref 78.0–100.0)
PLATELETS: 551 10*3/uL — AB (ref 150–400)
RBC: 3.38 MIL/uL — AB (ref 4.22–5.81)
RDW: 15.4 % (ref 11.5–15.5)
WBC: 10.3 10*3/uL (ref 4.0–10.5)

## 2013-09-12 LAB — PROTIME-INR
INR: 2.43 — ABNORMAL HIGH (ref 0.00–1.49)
Prothrombin Time: 25.6 seconds — ABNORMAL HIGH (ref 11.6–15.2)

## 2013-09-12 MED ORDER — WARFARIN SODIUM 2.5 MG PO TABS
2.5000 mg | ORAL_TABLET | Freq: Once | ORAL | Status: AC
  Administered 2013-09-12: 2.5 mg via ORAL
  Filled 2013-09-12 (×2): qty 1

## 2013-09-12 NOTE — Progress Notes (Signed)
Subjective: Pt was surprised to still be admitted as all pprwrk and transfer was thought to be complete for his acceptance at [facility]. Otherwise pt doing same w/o new complaints.   Objective: Vital signs in last 24 hours: Filed Vitals:   09/11/13 2020 09/12/13 0000 09/12/13 0400 09/12/13 0545  BP: 92/49 102/58 108/61   Pulse: 70 70 70 70  Temp: 97.9 F (36.6 C) 97.8 F (36.6 C)    TempSrc: Oral Oral    Resp: 19  17 20   Height:    5\' 10"  (1.778 m)  Weight:    174 lb 2.6 oz (79 kg)  SpO2: 99%  98% 100%   Weight change: 3 lb 8.4 oz (1.6 kg)  Intake/Output Summary (Last 24 hours) at 09/12/13 0735 Last data filed at 09/12/13 0700  Gross per 24 hour  Intake  746.4 ml  Output   1700 ml  Net -953.6 ml   Physical Exam: BP 108/61  Pulse 70  Temp(Src) 97.8 F (36.6 C) (Oral)  Resp 20  Ht 5\' 10"  (1.778 m)  Wt 174 lb 2.6 oz (79 kg)  BMI 24.99 kg/m2  SpO2 100% General: resting in bed  HEENT: PERRL, EOMI, no scleral icterus Cardiac: RRR, no rubs, murmurs or gallops Pulm: clear to auscultation bilaterally, moving normal volumes of air Abd: soft, nontender, nondistended, BS present Ext: warm and well perfused, no pedal edema, foley in place Neuro: alert and oriented X3, cranial nerves II-XII grossly intact  Lab Results: BMET    Component Value Date/Time   NA 142 09/12/2013 0500   K 3.8 09/12/2013 0500   CL 101 09/12/2013 0500   CO2 35* 09/12/2013 0500   GLUCOSE 99 09/12/2013 0500   BUN 30* 09/12/2013 0500   CREATININE 1.09 09/12/2013 0500   CALCIUM 10.4 09/12/2013 0500   GFRNONAA 66* 09/12/2013 0500   GFRAA 77* 09/12/2013 0500   Lab Results  Component Value Date   INR 2.43* 09/12/2013   INR 2.87* 09/11/2013   INR 3.28* 09/10/2013    Medications: I have reviewed the patient's current medications. Scheduled Meds: . amiodarone  200 mg Oral Daily  . antiseptic oral rinse  15 mL Mouth Rinse q12n4p  . aspirin EC  81 mg Oral Daily  . benzonatate  200 mg Oral TID  .  chlorhexidine  15 mL Mouth Rinse BID  . ferrous fumarate-b12-vitamic C-folic acid  1 capsule Oral Q lunch  . furosemide  40 mg Oral BID  . guaiFENesin  600 mg Oral BID  . loratadine  10 mg Oral Daily  . magnesium oxide  1,000 mg Oral BID  . pantoprazole  40 mg Oral Daily  . potassium chloride  20 mEq Oral BID  . pyridOXINE  100 mg Oral Q lunch  . sildenafil  20 mg Oral TID  . sodium chloride  3 mL Intravenous Q12H  . warfarin  1 each Does not apply Once  . Warfarin - Pharmacist Dosing Inpatient   Does not apply q1800   Continuous Infusions: . milrinone 0.25 mcg/kg/min (09/11/13 2238)   PRN Meds:.albuterol, alum & mag hydroxide-simeth, promethazine Assessment/Plan: Gregg [name] is a 73 yo AA man with history of end stage CHF (EF 10-15%) with ICD and IV milrinone not a candidate for heart transplant, Afib both anticoagulated and rate controlled, OSA, allergic rhinitis, and GERD who presents with acute CHF with possible underlying CAP that has resolved.   # chronic systolic CHF: Improving. Patient has basline NYHA Class IV Systolic  CHF with non-ischemic CM treated with IV milrinon and digoxin at home, and he has ICD placed. He is not a transplant candidate. Wt 185>>>170 lbs today.   Plan:  -Continue furosemide PO 40mg  BID  -Can consider restarting digoxin per HF recs -Continuous milrinone per pharmacy  -Sidenalfil 20mg  TID  -Aspirin 81mg   -Hold ACEi and carvedilol due to low BP -Daily weights   # CAP resolved: Patient presented with severe cough with new infiltrates on admission CXR showing pulm edema vs. PNA. Blood culture NGTD. Patient completed 6 days of antibiotics as listed below and continues on 6L oxygen.   Antibiotics:  Azithro 2/19 >>> 2/20  Ceftriaxone 2/19 >>> 2/20  Cefepime 2/20 >>> 2/24  Vancomycin 2/20 >>> 2/22  Plan:  -Incentive spirometry  -Albuterol Nebs q6H  -Mucinex 600mg  BID and tessalon 200 TID prn   -Loratadine 10mg  for allergies  -Outpaitent PFT (09/14/13)  to assess for obstructive/restrictive disease   # Atrial Fibrillation: Currently on amiodarone 200mg  daily and warfarin therapy. INR is now therapeutic at 2.87, and no signs of bleeding.   # CKD stage 2: current Cr is 1.13 with baseline at 1.3. AKI is resolved.  # Normocytic anemia: Hgb stable at 11.1 from 12.2. Likely hemodilution from acute CHF. Pt with history of since 1 month ago. Etiology most likely due to CKD.  -Consider anemia panel  -Monitor for bleeding  -Monitor CBC   # Obstructive Sleep Apnea: Patient reports compliance with CPAP at home. Currently on 6L [place] with CPAP at night   # Allergic Rhinitis - cont home Loratadine 10 mg daily   # GERD - currently without reflux symptoms. Cont Protonix 40 mg daily   # Diet: Heart healthy  # DVT Ppx:  SCD's and pt on warfarin for Afib # Code: DNR, DNI, POA is wife   Dispo: patient had been accepted to [facility] and having some insurance disputes halting d/c.   Gregg [name], Gregg [name] 09/12/2013, 7:35 AM Pgr: [phone]

## 2013-09-12 NOTE — Progress Notes (Addendum)
Pt ambulated the hallway. He walked the entire length of the unit including the external main hall and the hall leading back to this unit towards the 2H unit side and back to his room. He was assisted and accompanied by this RN, the NT, and his wife. He was assisted by a front wheel walker, O2 tank, and a chair if he became fatigued. Not sure of the exact measurement in feet, but I hope this note can provide somewhat of a picture. His O2 sats did not decrease at 3L , his heart rate however, went as high as 106 bpm from 70 bpm. He denied SOB, dizziness, chest pain, general pain, and fatigue. He finished his walk and sat back in his chair.

## 2013-09-12 NOTE — Progress Notes (Addendum)
Patient ID: Gregg [name], male   DOB: 10/23/1940, 73 y.o.   MRN: [id number]     Subjective:   No events overnight   Objective:   Temp:  [97.2 F (36.2 C)-98.4 F (36.9 C)] 98.4 F (36.9 C) (02/28 0751) Pulse Rate:  [70-73] 71 (02/28 0753) Resp:  [14-24] 22 (02/28 0753) BP: (92-111)/(49-64) 111/64 mmHg (02/28 0753) SpO2:  [96 %-100 %] 100 % (02/28 0753) Weight:  [174 lb 2.6 oz (79 kg)] 174 lb 2.6 oz (79 kg) (02/28 0545) Last BM Date: 09/11/13  Filed Weights   09/10/13 0400 09/11/13 0400 09/12/13 0545  Weight: 172 lb 6.4 oz (78.2 kg) 170 lb 10.2 oz (77.4 kg) 174 lb 2.6 oz (79 kg)    Intake/Output Summary (Last 24 hours) at 09/12/13 0840 Last data filed at 09/12/13 0800  Gross per 24 hour  Intake  752.5 ml  Output   1700 ml  Net -947.5 ml    Exam:  General: NAD  Resp:faint crackles bilateral bases  Cardiac:RRR,   WU:JWJXBJYGI:abdomen soft, NT, ND  MSK:LE warm, no edema    Lab Results:  Basic Metabolic Panel:  Recent Labs Lab 09/05/13 1255  09/06/13 1003  09/09/13 0605 09/11/13 0440 09/12/13 0500  NA  --   < >  --   < > 142 142 142  K  --   < >  --   < > 3.8 3.4* 3.8  CL  --   < >  --   < > 100 104 101  CO2  --   < >  --   < > 31 35* 35*  GLUCOSE  --   < >  --   < > 94 90 99  BUN  --   < >  --   < > 75* 39* 30*  CREATININE  --   < >  --   < > 1.42* 1.13 1.09  CALCIUM  --   < >  --   < > 11.5* 10.9* 10.4  MG 2.3  --  2.7*  --   --   --   --   < > = values in this interval not displayed.  Liver Function Tests: No results found for this basename: AST, ALT, ALKPHOS, BILITOT, PROT, ALBUMIN,  in the last 168 hours  CBC:  Recent Labs Lab 09/06/13 0431 09/07/13 0237  WBC 16.3* 13.5*  HGB 11.1* 10.4*  HCT 33.0* 30.3*  MCV 90.9 90.2  PLT 185 225    Cardiac Enzymes: No results found for this basename: CKTOTAL, CKMB, CKMBINDEX, TROPONINI,  in the last 168 hours  BNP:  Recent Labs  07/21/13 1159 09/02/13 1409  PROBNP 1898.0* 5151.0*     Coagulation:  Recent Labs Lab 09/10/13 0410 09/11/13 0440 09/12/13 0500  INR 3.28* 2.87* 2.43*    ECG:   Medications:   Scheduled Medications: . amiodarone  200 mg Oral Daily  . antiseptic oral rinse  15 mL Mouth Rinse q12n4p  . aspirin EC  81 mg Oral Daily  . benzonatate  200 mg Oral TID  . chlorhexidine  15 mL Mouth Rinse BID  . ferrous fumarate-b12-vitamic C-folic acid  1 capsule Oral Q lunch  . furosemide  40 mg Oral BID  . guaiFENesin  600 mg Oral BID  . loratadine  10 mg Oral Daily  . magnesium oxide  1,000 mg Oral BID  . pantoprazole  40 mg Oral Daily  . potassium chloride  20 mEq Oral BID  .  pyridOXINE  100 mg Oral Q lunch  . sildenafil  20 mg Oral TID  . sodium chloride  3 mL Intravenous Q12H  . warfarin  1 each Does not apply Once  . Warfarin - Pharmacist Dosing Inpatient   Does not apply q1800     Infusions: . milrinone 0.25 mcg/kg/min (09/11/13 2238)     PRN Medications:  albuterol, alum & mag hydroxide-simeth, promethazine     Assessment/Plan   73 yo male with NICM LVEF 10-15%, hx of VT s/p ICD, afib on coumadin, and CKD not candidate for advanced therapies per CHF team based on neurocognitive testing admitted with pneumonia and volume overload.   1. Acute on chronic systolic heart failure - on home inotropes 0.25, which is his home dose - net negative1 liter yesterday,  6.2 liters since admission, Cr remains stable. - off beta blocker and ARB, per CHF notes plans to consider restarting as outpatient. Continue current meds - he is awaiting SNF placement  2. Afib - on amio and coumadin, INR is therapeutic - telemetry shows normal rates, V-paced.        Dina Rich, M.D., F.A.C.C.

## 2013-09-12 NOTE — Progress Notes (Signed)
ANTICOAGULATION CONSULT NOTE - Follow Up Consult  Pharmacy Consult for warfarin Indication: atrial fibrillation  No Known Allergies  Patient Measurements: Height: 5\' 10"  (177.8 cm) Weight: 174 lb 2.6 oz (79 kg) IBW/kg (Calculated) : 73   Vital Signs: Temp: 97.7 F (36.5 C) (02/28 1157) Temp src: Oral (02/28 1157) BP: 98/56 mmHg (02/28 1157) Pulse Rate: 70 (02/28 1157)  Labs:  Recent Labs  09/10/13 0410 09/11/13 0440 09/12/13 0500  LABPROT 32.2* 29.1* 25.6*  INR 3.28* 2.87* 2.43*  CREATININE  --  1.13 1.09    Estimated Creatinine Clearance: 64.2 ml/min (by C-G formula based on Cr of 1.09).   Medications:  Scheduled:  . amiodarone  200 mg Oral Daily  . antiseptic oral rinse  15 mL Mouth Rinse q12n4p  . aspirin EC  81 mg Oral Daily  . benzonatate  200 mg Oral TID  . chlorhexidine  15 mL Mouth Rinse BID  . ferrous fumarate-b12-vitamic C-folic acid  1 capsule Oral Q lunch  . furosemide  40 mg Oral BID  . guaiFENesin  600 mg Oral BID  . loratadine  10 mg Oral Daily  . magnesium oxide  1,000 mg Oral BID  . pantoprazole  40 mg Oral Daily  . potassium chloride  20 mEq Oral BID  . pyridOXINE  100 mg Oral Q lunch  . sildenafil  20 mg Oral TID  . sodium chloride  3 mL Intravenous Q12H  . warfarin  1 each Does not apply Once  . Warfarin - Pharmacist Dosing Inpatient   Does not apply q1800    Assessment: 35 YOM on warfarin PTA for AFib admitted 09/02/2013.  Initially restarted patients home dose of warfarin, but patient quickly rose in INR. Stopped x6d. Restarted on 2/27 at low dose. INR trending down but therapeutic 2.87>2.43.  Hgb slight trend down and plts have remained stable (last CBC 2/23), no bleeding noted. Patient is on amiodarone, however this is a home medication which should not be acutely affecting INR.  Goal of therapy: INR 2-3  Plan: - Resume Coumadin with 2.5 mg x 1 tonight. - Continue daily INR. - Recheck CBC today  Prakriti Carignan M. Allena Katz, PharmD Clinical  Pharmacist- Resident Pager: [phone] Pharmacy: [phone] 09/12/2013 12:33 PM

## 2013-09-13 LAB — BASIC METABOLIC PANEL
BUN: 23 mg/dL (ref 6–23)
CHLORIDE: 103 meq/L (ref 96–112)
CO2: 33 mEq/L — ABNORMAL HIGH (ref 19–32)
Calcium: 10.5 mg/dL (ref 8.4–10.5)
Creatinine, Ser: 1.12 mg/dL (ref 0.50–1.35)
GFR calc non Af Amer: 64 mL/min — ABNORMAL LOW (ref >90)
GFR, EST AFRICAN AMERICAN: 74 mL/min — AB (ref >90)
Glucose, Bld: 89 mg/dL (ref 70–99)
POTASSIUM: 4.3 meq/L (ref 3.7–5.3)
SODIUM: 143 meq/L (ref 137–147)

## 2013-09-13 LAB — PROTIME-INR
INR: 2.26 — AB (ref 0.00–1.49)
PROTHROMBIN TIME: 24.2 s — AB (ref 11.6–15.2)

## 2013-09-13 LAB — MAGNESIUM: MAGNESIUM: 1.3 mg/dL — AB (ref 1.5–2.5)

## 2013-09-13 MED ORDER — WARFARIN SODIUM 5 MG PO TABS
5.0000 mg | ORAL_TABLET | Freq: Once | ORAL | Status: AC
  Administered 2013-09-13: 5 mg via ORAL
  Filled 2013-09-13: qty 1

## 2013-09-13 MED ORDER — DEXTROSE 5 % IV SOLN
3.0000 g | Freq: Once | INTRAVENOUS | Status: AC
  Administered 2013-09-13: 3 g via INTRAVENOUS
  Filled 2013-09-13 (×2): qty 6

## 2013-09-13 NOTE — Progress Notes (Signed)
Subjective: Pt ambulated with 3L [place] and self bathed yesterday. Waiting placement either home or SNF.    Objective: Vital signs in last 24 hours: Filed Vitals:   09/12/13 2104 09/13/13 0130 09/13/13 0454 09/13/13 0740  BP: 119/76 100/69 103/56 98/58  Pulse: 78 71 70 70  Temp: 97.9 F (36.6 C) 98 F (36.7 C) 97.9 F (36.6 C) 98 F (36.7 C)  TempSrc: Oral Oral Axillary Oral  Resp: 10 21 18 18   Height:      Weight:   174 lb 13.2 oz (79.3 kg)   SpO2: 99% 99% 99% 99%   Weight change: 10.6 oz (0.3 kg)  Intake/Output Summary (Last 24 hours) at 09/13/13 0907 Last data filed at 09/13/13 0200  Gross per 24 hour  Intake  352.8 ml  Output    950 ml  Net -597.2 ml   Physical Exam: BP 98/58  Pulse 70  Temp(Src) 98 F (36.7 C) (Oral)  Resp 18  Ht 5\' 10"  (1.778 m)  Wt 174 lb 13.2 oz (79.3 kg)  BMI 25.08 kg/m2  SpO2 99% General: resting in bed  HEENT: PERRL, EOMI, no scleral icterus Cardiac: RRR, no rubs, murmurs or gallops Pulm: clear to auscultation bilaterally, moving normal volumes of air Abd: soft, nontender, nondistended, BS present Ext: warm and well perfused, no pedal edema, foley in place Neuro: alert and oriented X3, cranial nerves II-XII grossly intact  Lab Results: BMET    Component Value Date/Time   NA 143 09/13/2013 0500   K 4.3 09/13/2013 0500   CL 103 09/13/2013 0500   CO2 33* 09/13/2013 0500   GLUCOSE 89 09/13/2013 0500   BUN 23 09/13/2013 0500   CREATININE 1.12 09/13/2013 0500   CALCIUM 10.5 09/13/2013 0500   GFRNONAA 64* 09/13/2013 0500   GFRAA 74* 09/13/2013 0500   Lab Results  Component Value Date   INR 2.26* 09/13/2013   INR 2.43* 09/12/2013   INR 2.87* 09/11/2013    Medications: I have reviewed the patient's current medications. Scheduled Meds: . amiodarone  200 mg Oral Daily  . antiseptic oral rinse  15 mL Mouth Rinse q12n4p  . aspirin EC  81 mg Oral Daily  . benzonatate  200 mg Oral TID  . chlorhexidine  15 mL Mouth Rinse BID  . ferrous  fumarate-b12-vitamic C-folic acid  1 capsule Oral Q lunch  . furosemide  40 mg Oral BID  . guaiFENesin  600 mg Oral BID  . loratadine  10 mg Oral Daily  . magnesium oxide  1,000 mg Oral BID  . magnesium sulfate 1 - 4 g bolus IVPB  3 g Intravenous Once  . pantoprazole  40 mg Oral Daily  . potassium chloride  20 mEq Oral BID  . pyridOXINE  100 mg Oral Q lunch  . sildenafil  20 mg Oral TID  . sodium chloride  3 mL Intravenous Q12H  . warfarin  1 each Does not apply Once  . Warfarin - Pharmacist Dosing Inpatient   Does not apply q1800   Continuous Infusions: . milrinone 0.25 mcg/kg/min (09/13/13 0737)   PRN Meds:.albuterol, alum & mag hydroxide-simeth, promethazine Assessment/Plan: Gregg [name] is a 73 yo AA man with history of end stage CHF (EF 10-15%) with ICD and IV milrinone not a candidate for heart transplant, Afib both anticoagulated and rate controlled, OSA, allergic rhinitis, and GERD who presents with acute CHF with possible underlying CAP that has resolved.   # chronic systolic CHF: Improving. Patient has basline NYHA  Class IV Systolic CHF with non-ischemic CM treated with IV milrinon and digoxin at home, and he has ICD placed. He is not a transplant candidate. Wt 185>>>170 lbs today.   Plan:  -Continue furosemide PO 40mg  BID  -Can consider restarting digoxin per HF recs -Continuous milrinone per pharmacy  -Sidenalfil 20mg  TID  -Aspirin 81mg   -Hold ACEi and carvedilol due to low BP -Daily weights   # Atrial Fibrillation: Currently on amiodarone 200mg  daily and warfarin therapy. INR is now therapeutic at 2.87, and no signs of bleeding.   # CKD stage 2  # Obstructive Sleep Apnea: Patient reports compliance with CPAP at home. Currently on 6L [place] with CPAP at night   # Allergic Rhinitis  # GERD  # Diet: Heart healthy  # DVT Ppx:  SCD's and pt on warfarin for Afib # Code: DNR, DNI, POA is wife   Dispo: patient had been accepted to [facility] and having some insurance  disputes halting d/c.   Gregg [name], Gregg [name] 09/13/2013, 9:07 AM Pgr: [phone]

## 2013-09-13 NOTE — Progress Notes (Signed)
ANTICOAGULATION CONSULT NOTE - Follow Up Consult  Pharmacy Consult for warfarin Indication: atrial fibrillation  No Known Allergies  Patient Measurements: Height: 5\' 10"  (177.8 cm) Weight: 174 lb 13.2 oz (79.3 kg) IBW/kg (Calculated) : 73   Vital Signs: Temp: 98 F (36.7 C) (03/01 0740) Temp src: Oral (03/01 0740) BP: 98/58 mmHg (03/01 0740) Pulse Rate: 70 (03/01 0740)  Labs:  Recent Labs  09/11/13 0440 09/12/13 0500 09/12/13 1300 09/13/13 0500  HGB  --   --  10.3*  --   HCT  --   --  31.6*  --   PLT  --   --  551*  --   LABPROT 29.1* 25.6*  --  24.2*  INR 2.87* 2.43*  --  2.26*  CREATININE 1.13 1.09  --  1.12    Estimated Creatinine Clearance: 62.5 ml/min (by C-G formula based on Cr of 1.12).   Medications:  Scheduled:  . amiodarone  200 mg Oral Daily  . antiseptic oral rinse  15 mL Mouth Rinse q12n4p  . aspirin EC  81 mg Oral Daily  . benzonatate  200 mg Oral TID  . chlorhexidine  15 mL Mouth Rinse BID  . ferrous fumarate-b12-vitamic C-folic acid  1 capsule Oral Q lunch  . furosemide  40 mg Oral BID  . guaiFENesin  600 mg Oral BID  . loratadine  10 mg Oral Daily  . magnesium oxide  1,000 mg Oral BID  . magnesium sulfate 1 - 4 g bolus IVPB  3 g Intravenous Once  . pantoprazole  40 mg Oral Daily  . potassium chloride  20 mEq Oral BID  . pyridOXINE  100 mg Oral Q lunch  . sildenafil  20 mg Oral TID  . sodium chloride  3 mL Intravenous Q12H  . warfarin  1 each Does not apply Once  . Warfarin - Pharmacist Dosing Inpatient   Does not apply q1800    Assessment: 3 YOM on warfarin PTA for AFib admitted 09/02/2013 for acute on chronic heart failure.  Initially restarted patients home dose of warfarin, but patient quickly rose in INR. Stopped x6d. Restarted on 2/27 at low dose. INR continuing to trend down, in anticipation for further drop I will increase dose. Hgb stable and plt elevated. No signs of bleed noted.   Goal of therapy: INR 2-3  Plan: - Coumadin  5 mg x 1 tonight. - Continue daily INR. - Recheck CBC today  Litisha Guagliardo M. Allena Katz, PharmD Clinical Pharmacist- Resident Pager: [phone] Pharmacy: [phone] 09/13/2013 11:31 AM

## 2013-09-13 NOTE — Progress Notes (Signed)
Patient ID: Gregg [name], male   DOB: 01/08/1941, 73 y.o.   MRN: [id number]    Primary cardiologist:  Subjective:   Ambulated on 3L [place] yesterday without significant limitations   Objective:   Temp:  [97.7 F (36.5 C)-98.4 F (36.9 C)] 98 F (36.7 C) (03/01 0740) Pulse Rate:  [70-78] 70 (03/01 0740) Resp:  [10-22] 18 (03/01 0740) BP: (98-119)/(56-76) 98/58 mmHg (03/01 0740) SpO2:  [99 %-100 %] 99 % (03/01 0740) Weight:  [174 lb 13.2 oz (79.3 kg)] 174 lb 13.2 oz (79.3 kg) (03/01 0454) Last BM Date: 09/12/13  Filed Weights   09/11/13 0400 09/12/13 0545 09/13/13 0454  Weight: 170 lb 10.2 oz (77.4 kg) 174 lb 2.6 oz (79 kg) 174 lb 13.2 oz (79.3 kg)    Intake/Output Summary (Last 24 hours) at 09/13/13 0745 Last data filed at 09/13/13 0200  Gross per 24 hour  Intake  358.9 ml  Output    950 ml  Net -591.1 ml    Telemetry: V-paced  Exam:  General:NAD  Resp: clear anteriorally  Cardiac:RRR, 2/6 systolic murmur at apex  WU:[id number]:abdomen soft, NT, ND  MSK: Lower extremities warm, no edema  Neuro: no focal deficits    Lab Results:  Basic Metabolic Panel:  Recent Labs Lab 09/06/13 1003  09/11/13 0440 09/12/13 0500 09/13/13 0500  NA  --   < > 142 142 143  K  --   < > 3.4* 3.8 4.3  CL  --   < > 104 101 103  CO2  --   < > 35* 35* 33*  GLUCOSE  --   < > 90 99 89  BUN  --   < > 39* 30* 23  CREATININE  --   < > 1.13 1.09 1.12  CALCIUM  --   < > 10.9* 10.4 10.5  MG 2.7*  --   --   --  1.3*  < > = values in this interval not displayed.  Liver Function Tests: No results found for this basename: AST, ALT, ALKPHOS, BILITOT, PROT, ALBUMIN,  in the last 168 hours  CBC:  Recent Labs Lab 09/07/13 0237 09/12/13 1300  WBC 13.5* 10.3  HGB 10.4* 10.3*  HCT 30.3* 31.6*  MCV 90.2 93.5  PLT 225 551*    Cardiac Enzymes: No results found for this basename: CKTOTAL, CKMB, CKMBINDEX, TROPONINI,  in the last 168 hours  BNP:  Recent Labs  07/21/13 1159 09/02/13 1409    PROBNP 1898.0* 5151.0*    Coagulation:  Recent Labs Lab 09/11/13 0440 09/12/13 0500 09/13/13 0500  INR 2.87* 2.43* 2.26*    ECG:   Medications:   Scheduled Medications: . amiodarone  200 mg Oral Daily  . antiseptic oral rinse  15 mL Mouth Rinse q12n4p  . aspirin EC  81 mg Oral Daily  . benzonatate  200 mg Oral TID  . chlorhexidine  15 mL Mouth Rinse BID  . ferrous fumarate-b12-vitamic C-folic acid  1 capsule Oral Q lunch  . furosemide  40 mg Oral BID  . guaiFENesin  600 mg Oral BID  . loratadine  10 mg Oral Daily  . magnesium oxide  1,000 mg Oral BID  . pantoprazole  40 mg Oral Daily  . potassium chloride  20 mEq Oral BID  . pyridOXINE  100 mg Oral Q lunch  . sildenafil  20 mg Oral TID  . sodium chloride  3 mL Intravenous Q12H  . warfarin  1 each Does not apply Once  .  Warfarin - Pharmacist Dosing Inpatient   Does not apply q1800     Infusions: . milrinone 0.25 mcg/kg/min (09/13/13 0737)     PRN Medications:  albuterol, alum & mag hydroxide-simeth, promethazine     Assessment/Plan   73 yo male with NICM LVEF 10-15%, hx of VT s/p ICD, afib on coumadin, and CKD not candidate for advanced therapies per CHF team based on neurocognitive testing admitted with pneumonia and volume overload.   1. Acute on chronic systolic heart failure  - on home inotropes milrinone 0.25 - net negative 600 mLyesterday, 6.8 liters since admission, Cr remains stable.  - off beta blocker and ARB, per CHF notes plans to consider restarting as outpatient. Continue current meds  - he is awaiting SNF placement   2. Afib  - on amio and coumadin, INR is therapeutic  - telemetry shows normal rates, V-paced.          Dina Rich, M.D., F.A.C.C.

## 2013-09-13 NOTE — Progress Notes (Addendum)
Pt again ambulated the hallway. He walked the entire length of the unit including the external main hall and the hall leading back to this unit towards the 2H unit side and back to his room. He was accompanied by this RN, and the NT. He was assisted with a front wheel walker, O2 tank, and a chair if he became fatigued. His O2 sats did not decrease at 3L [place], his heart rate however, went as high as 105 bpm from 87 bpm. He denied SOB, dizziness, chest pain, general pain, and fatigue. He finished his walk and and got back in bed.   He maintains an oxygen saturation of 100% on 3L [place], denies the use of home O2. I have decreased his oxygen from 3L to 2L in an attempt to wean him off of the O2. His sats currently on 2L is 100%, denies any distress or discomfort.

## 2013-09-14 ENCOUNTER — Ambulatory Visit: Payer: Self-pay | Admitting: Internal Medicine

## 2013-09-14 LAB — BASIC METABOLIC PANEL
BUN: 21 mg/dL (ref 6–23)
CO2: 32 meq/L (ref 19–32)
CREATININE: 1.2 mg/dL (ref 0.50–1.35)
Calcium: 10.7 mg/dL — ABNORMAL HIGH (ref 8.4–10.5)
Chloride: 100 mEq/L (ref 96–112)
GFR calc Af Amer: 68 mL/min — ABNORMAL LOW (ref >90)
GFR, EST NON AFRICAN AMERICAN: 59 mL/min — AB (ref >90)
Glucose, Bld: 87 mg/dL (ref 70–99)
Potassium: 4.3 mEq/L (ref 3.7–5.3)
SODIUM: 140 meq/L (ref 137–147)

## 2013-09-14 LAB — PROTIME-INR
INR: 2.17 — ABNORMAL HIGH (ref 0.00–1.49)
PROTHROMBIN TIME: 23.5 s — AB (ref 11.6–15.2)

## 2013-09-14 LAB — MAGNESIUM: MAGNESIUM: 1.9 mg/dL (ref 1.5–2.5)

## 2013-09-14 MED ORDER — CARVEDILOL 3.125 MG PO TABS: 3.1250 mg | ORAL_TABLET | Freq: Two times a day (BID) | ORAL | Status: DC

## 2013-09-14 MED ORDER — WARFARIN SODIUM 5 MG PO TABS
5.0000 mg | ORAL_TABLET | Freq: Once | ORAL | Status: AC
  Administered 2013-09-14: 5 mg via ORAL
  Filled 2013-09-14: qty 1

## 2013-09-14 MED ORDER — WARFARIN SODIUM 5 MG PO TABS: 5.0000 mg | ORAL_TABLET | Freq: Every day | ORAL | Status: AC

## 2013-09-14 MED ORDER — CARVEDILOL 3.125 MG PO TABS
3.1250 mg | ORAL_TABLET | Freq: Two times a day (BID) | ORAL | Status: DC
  Administered 2013-09-14 (×2): 3.125 mg via ORAL
  Filled 2013-09-14 (×3): qty 1

## 2013-09-14 NOTE — Progress Notes (Signed)
Patient ID: Gregg [name], male   DOB: 1941-05-26, 73 y.o.   MRN: [id number]  SUBJECTIVE: Gregg [name] is a 73 y.o. 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 On 04/02/12 underwent neurocognitive testing.and is not a candidate for advanced therapies due to results for neurocognitive results. Maintained on home milrinone.   ECHO 09/2012: EF 10-15% RV moderately HK moderate MR   Admitted with respiratory failure thought primarily due to PNA with component of HF.   He did quite well over the weekend and is walking on his own.  Still on oxygen. SBP 90s-100s  Scheduled Meds: . amiodarone  200 mg Oral Daily  . antiseptic oral rinse  15 mL Mouth Rinse q12n4p  . aspirin EC  81 mg Oral Daily  . benzonatate  200 mg Oral TID  . carvedilol  3.125 mg Oral BID WC  . chlorhexidine  15 mL Mouth Rinse BID  . ferrous fumarate-b12-vitamic C-folic acid  1 capsule Oral Q lunch  . furosemide  40 mg Oral BID  . guaiFENesin  600 mg Oral BID  . loratadine  10 mg Oral Daily  . magnesium oxide  1,000 mg Oral BID  . pantoprazole  40 mg Oral Daily  . potassium chloride  20 mEq Oral BID  . pyridOXINE  100 mg Oral Q lunch  . sildenafil  20 mg Oral TID  . sodium chloride  3 mL Intravenous Q12H  . warfarin  1 each Does not apply Once  . Warfarin - Pharmacist Dosing Inpatient   Does not apply q1800   Continuous Infusions: . milrinone 0.25 mcg/kg/min (09/14/13 0122)   PRN Meds:.albuterol, alum & mag hydroxide-simeth, promethazine    Filed Vitals:   09/13/13 1947 09/13/13 2308 09/14/13 0330 09/14/13 0745  BP: 93/63 95/63 95/61  105/66  Pulse: 78 77  74  Temp:  98.5 F (36.9 C) 98.6 F (37 C) 97.8 F (36.6 C)  TempSrc:  Oral Oral Oral  Resp: 8 22  16    Height:      Weight:   174 lb 13.2 oz (79.3 kg)   SpO2: 92% 93% 98% 95%    Intake/Output Summary (Last 24 hours) at 09/14/13 0852 Last data filed at 09/14/13 0800  Gross per 24 hour  Intake  546.9 ml  Output    453 ml  Net   93.9 ml    LABS: Basic Metabolic Panel:  Recent Labs  40/98/11 0500 09/14/13 0500  NA 143 140  K 4.3 4.3  CL 103 100  CO2 33* 32  GLUCOSE 89 87  BUN 23 21  CREATININE 1.12 1.20  CALCIUM 10.5 10.7*  MG 1.3* 1.9   Liver Function Tests: No results found for this basename: AST, ALT, ALKPHOS, BILITOT, PROT, ALBUMIN,  in the last 72 hours No results found for this basename: LIPASE, AMYLASE,  in the last 72 hours CBC:  Recent Labs  09/12/13 1300  WBC 10.3  HGB 10.3*  HCT 31.6*  MCV 93.5  PLT 551*   Cardiac Enzymes: No results found for this basename: CKTOTAL, CKMB, CKMBINDEX, TROPONINI,  in the last 72 hours BNP: No components found with this basename: POCBNP,  D-Dimer: No results found for this basename: DDIMER,  in the last 72 hours Hemoglobin A1C: No  results found for this basename: HGBA1C,  in the last 72 hours Fasting Lipid Panel: No results found for this basename: CHOL, HDL, LDLCALC, TRIG, CHOLHDL, LDLDIRECT,  in the last 72 hours Thyroid Function Tests: No results found for this basename: TSH, T4TOTAL, FREET3, T3FREE, THYROIDAB,  in the last 72 hours Anemia Panel: No results found for this basename: VITAMINB12, FOLATE, FERRITIN, TIBC, IRON, RETICCTPCT,  in the last 72 hours  RADIOLOGY: Dg Chest 2 View  09/02/2013   CLINICAL DATA:  Cough and emesis.  Joint pain  EXAM: CHEST  2 VIEW  COMPARISON:  07/21/2013  FINDINGS: Stable appearance of dual-chamber left approach ICD/pacer. Right IJ central venous catheter is in stable position. Cardiomegaly which is also stable. New perihilar airspace disease, most dense on the right. No effusion or interstitial coarsening. No pneumothorax. No acute osseous findings.  IMPRESSION: Perihilar airspace  disease is concerning for pneumonia given the focal appearance. Pulmonary edema can also have this appearance.   Electronically Signed   By: Tiburcio PeaJonathan  Watts M.D.   On: 09/02/2013 14:13   Dg Chest Port 1 View  09/05/2013   CLINICAL DATA:  Follow-up pulmonary edema.  EXAM: PORTABLE CHEST - 1 VIEW  COMPARISON:  DG CHEST 1V PORT dated 09/04/2013; DG CHEST 2 VIEW dated 09/02/2013; DG CHEST 2 VIEW dated 07/21/2013  FINDINGS: Interval improvement in the diffuse interstitial and airspace pulmonary edema since the examination yesterday, though moderate to severe airspace opacities persist throughout both lungs. No new pulmonary parenchymal abnormality. Right jugular tunneled central venous catheter tip projects over the lower SVC at or near the cavoatrial junction, unchanged. Left subclavian pacing defibrillator unchanged.  IMPRESSION: Improvement in the CHF since yesterday, though moderate to severe interstitial and airspace pulmonary edema persists. No new abnormalities.   Electronically Signed   By: Hulan Saashomas  Lawrence M.D.   On: 09/05/2013 16:54   Dg Chest Port 1 View  09/04/2013   CLINICAL DATA:  Evaluate pulmonary edema ; leukocytosis, fever  EXAM: PORTABLE CHEST - 1 VIEW  COMPARISON:  Portable exam 0907 hr compared to 09/02/2013  FINDINGS: Right jugular central venous catheter with tip projecting over SVC.  Left subclavian AICD leads project over right atrium and right ventricle.  Enlargement of cardiac silhouette with pulmonary vascular congestion.  Diffuse bilateral airspace infiltrates significantly increased since previous exam could represent pulmonary edema or pneumonia.  No gross pleural effusion or pneumothorax.  IMPRESSION: Enlargement of cardiac silhouette with pulmonary vascular congestion post AICD.  Significantly increased bilateral airspace infiltrates which could represent pulmonary edema or pneumonia.   Electronically Signed   By: Ulyses SouthwardMark  Boles M.D.   On: 09/04/2013 09:21    PHYSICAL EXAM  General:  NAD Lying in bed. + cough Neck: JVD 6, no thyromegaly or thyroid nodule.  Lungs: Decreased breath sounds at bases bilaterally.  CV: Nondisplaced PMI.  Heart irregular S1/S2, no S3/S4, no murmur.  No peripheral edema.  No carotid bruit.  Normal pedal pulses.  Abdomen: Soft, nontender, no hepatosplenomegaly, no distention.  Neurologic: Alert and oriented x 3.  Psych: Normal affect. Extremities: No clubbing or cyanosis.   TELEMETRY: Vpacing HR 70s underlying rhythm AF  Assessment:   1) A/C systolic HF  - EF 10-15% (09/2012)  - on home milrinone 0.25  2) A/C respiratory failure  3) CAP ?  4) Afib s/p DC-CV 3/14 to NSR.   - on Coumadin  5) Acute on chronic renal failure 6) DNR   Plan/Discussion:    Doing very well. Respiratory  status improved. Back on home lasix and milrinone. Plan to go home with home health today.   Continue current dose of Milrinone at 0.25 mcg which is his home dose.  He should be on coumadin 5 mg daily at home.  ASA 81 mg daily, amiodarone 200 mg daily, Lasix 40 mg po bid, KCl 20 daily, Coreg 3.125 mg bid, sildenafil 20 mg tid (other home cardiac meds).  Hold losartan until followup in office.  We will arrange followup in about a week.   Marca Ancona, MD  8:52 AM

## 2013-09-14 NOTE — Progress Notes (Signed)
Pt participates with ADLs and walked in unit. Desats to 75% with irritating cough when placed on room air trials. Sats >93% on 2L.

## 2013-09-14 NOTE — Progress Notes (Signed)
ANTICOAGULATION CONSULT NOTE - Follow Up Consult  Pharmacy Consult for warfarin Indication: atrial fibrillation  No Known Allergies  Patient Measurements: Height: 5\' 10"  (177.8 cm) Weight: 174 lb 13.2 oz (79.3 kg) IBW/kg (Calculated) : 73   Vital Signs: Temp: 97.8 F (36.6 C) (03/02 0745) Temp src: Oral (03/02 0745) BP: 105/66 mmHg (03/02 0745) Pulse Rate: 74 (03/02 0745)  Labs:  Recent Labs  09/12/13 0500 09/12/13 1300 09/13/13 0500 09/14/13 0500  HGB  --  10.3*  --   --   HCT  --  31.6*  --   --   PLT  --  551*  --   --   LABPROT 25.6*  --  24.2* 23.5*  INR 2.43*  --  2.26* 2.17*  CREATININE 1.09  --  1.12 1.20    Estimated Creatinine Clearance: 58.3 ml/min (by C-G formula based on Cr of 1.2).   Medications:  Scheduled:  . amiodarone  200 mg Oral Daily  . antiseptic oral rinse  15 mL Mouth Rinse q12n4p  . aspirin EC  81 mg Oral Daily  . benzonatate  200 mg Oral TID  . carvedilol  3.125 mg Oral BID WC  . chlorhexidine  15 mL Mouth Rinse BID  . ferrous fumarate-b12-vitamic C-folic acid  1 capsule Oral Q lunch  . furosemide  40 mg Oral BID  . guaiFENesin  600 mg Oral BID  . loratadine  10 mg Oral Daily  . magnesium oxide  1,000 mg Oral BID  . pantoprazole  40 mg Oral Daily  . potassium chloride  20 mEq Oral BID  . pyridOXINE  100 mg Oral Q lunch  . sildenafil  20 mg Oral TID  . sodium chloride  3 mL Intravenous Q12H  . warfarin  1 each Does not apply Once  . Warfarin - Pharmacist Dosing Inpatient   Does not apply q1800    Assessment: 55 YOM on warfarin PTA for AFib admitted 09/02/2013.  Initially restarted patients home dose of warfarin, but patient quickly rose in INR. Stopped x6d. Restarted on 2/27 at low dose. INR trending down but therapeutic 2.87>2.43.  Hgb slight trend down and plts have remained stable (last CBC 2/28), no bleeding noted. Patient is on amiodarone, however this is a home medication which should not be acutely affecting INR.  Goal of  therapy: INR 2-3  Plan: - Will give Coumadin 5 mg again tonight. - Previous home Coumadin dose is 5 mg daily with 7.5 on Tues and Friday, but given elevated INRs while inpatient, I would recommend resuming Coumadin with 5 mg daily for now.  Would check INR on Wed. Or Thurs of this week if able. -Daily PT/INR while inpatient.  Tad Moore, BCPS  Clinical Pharmacist Pager [phone]  09/14/2013 8:56 AM

## 2013-09-14 NOTE — Progress Notes (Signed)
Internal Medicine Attending  Date: 09/14/2013  Patient name: Gregg [name] Medical record number: [id number] Date of birth: Aug 31, 1940 Age: 73 y.o. Gender: male  I saw and evaluated the patient, and discussed his care on A.M rounds with housestaff.  I reviewed the resident's note by Dr. Glendell Docker and I agree with the resident's findings and plans as documented in his note.

## 2013-09-14 NOTE — Progress Notes (Signed)
SATURATION QUALIFICATIONS: (This note is used to comply with regulatory documentation for home oxygen)  Patient Saturations on Room Air at Rest = 75%  Patient Saturations on Room Air while Ambulating = 75%  Patient Saturations on 4 Liters of oxygen while Ambulating =94 %  Please briefly explain why patient needs home oxygen: Patient decompensates very quickly to 75% sats without oxygen, despite numerous trials.

## 2013-09-14 NOTE — Progress Notes (Signed)
Pt going home with home health. CSW signing off.    Maryclare Labrador, MSW, [facility] Clinical Social Worker [phone]

## 2013-09-14 NOTE — Discharge Summary (Signed)
Name: Gregg [name] MRN: [id number] DOB: 11/30/1940 73 y.o. PCP: Exie ParodyMichael H Torres, MD  Date of Admission: 09/02/2013  4:39 PM Date of Discharge: 09/14/2013 Attending Physician: Farley LyJerry Dale Joines, MD  Discharge Diagnosis:  Principal Problem:   Acute on chronic systolic heart failure   Active Problems:   Atrial fibrillation  Chronic systolic heart failure  Chronic cough  Sleep apnea  Pneumonia  Acute respiratory failure  Acute pulmonary edema  Discharge Medications:   Medication List    STOP taking these medications       digoxin 0.125 MG tablet  Commonly known as:  LANOXIN     DULCOLAX 100 MG capsule  Generic drug:  docusate sodium     losartan 25 MG tablet  Commonly known as:  COZAAR     senna 8.6 MG Tabs tablet  Commonly known as:  SENOKOT     sodium chloride 0.9 % SOLN with milrinone 1 MG/ML SOLN 200 mcg/mL      TAKE these medications       acetaminophen 650 MG CR tablet  Commonly known as:  TYLENOL  Take 650 mg by mouth 2 (two) times daily as needed for pain.     amiodarone 400 MG tablet  Commonly known as:  PACERONE  Take 200 mg by mouth daily.     aspirin EC 81 MG tablet  Take 81 mg by mouth daily.     carvedilol 3.125 MG tablet  Commonly known as:  COREG  Take 1 tablet (3.125 mg total) by mouth 2 (two) times daily with a meal.     cetirizine 10 MG tablet  Commonly known as:  ZYRTEC  Take 10 mg by mouth daily.     FERROCITE PLUS PO  Take 1 tablet by mouth daily with lunch.     furosemide 40 MG tablet  Commonly known as:  LASIX  Take 40 mg by mouth 2 (two) times daily.     HM VITAMIN B100 COMPLEX PO  Take 1,000 mg by mouth daily with lunch.     hydrocortisone cream 1 %  Apply 1 application topically 2 (two) times daily as needed (for itching or tape irritation).     magnesium oxide 400 MG tablet  Commonly known as:  MAG-OX  Take 800-1,200 mg by mouth daily. 2 tablets (800 mg) every morning and 3 tablets (1200 mg) at lunch     milrinone 20 MG/100ML Soln infusion  Commonly known as:  PRIMACOR  Inject 20.45 mcg/min into the vein continuous.     multivitamin with minerals tablet  Take 1 tablet by mouth daily with lunch.     potassium chloride 10 MEQ tablet  Commonly known as:  K-DUR  Take 2 tablets (20 mEq total) by mouth daily.     pyridOXINE 100 MG tablet  Commonly known as:  VITAMIN B-6  Take 100 mg by mouth daily with lunch.     sildenafil 20 MG tablet  Commonly known as:  REVATIO  Take 1 tablet (20 mg total) by mouth 3 (three) times daily.     warfarin 5 MG tablet  Commonly known as:  COUMADIN  Take 1 tablet (5 mg total) by mouth daily. 1 1/2 tablets (7.5mg ) Tuesdays and Fridays, 1 tablet (5 mg) on Sunday, Monday, Wednesday, Thursday, Saturday        Disposition and follow-up:   GreggArgelio Salomon [name].  At the hospital follow up visit please address:  1.  Resolving PNA, Oxygen Requirement, Afib: Need to follow up on INR to ensure therapeutic level (was supra-therapeutic at ~5), but HF team recommended DCCV.  sCHF: Patient now requires oxygen and needs weight and body fluid monitoring. Patient was taken off of digoxin because of AKI during diuresis. Consider resuming if cr. Is back to baseline.  2.  Labs / imaging needed at time of follow-up: F/U CXR to ensure PNA resolution. Please check BMP for renal function and need for further potassium replacement.   3.  Pending labs/ test needing follow-up: None  Follow-up Appointments:     Follow-up Information   Follow up with Exie Parody, MD On 09/29/2013. (11:15 am)    Specialty:  Family Medicine   Contact information:   [address] [place] [address] [phone]       Follow up with Arvilla Meres, MD On 09/21/2013. (1:30 pm)    Specialty:  Cardiology   Contact information:   [address] [place] [address] [phone]       Discharge  Instructions: Discharge Orders   Future Appointments Provider Department Dept Phone   09/21/2013 1:30 PM [facility] [phone]   09/24/2013 10:45 AM [facility] [employer] [phone]   Future Orders Complete By Expires   Diet - low sodium heart healthy  As directed    Diet - low sodium heart healthy  As directed    Discharge instructions  As directed    Comments:     You have been treated for pneumonia and heart failure. Please follow up with for PCP and Dr. Gala Romney.   For home use only DME oxygen  As directed    Questions:     Mode or (Route):     Liters per Minute:  4   Frequency:     Oxygen conserving device:     Increase activity slowly  As directed    Increase activity slowly  As directed       Consultations: Heart failure team   Procedures Performed:  Dg Chest 2 View  09/02/2013   CLINICAL DATA:  Cough and emesis.  Joint pain  EXAM: CHEST  2 VIEW  COMPARISON:  07/21/2013  FINDINGS: Stable appearance of dual-chamber left approach ICD/pacer. Right IJ central venous catheter is in stable position. Cardiomegaly which is also stable. New perihilar airspace disease, most dense on the right. No effusion or interstitial coarsening. No pneumothorax. No acute osseous findings.  IMPRESSION: Perihilar airspace disease is concerning for pneumonia given the focal appearance. Pulmonary edema can also have this appearance.   Electronically Signed   By: Tiburcio Pea M.D.   On: 09/02/2013 14:13   Dg Chest Port 1 View  09/07/2013   CLINICAL DATA:  Shortness of breath.  Pulmonary edema.  EXAM: PORTABLE CHEST - 1 VIEW  COMPARISON:  Poor Single view of the chest 09/05/2013 and 09/04/2013.  FINDINGS: AICD and right IJ catheter in place. Cardiomegaly is again seen. Pulmonary edema persists but has improved. No pneumothorax or pleural effusion is identified.  IMPRESSION: Continued improvement in pulmonary  edema.  No new abnormality.   Electronically Signed   By: Drusilla Kanner M.D.   On: 09/07/2013 07:30   Dg Chest Port 1 View  09/05/2013   CLINICAL DATA:  Follow-up pulmonary edema.  EXAM: PORTABLE CHEST - 1 VIEW  COMPARISON:  DG CHEST 1V PORT dated 09/04/2013; DG CHEST 2 VIEW  dated 09/02/2013; DG CHEST 2 VIEW dated 07/21/2013  FINDINGS: Interval improvement in the diffuse interstitial and airspace pulmonary edema since the examination yesterday, though moderate to severe airspace opacities persist throughout both lungs. No new pulmonary parenchymal abnormality. Right jugular tunneled central venous catheter tip projects over the lower SVC at or near the cavoatrial junction, unchanged. Left subclavian pacing defibrillator unchanged.  IMPRESSION: Improvement in the CHF since yesterday, though moderate to severe interstitial and airspace pulmonary edema persists. No new abnormalities.   Electronically Signed   By: Hulan Saas M.D.   On: 09/05/2013 16:54   Dg Chest Port 1 View  09/04/2013   CLINICAL DATA:  Evaluate pulmonary edema ; leukocytosis, fever  EXAM: PORTABLE CHEST - 1 VIEW  COMPARISON:  Portable exam 0907 hr compared to 09/02/2013  FINDINGS: Right jugular central venous catheter with tip projecting over SVC.  Left subclavian AICD leads project over right atrium and right ventricle.  Enlargement of cardiac silhouette with pulmonary vascular congestion.  Diffuse bilateral airspace infiltrates significantly increased since previous exam could represent pulmonary edema or pneumonia.  No gross pleural effusion or pneumothorax.  IMPRESSION: Enlargement of cardiac silhouette with pulmonary vascular congestion post AICD.  Significantly increased bilateral airspace infiltrates which could represent pulmonary edema or pneumonia.   Electronically Signed   By: Ulyses Southward M.D.   On: 09/04/2013 09:21   Admission HPI:  Lawrance Wiedemann is a 73 year old man with past medical history of CHF (EF 10-15%, 09/2012) with ICD  in place, on chronic outpatient milrinone, CKD Stage III, afib on coumadin, OSA on CPAP, chronic 3 month cough and past tobacco abuse who presents with worsening cough. The patient notes a 2-week history of worsening cough, rarely productive of scant clear mucous, worse at night (awakening patient from sleep) though also present throughout the day. At symptom onset 2 weeks ago, he noted associated rhinorrhea, "sinus headache", and a sensation of post-nasal drip, though these associated symptoms have now improved. He notes some muscle and joint pains associated with vigorous coughing, and post-tussive emesis for the last 1-2 weeks, but no sick contacts, fevers, dyspnea, sinus tenderness, or chest pain. He has a history of CHF, but notes no weight gain, LE edema, change in his baseline 2-pillow orthopnea, or PND. He notes compliance with home CPAP.  Chart review reveals that the patient has had a chronic cough, waxing and waning between Aug 2013 - Nov 2014, though notably worse since Nov 2014. He has been evaluated by Pulmonology for the last 1 month for work-up of this issue, thought to be upper airway cough syndrome vs GERD, among other possibilities. Symptoms improved slightly with a short course of prednisone 08/10/13, and He has been using Zyrtec, albuterol inhaler, and PPI. He received influenza vaccine.   Hospital Course by problem list: Principal Problem:   Acute on chronic systolic heart failure Active Problems:   Atrial fibrillation   Chronic systolic heart failure   Chronic cough   Sleep apnea   Pneumonia   Acute respiratory failure   Acute pulmonary edema  Acute on chronic CHF: CXR on 09/04/2013 showed acute onset of pulmonary edema. Patient was initially very dyspneic required BiPAP. He was transiently managed in the ICU but intubation was not required due to improvement and DNR/DNI (after discussion with patient and his wife). Dr Gala Romney of the heart failure team was consulted and his was  closely involved in his care. Aggressive diuretic therapy was complicated with rising creatinine to a peak of 2.25  and there it was held until renal function recovered. His weight improved 185lb on admission to 170 by the time of discharge and his symptoms were significantly better. Digoxin was also held due to renal function, but should consider restarting as Cr is back to baseline (1.13) as of 09/11/2013. His was discharged to home on his milrinone IV via home health services.   CAP: Patient presented with fever, CXR on admission showing pulmonary edema vs PNA, elevated WBC (20), but negative blood cultures. Course complicated by acute CHF, but patient's symptoms resolved with aggressive diuresis. See his Antibiotics regimen below:  Azithro 2/19 > 2/20  Ceftriaxone 2/19 >> 2/20  Cefepime 2/20 >> 2/24  However, patient required 2L of oxygen on discharge.   Afib: Patient continued on amiodarone, but INR was discovered to be supra-therapeutic at ~5 during admission. INR on discharge is 2.17. Warfarin was held. Need to check INR at follow up and dose warfarin accordingly.   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 Of 1.13 with holding of aggressive diuresis. Digoxin was held due to renal function. Need to consider re-starting digoxin.  Patient was treated with home regimen for allergic rhinitis, and GERD.   Discharge Vitals:   BP 105/66  Pulse 81  Temp(Src) 97.8 F (36.6 C) (Oral)  Resp 20  Ht 5\' 10"  (1.778 m)  Wt 174 lb 13.2 oz (79.3 kg)  BMI 25.08 kg/m2  SpO2 92%  Discharge Labs:  Results for orders placed during the hospital encounter of 09/02/13 (from the past 24 hour(s))  PROTIME-INR     Status: Abnormal   Collection Time    09/14/13  5:00 AM      Result Value Ref Range   Prothrombin Time 23.5 (*) 11.6 - 15.2 seconds   INR 2.17 (*) 0.00 - 1.49  BASIC METABOLIC PANEL     Status: Abnormal   Collection Time    09/14/13  5:00 AM       Result Value Ref Range   Sodium 140  137 - 147 mEq/L   Potassium 4.3  3.7 - 5.3 mEq/L   Chloride 100  96 - 112 mEq/L   CO2 32  19 - 32 mEq/L   Glucose, Bld 87  70 - 99 mg/dL   BUN 21  6 - 23 mg/dL   Creatinine, Ser 4.28  0.50 - 1.35 mg/dL   Calcium 76.8 (*) 8.4 - 10.5 mg/dL   GFR calc non Af Amer 59 (*) >90 mL/min   GFR calc Af Amer 68 (*) >90 mL/min  MAGNESIUM     Status: None   Collection Time    09/14/13  5:00 AM      Result Value Ref Range   Magnesium 1.9  1.5 - 2.5 mg/dL    Signed: Pleas Koch, MD 09/14/2013, 11:42 AM   Time Spent on Discharge: 35 minutes Services Ordered on Discharge: Oxygen Continuous Home Equipment Ordered on Discharge: Oxygen Continuous Home

## 2013-09-14 NOTE — Discharge Summary (Signed)
I agree with summary as outlined below 

## 2013-09-14 NOTE — Progress Notes (Signed)
Subjective:  NAE ON. Doing well. Requests to be discharged home. States that he is at his baseline.   Objective: Vital signs in last 24 hours: Filed Vitals:   09/13/13 1947 09/13/13 2308 09/14/13 0330 09/14/13 0745  BP: 93/63 95/63 95/61  105/66  Pulse: 78 77  74  Temp:  98.5 F (36.9 C) 98.6 F (37 C) 97.8 F (36.6 C)  TempSrc:  Oral Oral Oral  Resp: 8 22  16   Height:      Weight:   174 lb 13.2 oz (79.3 kg)   SpO2: 92% 93% 98% 95%   Weight change: 0 lb (0 kg)  Intake/Output Summary (Last 24 hours) at 09/14/13 [id number] Last data filed at 09/14/13 0600  Gross per 24 hour  Intake  414.7 ml  Output    453 ml  Net  -38.3 ml    Physical Exam  Constitutional: He is oriented to person, place, and time. He appears well-developed and well-nourished. No distress.  Neck: No JVD present.  Cardiovascular: Normal rate, regular rhythm, normal heart sounds and intact distal pulses.  Exam reveals no friction rub.   No murmur heard. Pulmonary/Chest: Effort normal. No respiratory distress. He has no wheezes. He has rales. He exhibits no tenderness.  Crackles in bases and mild crackles in mid lungs bilaterally.  Abdominal: Soft. Bowel sounds are normal. He exhibits no distension. There is no tenderness.  Musculoskeletal: He exhibits no edema and no tenderness.  Neurological: He is alert and oriented to person, place, and time. No cranial nerve deficit. Coordination normal.  Skin: He is not diaphoretic.  Psychiatric: He has a normal mood and affect. His behavior is normal.     Lab Results: Basic Metabolic Panel:  Recent Labs Lab 09/13/13 0500 09/14/13 0500  NA 143 140  K 4.3 4.3  CL 103 100  CO2 33* 32  GLUCOSE 89 87  BUN 23 21  CREATININE 1.12 1.20  CALCIUM 10.5 10.7*  MG 1.3* 1.9   CBC:  Recent Labs Lab 09/12/13 1300  WBC 10.3  HGB 10.3*  HCT 31.6*  MCV 93.5  PLT 551*   Coagulation:  Recent Labs Lab 09/11/13 0440 09/12/13 0500 09/13/13 0500 09/14/13 0500    LABPROT 29.1* 25.6* 24.2* 23.5*  INR 2.87* 2.43* 2.26* 2.17*   Medications: I have reviewed the patient's current medications. Scheduled Meds: . amiodarone  200 mg Oral Daily  . antiseptic oral rinse  15 mL Mouth Rinse q12n4p  . aspirin EC  81 mg Oral Daily  . benzonatate  200 mg Oral TID  . chlorhexidine  15 mL Mouth Rinse BID  . ferrous fumarate-b12-vitamic C-folic acid  1 capsule Oral Q lunch  . furosemide  40 mg Oral BID  . guaiFENesin  600 mg Oral BID  . loratadine  10 mg Oral Daily  . magnesium oxide  1,000 mg Oral BID  . pantoprazole  40 mg Oral Daily  . potassium chloride  20 mEq Oral BID  . pyridOXINE  100 mg Oral Q lunch  . sildenafil  20 mg Oral TID  . sodium chloride  3 mL Intravenous Q12H  . warfarin  1 each Does not apply Once  . Warfarin - Pharmacist Dosing Inpatient   Does not apply q1800   Continuous Infusions: . milrinone 0.25 mcg/kg/min (09/14/13 0122)   PRN Meds:.albuterol, alum & mag hydroxide-simeth, promethazine Assessment/Plan: Principal Problem:   Acute on chronic systolic heart failure Active Problems:   Atrial fibrillation   Chronic systolic  heart failure   Chronic cough   Sleep apnea   Pneumonia   Acute respiratory failure   Acute pulmonary edema  Acute on chronic heart failure: [name] acute heart failure exacerbation appears to be resolved.  He is followed as outpatient by Dr. Gala Romney. Plan to hold ACEi until seen as outpatient by Dr. Gala Romney.  - Will continue home lasix 40 mg BID, sildenafil 20 mg TID, ASA 81 mg, and milrinone - Restarting Coreg per cards note.  Pneumonia Patients PNA appears to have resolved. Patient cotinues to require 2 L Oxygen by [place].  - Will need f/u CXR and outpatient oxygen  Atrial Fibrillation: Currently on amio 200 mg qd and warfarin. INR is therapeutic.  Obstructive Sleep Apnea:  Cont home CPAP.  Deconditioning: The patient has improved with physical therapy he states that he is ready to go  home. He belives that he is at his baseline and can perform his ADLs. His wife can provdie him with care and will be able to transport him to his appointments and therapy sessions.  - Plan for home health eval and treat PT.  DVT Ppx: SCD's and pt on warfarin for Afib  Code: DNR, DNI, POA is wife    Dispo: Plan to discharge today to home.   The patient does have a current PCP Exie Parody, MD) and does not need an [facility] hospital follow-up appointment after discharge.  The patient does have transportation limitations that hinder transportation to clinic appointments.  .Services Needed at time of discharge: Y = Yes, Blank = No PT:   OT:   RN:   Equipment:   Other:     LOS: 12 days   Pleas Koch, MD 09/14/2013, 8:21 AM

## 2013-09-17 ENCOUNTER — Encounter: Payer: Self-pay | Admitting: Internal Medicine

## 2013-09-18 ENCOUNTER — Other Ambulatory Visit (HOSPITAL_COMMUNITY): Payer: Self-pay

## 2013-09-18 ENCOUNTER — Telehealth (HOSPITAL_COMMUNITY): Payer: Self-pay

## 2013-09-18 MED ORDER — MAGNESIUM OXIDE 400 MG PO TABS: 1200.0000 mg | ORAL_TABLET | Freq: Two times a day (BID) | ORAL | Status: AC

## 2013-09-18 NOTE — Telephone Encounter (Signed)
Patient returned call, aware of mag ox 1200 bid and hold lasic and potassium until resumed on Monday.

## 2013-09-18 NOTE — Telephone Encounter (Signed)
Left message for patient, needs to hold potassium and lasix on weekend, resume Monday, and increase mag ox to 1200 BID.  Will reattempt call before clinic closes.

## 2013-09-21 ENCOUNTER — Encounter (HOSPITAL_COMMUNITY): Payer: Self-pay

## 2013-09-21 ENCOUNTER — Ambulatory Visit (HOSPITAL_COMMUNITY)
Admission: RE | Admit: 2013-09-21 | Discharge: 2013-09-21 | Disposition: A | Discharge status: Home / Self Care | Payer: Medicare Other | Attending: Internal Medicine | Admitting: Internal Medicine

## 2013-09-21 VITALS — BP 103/68 | HR 88 | Resp 20 | Wt 182.0 lb

## 2013-09-21 DIAGNOSIS — I5022 Chronic systolic (congestive) heart failure: Secondary | ICD-10-CM | POA: Insufficient documentation

## 2013-09-21 LAB — BASIC METABOLIC PANEL
BUN: 36 mg/dL — AB (ref 6–23)
CALCIUM: 10.7 mg/dL — AB (ref 8.4–10.5)
CO2: 28 mEq/L (ref 19–32)
CREATININE: 2.02 mg/dL — AB (ref 0.50–1.35)
Chloride: 99 mEq/L (ref 96–112)
GFR calc non Af Amer: 31 mL/min — ABNORMAL LOW (ref >90)
GFR, EST AFRICAN AMERICAN: 36 mL/min — AB (ref >90)
Glucose, Bld: 105 mg/dL — ABNORMAL HIGH (ref 70–99)
Potassium: 4.7 mEq/L (ref 3.7–5.3)
Sodium: 138 mEq/L (ref 137–147)

## 2013-09-21 MED ORDER — BENZONATATE 100 MG PO CAPS: 100.0000 mg | ORAL_CAPSULE | Freq: Three times a day (TID) | ORAL | Status: AC | PRN

## 2013-09-21 NOTE — Assessment & Plan Note (Signed)
Cough hasn't improved. He continues to have allergic rhinitis and this is one of the drivers of the cough. He hasn't had PFT yet.  - continue PPI and antihistamine - add fluticasone nasal spray - full PFT to review with dr Sherene Sires

## 2013-09-21 NOTE — Progress Notes (Signed)
Patient ID: Gregg [name], male   DOB: October 17, 1940, 73 y.o.   MRN: [id number] PCP (manages PT/INR): Dr Allene Dillon-   HPI  [name] is a 73 y.o. 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  He has sought a second opinion with Dr Adriana Simas in [place]. On 04/02/12 underwent neurocognitive testing to assess his candidacy for advanced therapies. Not a candidate for advanced therapies due to results for neurocognitive results.   LHC (02/14/12) demonstrated normal coronaries. Hemodynamics were Ao Pressure: 79/68 (55), LV Pressure: 82/18 (26), Pulmonary artery sats 27% and 28%, RA 21, PA 59/45 (52), PCWP 33, and Fick 2.1/1.1. IABP was placed in addition to milrinone 0.25 mcg/kg/min support for cardiogenic shock. IABP was weaned and milrinone continued. He did well and was able to be discharged home on home milrinone. Revatio was also started for increased PA pressures and PVR. Discharge weight in 8/13 was 156 pounds.   Admitted to [facility] 09/02/13  with respiratory failure and CAP. Treated for CAP. Required short term Bipap. Initially he was diuresed  With IV lasix  but he had creatinine bump with diuretics held. Losartan and digoxin stopped due to elevated creatinine. H was discharged on lasix 40 mg bid. He also required 2 liters [place] continuously. Discharge weight 174 pounds 5 pounds.He continued on Milrinone 0.25 mcg through out his hospitalization.    He returns for post hospital follow up. Over the weekend he was instructed to hold diuretics due to elevated creatinine. Complains of fatigue. Poor appetite. Complains of abdominal bloating. SOB with exertion. Ongoing cough at night. When he walks he is using a walker but he is  walking from the bed to the chair at home.  SOB when he completes ADLs. His wife says On 2.5 liters at home. Weight at home 173-175 pounds. Continues with home milrinone 0.25 mcg through [place] cath. Followed by [facility]. HHRN and HHPT.   02/21/2012 ECHO EF  10-15% 05/12/12 ECHO EF 15% central Mitral Valve regurgitation 09/2012    ECHO 10-15% RV moderately HK moderate MR  Labs 09/07/13 K 3.8 Creatinine 2.27- dig and losartan stopped.  Labs 09/14/13 K 4.3 Creatinine 1.2    ROS: All systems negative except as listed in HPI, PMH and Problem List.  Past Medical History  Diagnosis Date  . ICD (implantable cardiac defibrillator) in place   . GERD (gastroesophageal reflux disease)   . Chronic kidney disease   . Anemia   . Pacemaker   . CHF (congestive heart failure)   . Sleep apnea     uses CPAP  . Dysrhythmia   . Pneumonia 08/2013    Current Outpatient Prescriptions  Medication Sig Dispense Refill  . acetaminophen (TYLENOL) 650 MG CR tablet Take 650 mg by mouth 2 (two) times daily as needed for pain.      Marland Kitchen amiodarone (PACERONE) 400 MG tablet Take 200 mg by mouth daily.      Marland Kitchen aspirin EC 81 MG tablet Take 81 mg by mouth daily.      . B Complex-Biotin-FA (HM VITAMIN B100 COMPLEX PO) Take 1,000 mg by mouth daily with lunch.      . carvedilol (COREG) 3.125 MG tablet Take 1 tablet (3.125 mg total) by mouth  2 (two) times daily with a meal.  60 tablet  0  . cetirizine (ZYRTEC) 10 MG tablet Take 10 mg by mouth daily.      . Fe Fum-FA-B Cmp-C-Zn-Mg-Mn-Cu (FERROCITE PLUS PO) Take 1 tablet by mouth daily with lunch.       . furosemide (LASIX) 40 MG tablet Take 40 mg by mouth 2 (two) times daily.      . hydrocortisone cream 1 % Apply 1 application topically 2 (two) times daily as needed (for itching or tape irritation).       . magnesium oxide (MAG-OX) 400 MG tablet Take 3 tablets (1,200 mg total) by mouth 2 (two) times daily.  180 tablet  3  . milrinone (PRIMACOR) 20 MG/100ML SOLN infusion Inject  20.45 mcg/min into the vein continuous.  100 mL  0  . Multiple Vitamins-Minerals (MULTIVITAMIN WITH MINERALS) tablet Take 1 tablet by mouth daily with lunch.       . pantoprazole (PROTONIX) 40 MG tablet Take 40 mg by mouth daily.      . potassium chloride (K-DUR) 10 MEQ tablet Take 2 tablets (20 mEq total) by mouth daily.  60 tablet  0  . pyridOXINE (VITAMIN B-6) 100 MG tablet Take 100 mg by mouth daily with lunch.       . sildenafil (REVATIO) 20 MG tablet Take 1 tablet (20 mg total) by mouth 3 (three) times daily.  90 tablet  12  . warfarin (COUMADIN) 5 MG tablet Take 1 tablet (5 mg total) by mouth daily.  30 tablet  0   No current facility-administered medications for this encounter.   Facility-Administered Medications Ordered in Other Encounters  Medication Dose Route Frequency Provider Last Rate Last Dose  . 0.9 %  sodium chloride infusion    Continuous PRN Lovie CholJennifer K Rock, CRNA          Filed Vitals:   09/21/13 1355  BP: 103/68  Pulse: 88  Resp: 20  Weight: 182 lb (82.555 kg)  SpO2: 98%   PHYSICAL EXAM: General:  Chronically ill appearing. No resp difficulty. Arrived to clinic in wheelchair.  Wife present HEENT: normal Neck: supple. JVP 6-7. Carotids 2+ bilaterally; no bruits. No lymphadenopathy or thryomegaly appreciated. Cor: PMI laterally displaced. No S3. Regular rate & rhythm. R upper chest [place]. No rubs. 2/6 MR Lungs:  Lungs  Decreased in base. On 2.5 liters [place].  Abdomen: soft, nontender,  nondistended. No hepatosplenomegaly. No bruits or masses. Good bowel sounds. Extremities: no cyanosis, clubbing, rash, edema   Neuro: alert & orientedx3, cranial nerves grossly intact. Moves all 4 extremities w/o difficulty. Affect pleasant.   ASSESSMENT & PLAN: 1) Chronic systolic HF with biventricular dysfunction.Has Guidant ICD     -Revewed D/C Summary from 09/14/13. NYHA IIIB/IV symptoms but  Milrinone dependent 0.25 mcg via [place]man. Co-OX during hospitalization remained > 60%  so increasing Milrionoe will likely not help dyspnea. Volume status stable. Continue lasix 40 mg twice a day.  Not on bb due to low output. Hold losartan and digoxin due to CKD.  From HF stand point his volume status is stable. Continue lasix 40 mg twice a day.      -not candidate for advanced therapies due to poor performance on neurocognitive testing 2) CAP- completed antibiotics. Azithro 2/19 > 2/20 Ceftriaxone 2/19 >> 2/20  Cefepime 2/20 >> 2/24 . Continue 2 liter oxygen per [place]. Start tessalon perls as needed. Follow up with Dr Sherene SiresWert made.  3) Dementia 4) AF, on coumadin. Rate  controlled. 54) OSA unable to use CPA P due to dyspnea.  5) DNR - completed gold DNR form today.   Follow up on Monday. If no improvement will need to consider RHC.   CLEGG,AMY, NP-C 2:02 PM  Patient seen and examined with Tonye Becket, NP. We discussed all aspects of the encounter. I agree with the assessment and plan as stated above.   He has not done well since his most recent hospitalization. Suspect he is nearing end-stage. DNR form completed today. He appears that he is ready to consider Hospice but his wife is not there yet. Volume status looks ok. We will continue current regimen and see him back early next week to check up on him.   Daniel Bensimhon,MD 4:03 PM

## 2013-09-21 NOTE — Patient Instructions (Signed)
Follow up next Monday   Do the following things EVERYDAY: 1) Weigh yourself in the morning before breakfast. Write it down and keep it in a log. 2) Take your medicines as prescribed 3) Eat low salt foods-Limit salt (sodium) to 2000 mg per day.  4) Stay as active as you can everyday 5) Limit all fluids for the day to less than 2 liters+

## 2013-09-23 ENCOUNTER — Inpatient Hospital Stay: Payer: Self-pay | Admitting: Internal Medicine

## 2013-09-23 ENCOUNTER — Telehealth: Payer: Self-pay | Admitting: Internal Medicine

## 2013-09-23 NOTE — Telephone Encounter (Signed)
Called # provided and phone line is not working. A lot of static and beeping when calling # Called alternate # and LMTCB x1

## 2013-09-24 ENCOUNTER — Ambulatory Visit (INDEPENDENT_AMBULATORY_CARE_PROVIDER_SITE_OTHER): Payer: Medicare Other | Admitting: *Deleted

## 2013-09-24 DIAGNOSIS — Z95810 Presence of automatic (implantable) cardiac defibrillator: Secondary | ICD-10-CM

## 2013-09-24 DIAGNOSIS — I5022 Chronic systolic (congestive) heart failure: Secondary | ICD-10-CM

## 2013-09-24 LAB — MDC_IDC_ENUM_SESS_TYPE_REMOTE
Brady Statistic RV Percent Paced: 59 %
Date Time Interrogation Session: 20150312060100
HighPow Impedance: 39 Ohm
Lead Channel Impedance Value: 329 Ohm
Lead Channel Impedance Value: 384 Ohm
Lead Channel Sensing Intrinsic Amplitude: 1.5 mV
Lead Channel Sensing Intrinsic Amplitude: 8.5 mV
MDC IDC PG SERIAL: 157921
MDC IDC SET LEADCHNL RA PACING AMPLITUDE: 2.4 V
MDC IDC SET LEADCHNL RV PACING AMPLITUDE: 2.4 V
MDC IDC SET LEADCHNL RV PACING PULSEWIDTH: 0.6 ms
MDC IDC STAT BRADY RA PERCENT PACED: 0 %
Zone Setting Detection Interval: 293 ms
Zone Setting Detection Interval: 324 ms

## 2013-09-24 NOTE — Telephone Encounter (Signed)
LMTCBx2 on cell number. Home number not working. Carron Curie, CMA

## 2013-09-25 NOTE — Telephone Encounter (Signed)
lmtcbx3 on cell #. Carron Curie, CMA

## 2013-09-28 ENCOUNTER — Encounter (HOSPITAL_COMMUNITY): Payer: Self-pay

## 2013-09-28 ENCOUNTER — Ambulatory Visit (HOSPITAL_COMMUNITY)
Admission: RE | Admit: 2013-09-28 | Discharge: 2013-09-28 | Disposition: A | Discharge status: Home / Self Care | Payer: Medicare Other | Source: Ambulatory Visit | Attending: Internal Medicine | Admitting: Internal Medicine

## 2013-09-28 VITALS — BP 104/70 | HR 62 | Resp 20 | Wt 184.1 lb

## 2013-09-28 DIAGNOSIS — I5022 Chronic systolic (congestive) heart failure: Secondary | ICD-10-CM | POA: Insufficient documentation

## 2013-09-28 NOTE — Patient Instructions (Addendum)
Increase milrinone to 0.375 mcg.  Stop coreg.  Increase your lasix 80 mg twice a day (2 tablets twice a day for 3 days)  Take metolazone 2.5 mg for 2 days.  Increase your potassium to 40 meq twice a day. (2 tablets in the morning and 2 tablets in the evening)  Call on Wednesday to let us know how he is feeling.  F/U 2 weeks

## 2013-09-28 NOTE — Progress Notes (Signed)
Patient ID: Gregg CageFrank [name], male   DOB: 08/10/1940, 73 y.o.   MRN: [id number] PCP (manages PT/INR): Gregg [name]-   HPI  [name] is a 73 y.o. 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  He has [name]. On 04/02/12 underwent neurocognitive testing to assess his candidacy for advanced therapies. Not a candidate for advanced therapies due to results for neurocognitive results.   LHC (02/14/12) demonstrated normal coronaries. Hemodynamics were Ao Pressure: 79/68 (55), LV Pressure: 82/18 (26), Pulmonary artery sats 27% and 28%, RA 21, PA 59/45 (52), PCWP 33, and [name] 2.1/1.1. IABP was placed in addition to milrinone 0.25 mcg/kg/min support for cardiogenic shock. IABP was weaned and milrinone continued. He did well and was able to be discharged home on home milrinone. Revatio was also started for increased PA pressures and PVR. Discharge weight in 8/13 was 156 pounds.   [facility]  with respiratory failure and CAP. Treated for CAP. Required short term Bipap. Initially he was diuresed  With IV lasix  but he had creatinine bump with diuretics held. Losartan and digoxin stopped due to elevated creatinine. He was discharged on lasix 40 mg bid. He also required 2 liters [place] continuously. Discharge weight 174 pounds.He continued on Milrinone 0.25 mcg through out his hospitalization.   Follow up: Last visit tessalon pearls added and he was instructed to follow up with Gregg [name], however he was not feeling well and has not followed up. Last visit signed Gold form for no CPR or intubation. Not feeling well and reports his stomach hurts. Not eating much at home. SOB with  exertion and minimal activity, wife dresses him. Wearing continuous O2 at home. Weight at home 175-178 lbs. Continues with home milrinone and [facility]. Denies CP. +edema and orthopnea, sleeps in recliner.   02/21/2012 ECHO EF  10-15% 05/12/12 ECHO EF 15% central Mitral Valve regurgitation 09/2012    ECHO 10-15% RV moderately HK moderate MR  Labs 09/07/13 K 3.8 Creatinine 2.27- dig and losartan stopped.  Labs 09/14/13 K 4.3 Creatinine 1.2    ROS: All systems negative except as listed in HPI, PMH and Problem List.  Past Medical History  Diagnosis Date  . ICD (implantable cardiac defibrillator) in place   . GERD (gastroesophageal reflux disease)   . Chronic kidney disease   . Anemia   . Pacemaker   . CHF (congestive heart failure)   . Sleep apnea     uses CPAP  . Dysrhythmia   . Pneumonia 08/2013    Current Outpatient Prescriptions  Medication Sig Dispense Refill  . acetaminophen (TYLENOL) 650 MG CR tablet Take 650 mg by mouth 2 (two) times daily as needed for pain.      Marland Kitchen. amiodarone (PACERONE) 400 MG tablet Take 200 mg by mouth daily.      Marland Kitchen. aspirin EC 81 MG tablet Take 81 mg by mouth daily.      . B Complex-Biotin-FA (HM VITAMIN B100 COMPLEX PO) Take 1,000 mg by mouth daily with lunch.      . benzonatate (TESSALON PERLES) 100 MG capsule Take 1 capsule (100 mg total) by mouth 3 (three) times daily as needed  for cough.  20 capsule  0  . carvedilol (COREG) 3.125 MG tablet Take 1 tablet (3.125 mg total) by mouth 2 (two) times daily with a meal.  60 tablet  0  . cetirizine (ZYRTEC) 10 MG tablet Take 10 mg by mouth daily.      . Fe Fum-FA-B Cmp-C-Zn-Mg-Mn-Cu (FERROCITE PLUS PO) Take 1 tablet by mouth daily with lunch.       . furosemide (LASIX) 40 MG tablet Take 40 mg by mouth 2 (two) times daily.      . hydrocortisone cream 1 % Apply 1 application topically 2 (two) times daily as needed (for itching or tape irritation).       . magnesium oxide (MAG-OX) 400 MG tablet Take 3  tablets (1,200 mg total) by mouth 2 (two) times daily.  180 tablet  3  . milrinone (PRIMACOR) 20 MG/100ML SOLN infusion Inject 20.45 mcg/min into the vein continuous.  100 mL  0  . Multiple Vitamins-Minerals (MULTIVITAMIN WITH MINERALS) tablet Take 1 tablet by mouth daily with lunch.       . pantoprazole (PROTONIX) 40 MG tablet Take 40 mg by mouth daily.      . potassium chloride (K-DUR) 10 MEQ tablet Take 2 tablets (20 mEq total) by mouth daily.  60 tablet  0  . pyridOXINE (VITAMIN B-6) 100 MG tablet Take 100 mg by mouth daily with lunch.       . sildenafil (REVATIO) 20 MG tablet Take 1 tablet (20 mg total) by mouth 3 (three) times daily.  90 tablet  12  . warfarin (COUMADIN) 5 MG tablet Take 1 tablet (5 mg total) by mouth daily.  30 tablet  0   No current facility-administered medications for this encounter.   Facility-Administered Medications Ordered in Other Encounters  Medication Dose Route Frequency Provider Last Rate Last Dose  . 0.9 %  sodium chloride infusion    Continuous PRN Gregg Chol, CRNA          Filed Vitals:   09/28/13 1222  BP: 104/70  Pulse: 62  Resp: 20  Weight: 184 lb 2 oz (83.519 kg)  SpO2: 96%   PHYSICAL EXAM: General:  Chronically ill appearing. No resp difficulty. Arrived to clinic in wheelchair.  Wife present HEENT: normal Neck: supple. JVP to jaw; Carotids 2+ bilaterally; no bruits. No lymphadenopathy or thryomegaly appreciated. Cor: PMI laterally displaced. + S3. Regular rate & rhythm. R upper chest [place]. No rubs. 2/6 MR Lungs:  Lungs  Decreased in base. On 2.5 liters [place].  Abdomen: soft, tender,  nondistended. No hepatosplenomegaly. No bruits or masses. Good bowel sounds. Extremities: no cyanosis, clubbing, rash, 2-3+ LE edema, cool extremities  Neuro: alert & orientedx3, cranial nerves grossly intact. Moves all 4 extremities w/o difficulty. Affect pleasant.   ASSESSMENT & PLAN:  1) Chronic systolic HF with biventricular dysfunction; Has  Guidant ICD - He is on continuous milrinone 0.25 and has NYHA IV symptoms and volume status elevated. Weight is up 10 lbs since discharge 09/14/13. Gregg. Gala Romney discussed with patient and wife about options such as admitting patient for RHC to assess hemodynamics, trying to increase milrinone to 0.375 mcg or getting Hospice involved.  - - Will increase lasix to 80 mg BID with metolazone 2.5 mg for 2 days. Increase potassium to 40 meq BID. - He remains on low dose BB, will stop coreg. - not candidate for advanced therapies due to poor performance on neurocognitive testing  2) CAP- completed antibiotics. Azithro 2/19 >  2/20 Ceftriaxone 2/19 >> 2/20  Cefepime 2/20 >> 2/24 . Continue 2 liter oxygen per [place]. Start tessalon perls as needed. Follow up with Gregg Sherene Sires made.  3) Dementia 4) AF, on coumadin. Rate controlled. 54) OSA unable to use CPA P due to dyspnea.  5) A/c renal failure - likely cardiorenal in nature 6) DNR - completed gold DNR form today.   Follow up on Monday. If no improvement will need to consider RHC.    Aundria Rud, NP-C 12:45 PM  Patient seen and examined with Ulla Potash, NP. We discussed all aspects of the encounter. I agree with the assessment and plan as stated above.   He appears to have end-stage HF with low-output physiology and significant volume overload despite milrinone support. We discussed options of readmission, RHC or Hospice Care. Gregg [name] feels he is ready for Hospice but his wife is struggling with that decision. We have decided to increase his milrinone to 0.375 mcg/kg/min and increase his diuretic regimen as above and see if that helps him. We will touch base with him on Wednesday. If no improvement, we will revisit turning his milrinone off and proceeding with Hospice/Comfort Care at his request. He is now DNR/DNI.   Total time spent 45 mins with over half that time discussing above.   Daniel Bensimhon,MD 6:04 PM

## 2013-09-28 NOTE — Telephone Encounter (Signed)
lmtcb x4 

## 2013-09-29 ENCOUNTER — Encounter: Payer: Self-pay | Admitting: Internal Medicine

## 2013-09-30 ENCOUNTER — Encounter: Payer: Self-pay | Admitting: Internal Medicine

## 2013-10-02 ENCOUNTER — Encounter: Payer: Self-pay | Admitting: Internal Medicine

## 2013-10-03 ENCOUNTER — Telehealth: Payer: Self-pay | Admitting: Physician Assistant

## 2013-10-03 NOTE — Telephone Encounter (Signed)
Pt's wife called Sat ans svc with question about diuretic. Saw CHF clinic on 3/16 at which time weight was up 10 lbs. Lasix was increased to 80mg  BID with 2 doses of metolazone.  Weight at home: Tue - 173 Wed - 169 Thur - 168 Fri - 167 Sat - 164 Pt feels good without acute complaint except UOP has declined. D/w Dr. Gala Romney - hold Lasix x 2 days then restart. Pt's wife instructed that if UOP does not improve he should prob come to hospital to get kidney function checked. She verbalized understanding and gratitude. Afiya Ferrebee PA-C

## 2013-10-05 ENCOUNTER — Encounter: Payer: Self-pay | Admitting: *Deleted

## 2013-10-07 ENCOUNTER — Telehealth (HOSPITAL_COMMUNITY): Payer: Self-pay | Admitting: *Deleted

## 2013-10-07 NOTE — Telephone Encounter (Signed)
Pt's wife called concerned about pt is now ready to accept Hospice, she states he doesn't really eat anymore and has became very weak, Dr Gala Romney called and spoke w/wife and explained Hospice and turning off Milrinone she is in agreement, referral made to [facility] in [place], once they accept pt they will d/c Milrinone, order and clinical notes faxed to them at [phone]

## 2013-10-12 ENCOUNTER — Inpatient Hospital Stay (HOSPITAL_COMMUNITY): Admission: RE | Admit: 2013-10-12 | Payer: Self-pay | Source: Ambulatory Visit

## 2013-10-12 NOTE — Addendum Note (Signed)
Encounter addended by: Dolores Patty, MD on: 10/12/2013  4:04 PM<BR>     Documentation filed: Follow-up Section, LOS Section, Notes Section

## 2013-10-14 ENCOUNTER — Encounter: Payer: Self-pay | Admitting: *Deleted

## 2013-10-14 DEATH — deceased

## 2013-10-23 ENCOUNTER — Encounter: Payer: Self-pay | Admitting: Internal Medicine

## 2013-11-04 ENCOUNTER — Encounter: Payer: Self-pay | Admitting: Internal Medicine

## 2014-06-24 ENCOUNTER — Encounter (HOSPITAL_COMMUNITY): Payer: Self-pay | Admitting: Internal Medicine

## 2015-03-03 IMAGING — CR DG CHEST 1V PORT
1 series · 1 of 1 positions shown · non-contrast
Comparison: DG CHEST 1V PORT dated 09/04/2013; DG CHEST 2 VIEW dated
09/02/2013; DG CHEST 2 VIEW dated 07/21/2013

CLINICAL DATA: Follow-up pulmonary edema.

EXAM:
PORTABLE CHEST - 1 VIEW

[AP]
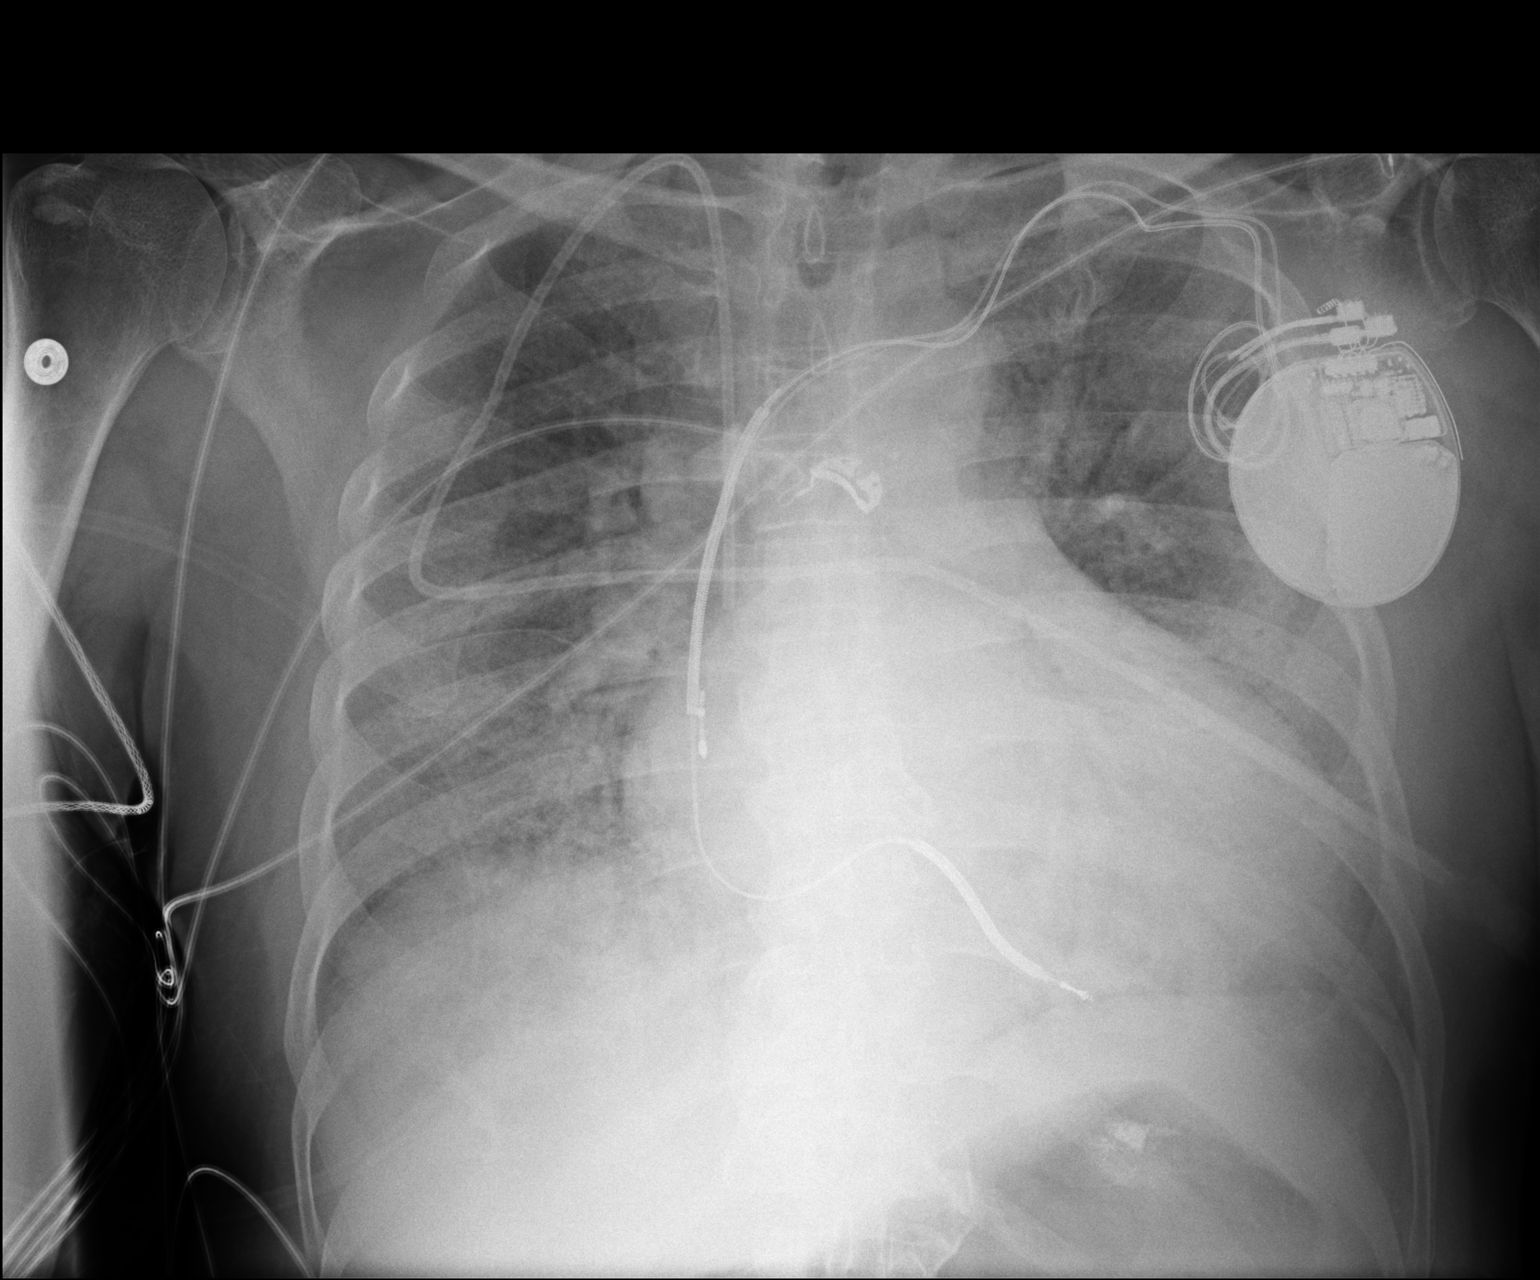

[1 of 1 positions shown; findings below may reference images not displayed]

FINDINGS: Interval improvement in the diffuse interstitial and airspace
pulmonary edema since the examination yesterday, though moderate to
severe airspace opacities persist throughout both lungs. No new
pulmonary parenchymal abnormality. Right jugular tunneled central
venous catheter tip projects over the lower SVC at or near the
cavoatrial junction, unchanged. Left subclavian pacing defibrillator
unchanged.
IMPRESSION: Improvement in the CHF since yesterday, though moderate to severe
interstitial and airspace pulmonary edema persists. No new
abnormalities.
